# Patient Record
Sex: Female | Born: 1950 | Race: White | Hispanic: No | Smoking: Never smoker
Health system: Southern US, Community
[De-identification: ages and names within clinical notes are randomized; demographics above are authoritative.]

## PROBLEM LIST (undated history)

## (undated) DIAGNOSIS — H3530 Unspecified macular degeneration: Secondary | ICD-10-CM

## (undated) DIAGNOSIS — K219 Gastro-esophageal reflux disease without esophagitis: Secondary | ICD-10-CM

## (undated) DIAGNOSIS — K449 Diaphragmatic hernia without obstruction or gangrene: Secondary | ICD-10-CM

## (undated) DIAGNOSIS — R05 Cough: Secondary | ICD-10-CM

## (undated) DIAGNOSIS — D510 Vitamin B12 deficiency anemia due to intrinsic factor deficiency: Secondary | ICD-10-CM

## (undated) DIAGNOSIS — E042 Nontoxic multinodular goiter: Secondary | ICD-10-CM

## (undated) DIAGNOSIS — G4730 Sleep apnea, unspecified: Secondary | ICD-10-CM

## (undated) DIAGNOSIS — G2581 Restless legs syndrome: Secondary | ICD-10-CM

## (undated) DIAGNOSIS — F329 Major depressive disorder, single episode, unspecified: Secondary | ICD-10-CM

## (undated) DIAGNOSIS — R053 Chronic cough: Secondary | ICD-10-CM

## (undated) DIAGNOSIS — M1990 Unspecified osteoarthritis, unspecified site: Secondary | ICD-10-CM

## (undated) DIAGNOSIS — I10 Essential (primary) hypertension: Secondary | ICD-10-CM

## (undated) DIAGNOSIS — K635 Polyp of colon: Secondary | ICD-10-CM

## (undated) DIAGNOSIS — Z8619 Personal history of other infectious and parasitic diseases: Secondary | ICD-10-CM

## (undated) DIAGNOSIS — F32A Depression, unspecified: Secondary | ICD-10-CM

## (undated) DIAGNOSIS — J45909 Unspecified asthma, uncomplicated: Secondary | ICD-10-CM

## (undated) HISTORY — PX: THYROID SURGERY: SHX805

## (undated) HISTORY — PX: OTHER SURGICAL HISTORY: SHX169

## (undated) HISTORY — DX: Chronic cough: R05.3

## (undated) HISTORY — DX: Nontoxic multinodular goiter: E04.2

## (undated) HISTORY — DX: Unspecified macular degeneration: H35.30

## (undated) HISTORY — DX: Personal history of other infectious and parasitic diseases: Z86.19

## (undated) HISTORY — DX: Vitamin B12 deficiency anemia due to intrinsic factor deficiency: D51.0

## (undated) HISTORY — PX: TONSILLECTOMY: SUR1361

## (undated) HISTORY — DX: Polyp of colon: K63.5

## (undated) HISTORY — DX: Cough: R05

## (undated) HISTORY — DX: Restless legs syndrome: G25.81

## (undated) HISTORY — DX: Diaphragmatic hernia without obstruction or gangrene: K44.9

## (undated) HISTORY — DX: Depression, unspecified: F32.A

## (undated) HISTORY — DX: Unspecified asthma, uncomplicated: J45.909

## (undated) HISTORY — DX: Gastro-esophageal reflux disease without esophagitis: K21.9

## (undated) HISTORY — PX: NASAL SINUS SURGERY: SHX719

## (undated) HISTORY — PX: TUBAL LIGATION: SHX77

## (undated) HISTORY — DX: Unspecified osteoarthritis, unspecified site: M19.90

## (undated) HISTORY — DX: Major depressive disorder, single episode, unspecified: F32.9

## (undated) NOTE — Telephone Encounter (Signed)
 Formatting of this note might be different from the original. Last routine office visit-07/03/24 Future appointment pending-none  Return in about 3 months (around 10/03/2024) for chronic conditions .  Electronically signed by Derinda Seip, MA at 09/14/2024  9:07 EST

---

## 2016-05-24 DIAGNOSIS — M2021 Hallux rigidus, right foot: Secondary | ICD-10-CM | POA: Insufficient documentation

## 2016-05-24 DIAGNOSIS — G5760 Lesion of plantar nerve, unspecified lower limb: Secondary | ICD-10-CM | POA: Insufficient documentation

## 2018-05-17 ENCOUNTER — Encounter: Payer: Self-pay | Admitting: Internal Medicine

## 2018-09-26 ENCOUNTER — Other Ambulatory Visit: Payer: Self-pay | Admitting: Pulmonary Disease

## 2018-09-26 DIAGNOSIS — R0609 Other forms of dyspnea: Secondary | ICD-10-CM

## 2018-09-26 DIAGNOSIS — R053 Chronic cough: Secondary | ICD-10-CM

## 2018-09-26 DIAGNOSIS — R0602 Shortness of breath: Secondary | ICD-10-CM

## 2018-09-26 DIAGNOSIS — R05 Cough: Secondary | ICD-10-CM

## 2018-10-03 ENCOUNTER — Ambulatory Visit (HOSPITAL_COMMUNITY): Payer: Medicare Other

## 2018-10-03 ENCOUNTER — Ambulatory Visit
Admission: RE | Admit: 2018-10-03 | Discharge: 2018-10-03 | Disposition: A | Discharge status: Home / Self Care | Payer: Medicare Other | Source: Ambulatory Visit | Attending: Pulmonary Disease | Admitting: Pulmonary Disease

## 2018-10-03 DIAGNOSIS — R0602 Shortness of breath: Secondary | ICD-10-CM | POA: Diagnosis not present

## 2018-10-03 DIAGNOSIS — R053 Chronic cough: Secondary | ICD-10-CM

## 2018-10-03 DIAGNOSIS — R0609 Other forms of dyspnea: Secondary | ICD-10-CM

## 2018-10-03 DIAGNOSIS — R05 Cough: Secondary | ICD-10-CM | POA: Diagnosis not present

## 2018-10-03 LAB — POCT I-STAT CREATININE: Creatinine, Ser: 1 mg/dL (ref 0.44–1.00)

## 2018-10-03 MED ORDER — METHACHOLINE 16 MG/ML NEB SOLN
2.0000 mL | Freq: Once | RESPIRATORY_TRACT | Status: AC
  Filled 2018-10-03: qty 2

## 2018-10-03 MED ORDER — METHACHOLINE 1 MG/ML NEB SOLN
2.0000 mL | Freq: Once | RESPIRATORY_TRACT | Status: AC
  Filled 2018-10-03: qty 2

## 2018-10-03 MED ORDER — METHACHOLINE 0.25 MG/ML NEB SOLN
2.0000 mL | Freq: Once | RESPIRATORY_TRACT | Status: AC
  Filled 2018-10-03: qty 2

## 2018-10-03 MED ORDER — ALBUTEROL SULFATE (2.5 MG/3ML) 0.083% IN NEBU
2.5000 mg | INHALATION_SOLUTION | Freq: Once | RESPIRATORY_TRACT | Status: AC
  Administered 2018-10-03: 2.5 mg via RESPIRATORY_TRACT
  Filled 2018-10-03: qty 3

## 2018-10-03 MED ORDER — METHACHOLINE 4 MG/ML NEB SOLN
2.0000 mL | Freq: Once | RESPIRATORY_TRACT | Status: AC
  Filled 2018-10-03: qty 2

## 2018-10-03 MED ORDER — IOPAMIDOL (ISOVUE-300) INJECTION 61%
75.0000 mL | Freq: Once | INTRAVENOUS | Status: AC | PRN
  Administered 2018-10-03: 75 mL via INTRAVENOUS

## 2018-10-03 MED ORDER — METHACHOLINE 0.0625 MG/ML NEB SOLN
2.0000 mL | Freq: Once | RESPIRATORY_TRACT | Status: AC
  Administered 2018-10-03: 0.125 mg via RESPIRATORY_TRACT
  Filled 2018-10-03: qty 2

## 2018-10-03 MED ORDER — SODIUM CHLORIDE 0.9 % IN NEBU
3.0000 mL | INHALATION_SOLUTION | Freq: Once | RESPIRATORY_TRACT | Status: AC
  Administered 2018-10-03: 3 mL via RESPIRATORY_TRACT

## 2018-10-05 ENCOUNTER — Ambulatory Visit: Payer: Self-pay

## 2018-10-12 ENCOUNTER — Encounter: Payer: Self-pay | Admitting: Internal Medicine

## 2018-10-12 ENCOUNTER — Ambulatory Visit: Payer: Medicare Other | Admitting: Internal Medicine

## 2018-10-12 VITALS — BP 118/62 | HR 92 | Temp 98.6°F | Ht 65.0 in | Wt 246.6 lb

## 2018-10-12 DIAGNOSIS — M791 Myalgia, unspecified site: Secondary | ICD-10-CM | POA: Diagnosis not present

## 2018-10-12 DIAGNOSIS — R748 Abnormal levels of other serum enzymes: Secondary | ICD-10-CM | POA: Insufficient documentation

## 2018-10-12 DIAGNOSIS — E559 Vitamin D deficiency, unspecified: Secondary | ICD-10-CM | POA: Insufficient documentation

## 2018-10-12 DIAGNOSIS — R7303 Prediabetes: Secondary | ICD-10-CM | POA: Diagnosis not present

## 2018-10-12 DIAGNOSIS — M2550 Pain in unspecified joint: Secondary | ICD-10-CM

## 2018-10-12 DIAGNOSIS — R011 Cardiac murmur, unspecified: Secondary | ICD-10-CM | POA: Diagnosis not present

## 2018-10-12 DIAGNOSIS — Z1283 Encounter for screening for malignant neoplasm of skin: Secondary | ICD-10-CM | POA: Diagnosis not present

## 2018-10-12 DIAGNOSIS — D510 Vitamin B12 deficiency anemia due to intrinsic factor deficiency: Secondary | ICD-10-CM | POA: Insufficient documentation

## 2018-10-12 DIAGNOSIS — Z1389 Encounter for screening for other disorder: Secondary | ICD-10-CM

## 2018-10-12 DIAGNOSIS — I10 Essential (primary) hypertension: Secondary | ICD-10-CM | POA: Diagnosis not present

## 2018-10-12 DIAGNOSIS — Z13818 Encounter for screening for other digestive system disorders: Secondary | ICD-10-CM

## 2018-10-12 DIAGNOSIS — G2581 Restless legs syndrome: Secondary | ICD-10-CM

## 2018-10-12 DIAGNOSIS — E785 Hyperlipidemia, unspecified: Secondary | ICD-10-CM | POA: Insufficient documentation

## 2018-10-12 DIAGNOSIS — J45909 Unspecified asthma, uncomplicated: Secondary | ICD-10-CM

## 2018-10-12 LAB — URIC ACID: Uric Acid, Serum: 7 mg/dL (ref 2.4–7.0)

## 2018-10-12 LAB — COMPREHENSIVE METABOLIC PANEL
ALT: 15 U/L (ref 0–35)
AST: 17 U/L (ref 0–37)
Albumin: 4.2 g/dL (ref 3.5–5.2)
Alkaline Phosphatase: 117 U/L (ref 39–117)
BUN: 25 mg/dL — ABNORMAL HIGH (ref 6–23)
CHLORIDE: 102 meq/L (ref 96–112)
CO2: 29 mEq/L (ref 19–32)
Calcium: 9.3 mg/dL (ref 8.4–10.5)
Creatinine, Ser: 0.98 mg/dL (ref 0.40–1.20)
GFR: 56.36 mL/min — ABNORMAL LOW (ref >60.00)
Glucose, Bld: 97 mg/dL (ref 70–99)
Potassium: 4.1 mEq/L (ref 3.5–5.1)
Sodium: 140 mEq/L (ref 135–145)
Total Bilirubin: 0.4 mg/dL (ref 0.2–1.2)
Total Protein: 6.8 g/dL (ref 6.0–8.3)

## 2018-10-12 LAB — HEMOGLOBIN A1C: Hgb A1c MFr Bld: 5.6 % (ref 4.6–6.5)

## 2018-10-12 LAB — SEDIMENTATION RATE: Sed Rate: 40 mm/hr — ABNORMAL HIGH (ref 0–30)

## 2018-10-12 LAB — GAMMA GT: GGT: 13 U/L (ref 7–51)

## 2018-10-12 LAB — C-REACTIVE PROTEIN: CRP: 1.4 mg/dL (ref 0.5–20.0)

## 2018-10-12 MED ORDER — CYANOCOBALAMIN 1000 MCG/ML IJ SOLN: 1000.0000 ug | Freq: Once | INTRAMUSCULAR | 3 refills | Status: AC

## 2018-10-12 NOTE — Progress Notes (Signed)
Pre visit review using our clinic review tool, if applicable. No additional management support is needed unless otherwise documented below in the visit note. 

## 2018-10-12 NOTE — Patient Instructions (Addendum)
Vitamin D3 5000 IU daily     Heart Murmur A heart murmur is an extra sound that is caused by chaotic blood flow through the valves of the heart. The murmur can be heard as a "hum" or "whoosh" sound when blood flows through the heart. There are two types of heart murmurs:  Innocent (benign) murmurs. Most people with this type of heart murmur do not have a heart problem. Many children have innocent heart murmurs. Your health care provider may suggest some basic tests to find out whether your murmur is an innocent murmur. If an innocent heart murmur is found, there is no need for further tests or treatment and no need to restrict activities or stop playing sports.  Abnormal murmurs. These types of murmurs can occur in children and adults. Abnormal murmurs may be a sign of a more serious heart condition, such as a heart defect present at birth (congenital defect) or heart valve disease. What are the causes?  The heart has four areas called chambers. Valves separate the upper and lower chambers from each other (tricuspid valve and mitral valve) and separate the lower chambers of the heart from pathways that lead away from the heart (aortic valve and pulmonary valve). Normally, the valves open to let blood flow through or out of your heart, and then they shut to keep the blood from flowing backward. This condition is caused by heart valves that are not working properly.  In children, abnormal heart murmurs are typically caused by congenital defects.  In adults, abnormal murmurs are usually caused by heart valve problems from disease, infection, or aging. This condition may also be caused by:  Pregnancy.  Fever.  Overactive thyroid gland.  Anemia.  Exercise.  Rapid growth spurts (in children). What are the signs or symptoms? Innocent murmurs do not cause symptoms, and many people with abnormal murmurs may not have symptoms. If symptoms do develop, they may include:  Shortness of  breath.  Blue coloring of the skin, especially on the fingertips.  Chest pain.  Palpitations, or feeling a fluttering or skipped heartbeat.  Fainting.  Persistent cough.  Getting tired much faster than expected.  Swelling in the abdomen, feet, or ankles. How is this diagnosed? This condition may be diagnosed during a routine physical or other exam. If your health care provider hears a murmur with a stethoscope, he or she will listen for:  Where the murmur is located in your heart.  How long the murmur lasts (duration).  When the murmur is heard during the heartbeat.  How loud the murmur is. This may help the health care provider figure out what is causing the murmur. You may be referred to a heart specialist (cardiologist). You may also have other tests, including:  Electrocardiogram (ECG or EKG). This test measures the electrical activity of your heart.  Echocardiogram. This test uses high frequency sound waves to make pictures of your heart.  MRI or chest X-ray.  Cardiac catheterization. This test looks at blood flow through the arteries around the heart. For children and adults who have an abnormal heart murmur and want to stay active, it is important to:  Complete testing.  Review test results.  Receive recommendations from your health care provider. If heart disease is present, it may not be safe to play or be active. How is this treated? Heart murmurs themselves do not need treatment. In some cases, a heart murmur may go away on its own. If an underlying problem or disease is  causing the murmur, you may need treatment. If treatment is needed, it will depend on the type and severity of the disease or heart problem causing the murmur. Treatment may include:  Medicine.  Surgery.  Dietary and lifestyle changes. Follow these instructions at home:  Talk with your health care provider before participating in sports or other activities that require a lot of effort  and energy (are strenuous).  Learn as much as possible about your condition and any related diseases. Ask your health care provider if you may be at risk for any medical emergencies.  Talk with your health care provider about what symptoms you should look out for.  It is up to you to get your test results. Ask your health care provider, or the department that is doing the test, when your results will be ready.  Keep all follow-up visits as told by your health care provider. This is important. Contact a health care provider if:  You are frequently short of breath.  You feel more tired than usual.  You are having a hard time keeping up with normal activities or fitness routines.  You have swelling in your ankles or feet.  You notice that your heart often beats irregularly.  You develop any new symptoms. Get help right away if:  You have chest pain.  You are having trouble breathing.  You feel light-headed or you pass out.  Your symptoms suddenly get worse. These symptoms may represent a serious problem that is an emergency. Do not wait to see if the symptoms will go away. Get medical help right away. Call your local emergency services (911 in the [place] Do not drive yourself to the hospital. Summary  Normally, the heart valves open to let blood flow through or out of your heart, and then they shut to keep the blood from flowing backward.  A heart murmur is caused by heart valves that are not working properly.  You may need treatment if an underlying problem or disease is causing the heart murmur. Treatment may include medicine, surgery, or dietary and lifestyle changes.  Talk with your health care provider before participating in sports or other activities that require a lot of effort and energy (are strenuous).  Talk with your health care provider about what symptoms you should watch out for. This information is not intended to replace advice given to you by your health care  provider. Make sure you discuss any questions you have with your health care provider. Document Released: 09/23/2004 Document Revised: 02/07/2018 Document Reviewed: 02/07/2018 Elsevier Interactive Patient Education  [employer]2019 Elsevier Inc.

## 2018-10-12 NOTE — Progress Notes (Addendum)
Chief Complaint  Patient presents with  . Establish Care   New pt moved from [place]  1. Pernicious anemia needs refill of B12  2. HTN controlled on bencar 40-12.5 hctz  3. Reviewed labs 05/17/18 vit D def, hyperCA 10.4, and elevated alkaline phos  4. C/o diffuse joint pain and aches and easy bruising new w/in the last 6 months  5. C/o left hand itchy lesion to skin and mid back lesion she wants skin checked out     Review of Systems  Constitutional: Negative for weight loss.  HENT: Negative for hearing loss.   Eyes: Negative for blurred vision.  Respiratory: Positive for cough. Negative for sputum production.   Cardiovascular: Negative for chest pain.  Gastrointestinal: Negative for abdominal pain.  Musculoskeletal: Positive for joint pain and myalgias.  Skin: Negative for rash.       +skin lesions    Neurological: Negative for headaches.  Endo/Heme/Allergies: Bruises/bleeds easily.  Psychiatric/Behavioral: Negative for depression.   Past Medical History:  Diagnosis Date  . Arthritis   . Asthma   . Chronic cough   . Colon polyps   . Depression   . GERD (gastroesophageal reflux disease)   . Hiatal hernia   . History of chicken pox   . Macular degeneration   . Multinodular goiter    FNA in past neg h/o thyroid cysts  . Pernicious anemia   . RLS (restless legs syndrome)    Past Surgical History:  Procedure Laterality Date  . hiatal hernia surgery     2018  . NASAL SINUS SURGERY     2018  . TONSILLECTOMY     1961  . TUBAL LIGATION     Family History  Problem Relation Age of Onset  . Heart disease Mother   . Heart disease Father   . Hypertension Father   . Depression Father   . Alcohol abuse Father   . Cancer Maternal Grandmother        uterine, skin   . Arthritis Maternal Grandmother    Social History   Socioeconomic History  . Marital status: Married    Spouse name: Not on file  . Number of children: Not on file  . Years of education: Not on file  .  Highest education level: Not on file  Occupational History  . Not on file  Social Needs  . Financial resource strain: Not on file  . Food insecurity:    Worry: Not on file    Inability: Not on file  . Transportation needs:    Medical: Not on file    Non-medical: Not on file  Tobacco Use  . Smoking status: Never Smoker  . Smokeless tobacco: Never Used  Substance and Sexual Activity  . Alcohol use: Not Currently  . Drug use: Not Currently  . Sexual activity: Yes    Partners: Female  Lifestyle  . Physical activity:    Days per week: Not on file    Minutes per session: Not on file  . Stress: Not on file  Relationships  . Social connections:    Talks on phone: Not on file    Gets together: Not on file    Attends religious service: Not on file    Active member of club or organization: Not on file    Attends meetings of clubs or organizations: Not on file    Relationship status: Not on file  . Intimate partner violence:    Fear of current or ex partner:  Not on file    Emotionally abused: Not on file    Physically abused: Not on file    Forced sexual activity: Not on file  Other Topics Concern  . Not on file  Social History Narrative   Married    2 kids    2 years college    Retired IT trainer    No guns    Wears seat belt, safe in relationship    Current Meds  Medication Sig  . albuterol (PROVENTIL HFA;VENTOLIN HFA) 108 (90 Base) MCG/ACT inhaler Inhale into the lungs.  . Fluticasone-Salmeterol (ADVAIR) 100-50 MCG/DOSE AEPB Inhale 1 puff into the lungs 2 (two) times daily.   Marland Kitchen guaiFENesin-codeine (ROBITUSSIN AC) 100-10 MG/5ML syrup Take 5 mLs by mouth 3 (three) times daily as needed for cough.  Marland Kitchen LORazepam (ATIVAN) 0.5 MG tablet Take 0.5 mg by mouth 2 (two) times daily as needed.   . meloxicam (MOBIC) 15 MG tablet Take 15 mg by mouth daily.   . Misc Natural Products (GLUCOSAMINE CHOND COMPLEX/MSM PO) Take by mouth 2 (two) times daily.  . Multiple  Vitamins-Minerals (VITEYES AREDS FORMULA PO) Take by mouth 2 (two) times daily.   Marland Kitchen olmesartan-hydrochlorothiazide (BENICAR HCT) 40-12.5 MG tablet Take by mouth daily.   . pramipexole (MIRAPEX) 1.5 MG tablet Take by mouth daily.   . TURMERIC CURCUMIN PO Take by mouth daily.  Marland Kitchen venlafaxine XR (EFFEXOR-XR) 150 MG 24 hr capsule Take 150 mg by mouth daily with breakfast.   . VITAMIN D, CHOLECALCIFEROL, PO Take by mouth.  . [DISCONTINUED] Cholecalciferol (VITAMIN D3 GUMMIES) 25 MCG (1000 UT) CHEW Chew by mouth.   No Known Allergies Recent Results (from the past 2160 hour(s))  I-STAT creatinine     Status: None   Collection Time: 10/03/18  8:37 AM  Result Value Ref Range   Creatinine, Ser 1.00 0.44 - 1.00 mg/dL  Comprehensive metabolic panel     Status: Abnormal   Collection Time: 10/12/18 11:46 AM  Result Value Ref Range   Sodium 140 135 - 145 mEq/L   Potassium 4.1 3.5 - 5.1 mEq/L   Chloride 102 96 - 112 mEq/L   CO2 29 19 - 32 mEq/L   Glucose, Bld 97 70 - 99 mg/dL   BUN 25 (H) 6 - 23 mg/dL   Creatinine, Ser 0.98 0.40 - 1.20 mg/dL   Total Bilirubin 0.4 0.2 - 1.2 mg/dL   Alkaline Phosphatase 117 39 - 117 U/L   AST 17 0 - 37 U/L   ALT 15 0 - 35 U/L   Total Protein 6.8 6.0 - 8.3 g/dL   Albumin 4.2 3.5 - 5.2 g/dL   Calcium 9.3 8.4 - 10.5 mg/dL   GFR 56.36 (L) >60.00 mL/min  Hemoglobin A1c     Status: None   Collection Time: 10/12/18 11:46 AM  Result Value Ref Range   Hgb A1c MFr Bld 5.6 4.6 - 6.5 %    Comment: Glycemic Control Guidelines for People with Diabetes:Non Diabetic:  <6%Goal of Therapy: <7%Additional Action Suggested:  >8%   Hepatitis C antibody     Status: None   Collection Time: 10/12/18 11:46 AM  Result Value Ref Range   Hepatitis C Ab NON-REACTIVE NON-REACTI   SIGNAL TO CUT-OFF 0.01 <1.00    Comment: . HCV antibody was non-reactive. There is no laboratory  evidence of HCV infection. . In most cases, no further action is required. However, if recent HCV exposure is  suspected, a test for HCV  RNA (test code [id number]) is suggested. . For additional information please refer to http://education.[facility].com/faq/FAQ22v1 (This link is being provided for informational/ educational purposes only.) .   Rheumatoid Factor     Status: None   Collection Time: 10/12/18 11:46 AM  Result Value Ref Range   Rhuematoid fact SerPl-aCnc <14 <14 IU/mL  Antinuclear Antib (ANA)     Status: None   Collection Time: 10/12/18 11:46 AM  Result Value Ref Range   Anti Nuclear Antibody(ANA) NEGATIVE NEGATIVE    Comment: ANA IFA is a first line screen for detecting the presence of up to approximately 150 autoantibodies in various autoimmune diseases. A negative ANA IFA result suggests an ANA-associated autoimmune disease is not present at this time, but is not definitive. If there is high clinical suspicion for Sjogren's syndrome, testing for anti-SS-A/Ro antibody should be considered. Anti-Jo-1 antibody should be considered for clinically suspected inflammatory myopathies. . AC-0: Negative . International Consensus on ANA Patterns (https://www.hernandez-brewer.com/) . For additional information, please refer to http://education.[facility].com/faq/FAQ177 (This link is being provided for informational/ educational purposes only.) .   Sedimentation rate     Status: Abnormal   Collection Time: 10/12/18 11:46 AM  Result Value Ref Range   Sed Rate 40 (H) 0 - 30 mm/hr  C-reactive protein     Status: None   Collection Time: 10/12/18 11:46 AM  Result Value Ref Range   CRP 1.4 0.5 - 70.9 mg/dL  Cyclic citrul peptide antibody, IgG ([facility])     Status: None   Collection Time: 10/12/18 11:46 AM  Result Value Ref Range   Cyclic Citrullin Peptide Ab <16 UNITS    Comment: Reference Range Negative:            <20 Weak Positive:       20-39 Moderate Positive:   40-59 Strong Positive:     >59 .   Uric acid     Status: None   Collection Time: 10/12/18 11:46  AM  Result Value Ref Range   Uric Acid, Serum 7.0 2.4 - 7.0 mg/dL  Gamma GT     Status: None   Collection Time: 10/12/18 11:46 AM  Result Value Ref Range   GGT 13 7 - 51 U/L  Urinalysis, Routine w reflex microscopic     Status: Abnormal   Collection Time: 10/12/18 11:47 AM  Result Value Ref Range   Color, Urine YELLOW YELLOW   APPearance CLEAR CLEAR   Specific Gravity, Urine 1.019 1.001 - 1.03   pH 6.5 5.0 - 8.0   Glucose, UA NEGATIVE NEGATIVE   Bilirubin Urine NEGATIVE NEGATIVE   Ketones, ur NEGATIVE NEGATIVE   Hgb urine dipstick NEGATIVE NEGATIVE   Protein, ur NEGATIVE NEGATIVE   Nitrite NEGATIVE NEGATIVE   Leukocytes,Ua 1+ (A) NEGATIVE   WBC, UA 0-5 0 - 5 /HPF   RBC / HPF NONE SEEN 0 - 2 /HPF   Squamous Epithelial / LPF 0-5 < OR = 5 /HPF   Bacteria, UA NONE SEEN NONE SEEN /HPF   Hyaline Cast NONE SEEN NONE SEEN /LPF   Objective  Body mass index is 41.04 kg/m. Wt Readings from Last 3 Encounters:  10/12/18 246 lb 9.6 oz (111.9 kg)   Temp Readings from Last 3 Encounters:  10/12/18 98.6 F (37 C) (Oral)   BP Readings from Last 3 Encounters:  10/12/18 118/62   Pulse Readings from Last 3 Encounters:  10/12/18 92    Physical Exam Vitals signs and nursing note reviewed.  Constitutional:  Appearance: Normal appearance. She is well-developed and well-groomed.  HENT:     Head: Normocephalic and atraumatic.     Nose: Nose normal.     Mouth/Throat:     Mouth: Mucous membranes are moist.     Pharynx: Oropharynx is clear.  Eyes:     Conjunctiva/sclera: Conjunctivae normal.     Pupils: Pupils are equal, round, and reactive to light.  Cardiovascular:     Rate and Rhythm: Normal rate and regular rhythm.     Heart sounds: Murmur present.  Pulmonary:     Effort: Pulmonary effort is normal.     Breath sounds: Normal breath sounds.  Skin:    General: Skin is warm and dry.  Neurological:     General: No focal deficit present.     Mental Status: She is alert and  oriented to person, place, and time. Mental status is at baseline.     Gait: Gait normal.  Psychiatric:        Attention and Perception: Attention and perception normal.        Mood and Affect: Mood and affect normal.        Speech: Speech normal.        Behavior: Behavior normal. Behavior is cooperative.        Thought Content: Thought content normal.        Cognition and Memory: Cognition and memory normal.        Judgment: Judgment normal.     Assessment   1. HTN controlled/HLD pt declines statin  TC 228, TG 135, LDL 138, HDL 63 05/17/18  2. Pernicious anemia B12 507 05/17/18  3.Hypercalcemia may be med related 10.4 05/17/18  4. Elevated alk phos 127 05/17/18  5.Diffuse joint aches and pain and muscle aches and pains, easy bruising could be age related r/o autoimmune  6. Asthma stable with chronic cough reviewed CT chest no lung etiology f/u [facility] pulm Dr. Lanney Gins  7.H/o RLS  8. HM 9. Cardiac murmur  Plan   1. Cont meds  Declines statin for now  2. Refilled B12 reviewed labs  3. Recheck calcium today  Consider further labs if elevated r/o parathyroid etiology could be hctz  4. Repeat Alk phos and ggt  5. Check RA labs to r/u autoimmune d/o  6.  Cont inhalers and f/u pulm  7.  Monitor  8.  Get records Vxs at [facility] had all vxs per pt   Out of age window pap  Colonoscopy get records Dr. Angie Fava [place]  Mammogram get records old PCP in [place] get records Dr. Achille Rich in [facility]  Referred dermatology check lesion left hand and back Vitamin D def 22.8 9/18 /19 rec take D3 5000 IU daily  TSH 0.678 05/17/18, CBC normal, CMET elevated AP and Ca, lipid done.   Get records old PCP [place] seen 07/25/18 and 05/17/18  -received h/o depression/anxiety/stress  -no significant findings in records   Mild asthma/allergies/chronic cough  9. Echo  Provider: Dr. Olivia Mackie McLean-Scocuzza-[facility]

## 2018-10-13 LAB — URINALYSIS, ROUTINE W REFLEX MICROSCOPIC
Bacteria, UA: NONE SEEN /HPF
Bilirubin Urine: NEGATIVE
Glucose, UA: NEGATIVE
Hgb urine dipstick: NEGATIVE
Hyaline Cast: NONE SEEN /LPF
Ketones, ur: NEGATIVE
Nitrite: NEGATIVE
Protein, ur: NEGATIVE
RBC / HPF: NONE SEEN /HPF (ref 0–2)
Specific Gravity, Urine: 1.019 (ref 1.001–1.03)
pH: 6.5 (ref 5.0–8.0)

## 2018-10-13 LAB — HEPATITIS C ANTIBODY
HEP C AB: NONREACTIVE
SIGNAL TO CUT-OFF: 0.01 (ref <1.00)

## 2018-10-13 LAB — RHEUMATOID FACTOR: Rheumatoid fact SerPl-aCnc: <14 IU/mL (ref <14)

## 2018-10-13 LAB — CYCLIC CITRUL PEPTIDE ANTIBODY, IGG: Cyclic Citrullin Peptide Ab: <16 UNITS

## 2018-10-13 LAB — ANA: Anti Nuclear Antibody(ANA): NEGATIVE

## 2018-10-19 ENCOUNTER — Telehealth: Payer: Self-pay | Admitting: Internal Medicine

## 2018-10-19 NOTE — Telephone Encounter (Signed)
Pt would like to get lab results from 10/12/18. Thank you!  Call pt @ [phone].

## 2018-11-03 ENCOUNTER — Encounter: Payer: Self-pay | Admitting: Internal Medicine

## 2018-11-07 ENCOUNTER — Ambulatory Visit
Admission: RE | Admit: 2018-11-07 | Discharge: 2018-11-07 | Disposition: A | Discharge status: Home / Self Care | Payer: Medicare Other | Source: Ambulatory Visit | Attending: Internal Medicine | Admitting: Internal Medicine

## 2018-11-07 ENCOUNTER — Other Ambulatory Visit: Payer: Self-pay

## 2018-11-07 DIAGNOSIS — R011 Cardiac murmur, unspecified: Secondary | ICD-10-CM

## 2018-11-07 DIAGNOSIS — I10 Essential (primary) hypertension: Secondary | ICD-10-CM | POA: Insufficient documentation

## 2018-11-07 DIAGNOSIS — I340 Nonrheumatic mitral (valve) insufficiency: Secondary | ICD-10-CM | POA: Insufficient documentation

## 2018-11-07 DIAGNOSIS — J45909 Unspecified asthma, uncomplicated: Secondary | ICD-10-CM | POA: Diagnosis not present

## 2018-11-07 DIAGNOSIS — K219 Gastro-esophageal reflux disease without esophagitis: Secondary | ICD-10-CM | POA: Insufficient documentation

## 2018-11-07 NOTE — Progress Notes (Signed)
*  PRELIMINARY RESULTS* Echocardiogram 2D Echocardiogram has been performed.  Sharon Cervantes 11/07/2018, 10:41 AM

## 2018-11-16 ENCOUNTER — Telehealth: Payer: Self-pay

## 2018-11-16 NOTE — Telephone Encounter (Signed)
Does she want to go to [place] only there do they have females heart docs?

## 2018-11-16 NOTE — Telephone Encounter (Signed)
Copied from CRM [id number]. Topic: General - Other >> Nov 16, 2018 11:45 AM Herby Abraham C wrote: Reason for CRM: pt called in to check the status of a cardiology referral. Pt says that she received a call from the office stating that she need a referral.   CB: [phone]

## 2018-11-17 NOTE — Telephone Encounter (Signed)
Left message to call back- question about preference with referral.

## 2018-11-17 NOTE — Telephone Encounter (Signed)
Left message for patient to return call back. [facility] may give results and obtain information.  

## 2018-11-17 NOTE — Telephone Encounter (Signed)
Pt called back. She wants to see a doctor in [place]. She is fine with seeing a female.

## 2018-11-19 ENCOUNTER — Encounter: Payer: Self-pay | Admitting: Internal Medicine

## 2018-11-19 ENCOUNTER — Other Ambulatory Visit: Payer: Self-pay | Admitting: Internal Medicine

## 2018-11-19 DIAGNOSIS — R011 Cardiac murmur, unspecified: Secondary | ICD-10-CM | POA: Insufficient documentation

## 2018-11-19 DIAGNOSIS — I340 Nonrheumatic mitral (valve) insufficiency: Secondary | ICD-10-CM

## 2018-11-20 NOTE — Telephone Encounter (Signed)
Pt states she would prefer seeing the female provider in [place].  Please call pt to advise.   [phone]

## 2018-11-21 NOTE — Telephone Encounter (Signed)
For cards referral pt wants female cardiology doctor with [facility] in [facility]  Please change referral for [place] female cardiologist in [place]   Thanks TSM

## 2018-11-22 NOTE — Telephone Encounter (Signed)
Referral has been changed to [facility] office and notated that she wants a female provider.

## 2018-11-23 NOTE — Telephone Encounter (Signed)
Patient calling and states that she will take any cardiologist, female or female, at either location. Apologizes for going back and forth.

## 2018-11-23 NOTE — Telephone Encounter (Signed)
This is it we are not going back and forth already requested in [place]   TMS

## 2018-11-27 ENCOUNTER — Encounter: Payer: Self-pay | Admitting: Internal Medicine

## 2018-12-27 ENCOUNTER — Encounter: Payer: Self-pay | Admitting: Internal Medicine

## 2018-12-27 ENCOUNTER — Ambulatory Visit (INDEPENDENT_AMBULATORY_CARE_PROVIDER_SITE_OTHER): Payer: Medicare Other | Admitting: Internal Medicine

## 2018-12-27 DIAGNOSIS — J309 Allergic rhinitis, unspecified: Secondary | ICD-10-CM

## 2018-12-27 DIAGNOSIS — I10 Essential (primary) hypertension: Secondary | ICD-10-CM | POA: Diagnosis not present

## 2018-12-27 DIAGNOSIS — I340 Nonrheumatic mitral (valve) insufficiency: Secondary | ICD-10-CM

## 2018-12-27 DIAGNOSIS — Z1231 Encounter for screening mammogram for malignant neoplasm of breast: Secondary | ICD-10-CM

## 2018-12-27 MED ORDER — MONTELUKAST SODIUM 10 MG PO TABS: 10.0000 mg | ORAL_TABLET | Freq: Every day | ORAL | 0 refills | Status: DC

## 2018-12-27 NOTE — Progress Notes (Signed)
Virtual Visit via Video Note Doxy  I connected with Sharon Cervantes  on 12/27/18 at  9:40 AM EDT by a video enabled telemedicine application and verified that I am speaking with the correct person using two identifiers.  Location patient: home Location provider:work Persons participating in the virtual visit: patient, provider  I discussed the limitations of evaluation and management by telemedicine and the availability of in person appointments. The patient expressed understanding and agreed to proceed.   HPI: 1. HTN controlled she stopped benicar hct due to pulm thought was causing cough but did notice difference and legs began to swell so resumed and BP controlled  2. C/o PND, allergies on xyzal 5 mg qd prn flonase, ns daughter is PA and thinks she needs to take singulair again and she cant remember if it helped her    ROS: See pertinent positives and negatives per HPI.  Past Medical History:  Diagnosis Date  . Arthritis   . Asthma   . Chronic cough   . Colon polyps   . Depression   . GERD (gastroesophageal reflux disease)   . Hiatal hernia   . History of chicken pox   . Macular degeneration   . Multinodular goiter    FNA in past neg h/o thyroid cysts  . Pernicious anemia   . RLS (restless legs syndrome)     Past Surgical History:  Procedure Laterality Date  . hiatal hernia surgery     2018  . NASAL SINUS SURGERY     2018  . THYROID SURGERY     cyst removal  . TONSILLECTOMY     1961  . TUBAL LIGATION      Family History  Problem Relation Age of Onset  . Heart disease Mother   . Heart disease Father   . Hypertension Father   . Depression Father   . Alcohol abuse Father   . Cancer Maternal Grandmother        uterine, skin   . Arthritis Maternal Grandmother     SOCIAL HX: lives alone worked at [employer]    Current Outpatient Medications:  .  albuterol (PROVENTIL HFA;VENTOLIN HFA) 108 (90 Base) MCG/ACT inhaler, Inhale into the lungs., Disp: , Rfl:  .   Fluticasone-Salmeterol (ADVAIR) 100-50 MCG/DOSE AEPB, Inhale 1 puff into the lungs 2 (two) times daily. , Disp: , Rfl:  .  guaiFENesin-codeine (ROBITUSSIN AC) 100-10 MG/5ML syrup, Take 5 mLs by mouth 3 (three) times daily as needed for cough., Disp: , Rfl:  .  LORazepam (ATIVAN) 0.5 MG tablet, Take 0.5 mg by mouth 2 (two) times daily as needed. , Disp: , Rfl:  .  meloxicam (MOBIC) 15 MG tablet, Take 15 mg by mouth daily. , Disp: , Rfl:  .  Misc Natural Products (GLUCOSAMINE CHOND COMPLEX/MSM PO), Take by mouth 2 (two) times daily., Disp: , Rfl:  .  montelukast (SINGULAIR) 10 MG tablet, Take 1 tablet (10 mg total) by mouth at bedtime., Disp: 30 tablet, Rfl: 0 .  Multiple Vitamins-Minerals (VITEYES AREDS FORMULA PO), Take by mouth 2 (two) times daily. , Disp: , Rfl:  .  olmesartan-hydrochlorothiazide (BENICAR HCT) 40-12.5 MG tablet, Take by mouth daily. , Disp: , Rfl:  .  pramipexole (MIRAPEX) 1.5 MG tablet, Take by mouth daily. , Disp: , Rfl:  .  TURMERIC CURCUMIN PO, Take by mouth daily., Disp: , Rfl:  .  venlafaxine XR (EFFEXOR-XR) 150 MG 24 hr capsule, Take 150 mg by mouth daily with breakfast. , Disp: ,  Rfl:  .  VITAMIN D, CHOLECALCIFEROL, PO, Take by mouth., Disp: , Rfl:  No current facility-administered medications for this visit.   Facility-Administered Medications Ordered in Other Visits:  .  [COMPLETED] sodium chloride 0.9 % nebulizer solution 3 mL, 3 mL, Nebulization, Once, 3 mL at 10/03/18 1130 **FOLLOWED BY** [COMPLETED] methacholine (PROVOCHOLINE) inhaler solution 0.125 mg, 2 mL, Inhalation, Once, 0.125 mg at 10/03/18 1139 **FOLLOWED BY** methacholine (PROVOCHOLINE) inhaler solution 0.5 mg, 2 mL, Inhalation, Once **FOLLOWED BY** methacholine (PROVOCHOLINE) inhaler solution 2 mg, 2 mL, Inhalation, Once **FOLLOWED BY** methacholine (PROVOCHOLINE) inhaler solution 8 mg, 2 mL, Inhalation, Once **FOLLOWED BY** methacholine (PROVOCHOLINE) inhaler solution 32 mg, 2 mL, Inhalation, Once **FOLLOWED  BY** [COMPLETED] albuterol (PROVENTIL) (2.5 MG/3ML) 0.083% nebulizer solution 2.5 mg, 2.5 mg, Nebulization, Once, Karna Christmas, Fuad, MD, 2.5 mg at 10/03/18 1146  EXAM:  VITALS per patient if applicable:  GENERAL: alert, oriented, appears well and in no acute distress  HEENT: atraumatic, conjunttiva clear, no obvious abnormalities on inspection of external nose and ears  NECK: normal movements of the head and neck  LUNGS: on inspection no signs of respiratory distress, breathing rate appears normal, no obvious gross SOB, gasping or wheezing  CV: no obvious cyanosis  MS: moves all visible extremities without noticeable abnormality  PSYCH/NEURO: pleasant and cooperative, no obvious depression or anxiety, speech and thought processing grossly intact  ASSESSMENT AND PLAN:  Discussed the following assessment and plan:  Allergic rhinitis, unspecified seasonality, unspecified trigger - Plan: montelukast (SINGULAIR) 10 MG tablet cont xyzal 5 mg qhs prn and NS and Flonase and f/u pulm upcoming   Mitral valve insufficiency, unspecified etiology f/u cards 01/01/2019 Dr. Lady Gary  HTN-cont meds controlled   HM Get records Vxs at [facility] [place] had all vxs per pt   Out of age window pap  Colonoscopy get records Dr. Cherlynn Polo [place] due 05/2019 need to send release again in future Mammogram get records old PCP in [place] due due 9 or 05/2019 ordered mammogram DEXA get records Dr. Frutoso Schatz in [facility] [place] need to sign release in the future  Referred dermatology check lesion left hand and back appt was canceled but not rescheduled 2/2 COVID Vitamin D def 22.8 9/18 /19 rec take D3 5000 IU daily  TSH 0.678 05/17/18, CBC normal, CMET elevated AP and Ca, lipid done.   Get records old PCP [facility] seen 07/25/18 and 05/17/18  -received h/o depression/anxiety/stress  -no significant findings in records   Mild asthma/allergies/chronic cough    I discussed the assessment and  treatment plan with the patient. The patient was provided an opportunity to ask questions and all were answered. The patient agreed with the plan and demonstrated an understanding of the instructions.   The patient was advised to call back or seek an in-person evaluation if the symptoms worsen or if the condition fails to improve as anticipated.  Time spent 20 minutes  Bevelyn Buckles, MD

## 2019-01-08 ENCOUNTER — Telehealth: Payer: Self-pay | Admitting: Internal Medicine

## 2019-01-08 ENCOUNTER — Other Ambulatory Visit: Payer: Self-pay | Admitting: Internal Medicine

## 2019-01-08 DIAGNOSIS — F32A Depression, unspecified: Secondary | ICD-10-CM

## 2019-01-08 DIAGNOSIS — F419 Anxiety disorder, unspecified: Secondary | ICD-10-CM

## 2019-01-08 DIAGNOSIS — R002 Palpitations: Secondary | ICD-10-CM | POA: Insufficient documentation

## 2019-01-08 DIAGNOSIS — F329 Major depressive disorder, single episode, unspecified: Secondary | ICD-10-CM

## 2019-01-08 MED ORDER — VENLAFAXINE HCL ER 150 MG PO CP24: 150.0000 mg | ORAL_CAPSULE | Freq: Every day | ORAL | 3 refills | Status: DC

## 2019-01-08 NOTE — Telephone Encounter (Signed)
The recommended dose of Pramipexole for restless legs is 0.75 mg daily total  Is she willing to reduce her dose from 1.5 mg daily ?

## 2019-01-08 NOTE — Telephone Encounter (Signed)
Copied from CRM [id number]. Topic: Quick Communication - Rx Refill/Question >> Jan 08, 2019 12:07 PM Louie Bun, Rosey Bath D wrote: Medication: pramipexole (MIRAPEX) 1.5 MG tablet,  venlafaxine XR (EFFEXOR-XR) 150 MG 24 hr capsule Has the patient contacted their pharmacy? No new meds (Agent: If no, request that the patient contact the pharmacy for the refill.) (Agent: If yes, when and what did the pharmacy advise?)  Preferred Pharmacy (with phone number or street name): [facility] [address] [place], [place] - [address]  Agent: Please be advised that RX refills may take up to 3 business days. We ask that you follow-up with your pharmacy.

## 2019-01-09 ENCOUNTER — Telehealth: Payer: Self-pay | Admitting: *Deleted

## 2019-01-09 ENCOUNTER — Other Ambulatory Visit: Payer: Self-pay | Admitting: Internal Medicine

## 2019-01-09 DIAGNOSIS — G2581 Restless legs syndrome: Secondary | ICD-10-CM

## 2019-01-09 MED ORDER — PRAMIPEXOLE DIHYDROCHLORIDE 0.75 MG PO TABS: 0.7500 mg | ORAL_TABLET | Freq: Every day | ORAL | 3 refills | Status: DC

## 2019-01-09 NOTE — Telephone Encounter (Signed)
Is she willing to go down on the dose to rec dose for restless legs 0.75? Instead of 1.5 mg ?   TMS

## 2019-01-09 NOTE — Telephone Encounter (Signed)
Copied from CRM [id number]. Topic: Quick Communication - Rx Refill/Question >> Jan 09, 2019 11:39 AM Percival Spanish wrote: Medication pramipexole (MIRAPEX) 1.5 MG tablet   Preferred  [facility] [address]   Agent: Please be advised that RX refills may take up to 3 business days. We ask that you follow-up with your pharmacy.

## 2019-01-09 NOTE — Telephone Encounter (Signed)
Patient is ok with the change.

## 2019-01-16 ENCOUNTER — Encounter: Payer: Self-pay | Admitting: Internal Medicine

## 2019-01-31 ENCOUNTER — Other Ambulatory Visit: Payer: Self-pay

## 2019-01-31 ENCOUNTER — Ambulatory Visit: Payer: Medicare Other | Admitting: Allergy

## 2019-01-31 ENCOUNTER — Encounter: Payer: Self-pay | Admitting: Allergy

## 2019-01-31 VITALS — BP 120/78 | HR 84 | Temp 97.9°F | Resp 16 | Ht 63.58 in | Wt 253.4 lb

## 2019-01-31 DIAGNOSIS — R05 Cough: Secondary | ICD-10-CM

## 2019-01-31 DIAGNOSIS — R053 Chronic cough: Secondary | ICD-10-CM

## 2019-01-31 NOTE — Progress Notes (Signed)
New Patient Note  RE: Sharon Cervantes MRN: [id number] DOB: 12-13-1950 Date of Office Visit: 01/31/2019  Referring provider: No ref. provider found Primary care provider: McLean-Scocuzza, Pasty Spillers, MD  Chief Complaint: Cough (coughing that is waking her up at night); Wheezing; and Asthma  History of Present Illness: I had the pleasure of seeing Sharon Cervantes for initial evaluation at the [facility] on 02/02/2019. She is a 68 y.o. female, who is self-referred here for the evaluation of coughing. Patient is accompanied by her significant other.   Coughing: She reports symptoms of chest tightness, shortness of breath, coughing with clear phlegm, wheezing, nocturnal awakenings for 15 years. Current medications include Pulmicort 0.5mg  nebulizer once a day, Advair 250 1 puff BID and Flovent 220 2 puffs BID for the past 3-4 months with no benefit. She reports using aerochamber with inhalers. She tried the following inhalers: see above list. Main triggers are heat, cold air, smells.  In the last month, frequency of symptoms: daily. Frequency of nocturnal symptoms: daily. Frequency of SABA use: few times/week. Interference with physical activity: yes. Sleep is disturbed. In the last 12 months, emergency room visits/urgent care visits/doctor office visits or hospitalizations due to respiratory issues: no. In the last 12 months, oral steroids courses: 3-4 with good benefit. Lifetime history of hospitalization for respiratory issues: no. Prior intubations: no. History of pneumonia: no. She was evaluated by allergist/pulmonologist in the past. Smoking exposure: no. Up to date with flu vaccine: yes. Up to date with pneumonia vaccine: yes.  History of reflux: patient does take omeprazole daily for this.  Patient follows with pulmonology.  Patient had a hiatal hernia and had surgery for this in the past. She follows with cardiologist for HTN and mitral valve insufficiency.   Allergies:  She  reports symptoms of PND, itchy eyes, nasal congestion/rhinorrhea after going. Symptoms have been going on for 15 years. The symptoms are present all year around.  She has used Singulair, Xyzal, Flonase, Astelin with minimal improvement in symptoms. Sinus infections: no. Previous work up includes: skin testing a few years ago which was positive to some items - records not available for review. She was on allergy injections for about 1 year with no benefit. Previous ENT evaluation: had sinus surgery last year in [place] with no benefit. Previous sinus imaging: last year. Last eye exam: within the past year. History of nasal polyps: no.  CT chest 10/03/2018 IMPRESSION: No acute cardiopulmonary disease. Several small thyroid nodules. This likely reflects multinodular goiter but could be further evaluated with elective thyroid ultrasound. Small to moderate-sized hiatal hernia.  Assessment and Plan: Sharon Cervantes is a 68 y.o. female with: Chronic cough Chronic daily cough for 15 years. Currently on Pulmicort, Advair, Flovent with no benefit. Tried Singulair, xyzal, Flonase, Astelin and allergy immunotherapy for 1 year with minimal benefit. She is being followed by pulmonology and cardiology. She had previous sinus surgery with no benefit and hiatal hernia surgery.   Patient was under the impression that I was allergy and ENT. Discussed that I am an allergy specialist and not an ENT. Came to an agreement to repeat the skin testing to see if there any additional allergens perhaps contributing to her symptoms. The most common causes of chronic cough include the following: upper airway cough syndrome (UACS) which is caused by variety of rhinitis conditions; asthma; gastroesophageal reflux disease (GERD); chronic bronchitis from cigarette smoking or other inhaled environmental irritants; non-asthmatic eosinophilic bronchitis; and bronchiectasis.  In prospective studies,  these conditions have accounted for up to 94%  of the causes of chronic cough in immunocompetent adults.  The history and physical examination suggest that her cough is most likely multifactorial. Patient left the office visit without completing testing or reviewing recommendations. She declined any further care after HPI, ROS and physical exam was obtained.  No follow-ups on file.  Other allergy screening: Food allergy: no Medication allergy: no Hymenoptera allergy: no Urticaria: no Eczema:no History of recurrent infections suggestive of immunodeficency: no  Diagnostics: Patient left the office without completing testing.   Past Medical History: Patient Active Problem List   Diagnosis Date Noted   Chronic cough 02/02/2019   Mitral valve regurgitation 11/19/2018   Cardiac murmur 11/19/2018   Vitamin D deficiency 10/12/2018   Essential hypertension 10/12/2018   Pernicious anemia 10/12/2018   Elevated alkaline phosphatase level 10/12/2018   Hypercalcemia 10/12/2018   Asthma 10/12/2018   RLS (restless legs syndrome) 10/12/2018   Hyperlipidemia 10/12/2018   Past Medical History:  Diagnosis Date   Arthritis    Asthma    Chronic cough    Colon polyps    Depression    GERD (gastroesophageal reflux disease)    Hiatal hernia    History of chicken pox    Macular degeneration    Multinodular goiter    FNA in past neg h/o thyroid cysts   Pernicious anemia    RLS (restless legs syndrome)    Past Surgical History: Past Surgical History:  Procedure Laterality Date   hiatal hernia surgery     2018   NASAL SINUS SURGERY     2018   THYROID SURGERY     cyst removal   TONSILLECTOMY     1961   TUBAL LIGATION     Medication List:  Current Outpatient Medications  Medication Sig Dispense Refill   albuterol (PROVENTIL HFA;VENTOLIN HFA) 108 (90 Base) MCG/ACT inhaler Inhale into the lungs.     budesonide (PULMICORT) 0.5 MG/2ML nebulizer solution Inhale into the lungs.     fluticasone (FLONASE)  50 MCG/ACT nasal spray Place into the nose.     fluticasone (FLOVENT HFA) 220 MCG/ACT inhaler Inhale into the lungs.     Fluticasone-Salmeterol (ADVAIR) 100-50 MCG/DOSE AEPB Inhale 1 puff into the lungs 2 (two) times daily.      hydrochlorothiazide (HYDRODIURIL) 12.5 MG tablet Take by mouth.     levocetirizine (XYZAL) 5 MG tablet Take 5 mg by mouth every evening.     LORazepam (ATIVAN) 0.5 MG tablet Take 0.5 mg by mouth 2 (two) times daily as needed.      meloxicam (MOBIC) 15 MG tablet Take 15 mg by mouth daily.      Misc Natural Products (GLUCOSAMINE CHOND COMPLEX/MSM PO) Take by mouth 2 (two) times daily.     montelukast (SINGULAIR) 10 MG tablet Take 1 tablet (10 mg total) by mouth at bedtime. 30 tablet 0   pramipexole (MIRAPEX) 1.5 MG tablet Take 1.5 mg by mouth 3 (three) times daily.     TURMERIC CURCUMIN PO Take by mouth daily.     venlafaxine XR (EFFEXOR-XR) 150 MG 24 hr capsule Take 1 capsule (150 mg total) by mouth daily with breakfast. 90 capsule 3   VITAMIN D, CHOLECALCIFEROL, PO Take by mouth.     No current facility-administered medications for this visit.    Facility-Administered Medications Ordered in Other Visits  Medication Dose Route Frequency Provider Last Rate Last Dose   methacholine (PROVOCHOLINE) inhaler solution 0.5 mg  2  mL Inhalation Once Vida RiggerAleskerov, Fuad, MD       Followed by   methacholine (PROVOCHOLINE) inhaler solution 2 mg  2 mL Inhalation Once Vida RiggerAleskerov, Fuad, MD       Followed by   methacholine (PROVOCHOLINE) inhaler solution 8 mg  2 mL Inhalation Once Vida RiggerAleskerov, Fuad, MD       Followed by   methacholine (PROVOCHOLINE) inhaler solution 32 mg  2 mL Inhalation Once Vida RiggerAleskerov, Fuad, MD       Allergies: No Known Allergies Social History: Social History   Socioeconomic History   Marital status: Married    Spouse name: Not on file   Number of children: Not on file   Years of education: Not on file   Highest education level: Not on file    Occupational History   Not on file  Social Needs   Financial resource strain: Not on file   Food insecurity:    Worry: Not on file    Inability: Not on file   Transportation needs:    Medical: Not on file    Non-medical: Not on file  Tobacco Use   Smoking status: Never Smoker   Smokeless tobacco: Never Used  Substance and Sexual Activity   Alcohol use: Not Currently   Drug use: Not Currently   Sexual activity: Yes    Partners: Female  Lifestyle   Physical activity:    Days per week: Not on file    Minutes per session: Not on file   Stress: Not on file  Relationships   Social connections:    Talks on phone: Not on file    Gets together: Not on file    Attends religious service: Not on file    Active member of club or organization: Not on file    Attends meetings of clubs or organizations: Not on file    Relationship status: Not on file  Other Topics Concern   Not on file  Social History Narrative   Married    2 kids    2 years college    Retired [employer] office    No guns    Wears seat belt, safe in relationship    Lives in a apartment. Smoking: denies Occupation: retired   Landscape architectnvironmental HistorySurveyor, minerals: Water Damage/mildew in the house: no Engineer, civil (consulting)Carpet in the family room: no Carpet in the bedroom: yes Heating: electric Cooling: central Pet: yes 2 dogs x 8 yrs, 6 months  Family History: Family History  Problem Relation Age of Onset   Heart disease Mother    Heart disease Father    Hypertension Father    Depression Father    Alcohol abuse Father    Cancer Maternal Grandmother        uterine, skin    Arthritis Maternal Grandmother    Problem                               Relation Asthma                                   Son  Eczema                                No  Food allergy  No  Allergic rhino conjunctivitis     Son   Review of Systems  Constitutional: Negative for appetite change, chills, fever and  unexpected weight change.  HENT: Positive for postnasal drip. Negative for congestion and rhinorrhea.   Eyes: Negative for itching.  Respiratory: Positive for cough. Negative for chest tightness, shortness of breath and wheezing.   Cardiovascular: Negative for chest pain.  Gastrointestinal: Negative for abdominal pain.  Genitourinary: Negative for difficulty urinating.  Skin: Negative for rash.  Allergic/Immunologic: Positive for environmental allergies. Negative for food allergies.  Neurological: Negative for headaches.   Objective: BP 120/78 (BP Location: Left Arm, Patient Position: Sitting, Cuff Size: Normal)    Pulse 84    Temp 97.9 F (36.6 C) (Oral)    Resp 16    Ht 5' 3.58" (1.615 m)    Wt 253 lb 6.4 oz (114.9 kg)    SpO2 96%    BMI 44.07 kg/m  Body mass index is 44.07 kg/m. Physical Exam  Constitutional: She is oriented to person, place, and time. She appears well-developed and well-nourished.  HENT:  Head: Normocephalic and atraumatic.  Right Ear: External ear normal.  Left Ear: External ear normal.  Nose: Nose normal.  Mouth/Throat: Oropharynx is clear and moist.  Eyes: Conjunctivae and EOM are normal.  Neck: Neck supple.  Cardiovascular: Normal rate, regular rhythm and normal heart sounds. Exam reveals no gallop and no friction rub.  No murmur heard. Pulmonary/Chest: Effort normal and breath sounds normal. She has no wheezes. She has no rales.  Abdominal: Soft. Bowel sounds are normal. There is no abdominal tenderness.  Lymphadenopathy:    She has no cervical adenopathy.  Neurological: She is alert and oriented to person, place, and time.  Skin: Skin is warm. No rash noted.  Psychiatric: She has a normal mood and affect. Her behavior is normal.  Nursing note and vitals reviewed.  Sincerely,  Wyline Mood, DO Allergy & Immunology  [facility] office: [phone] [facility] office: [phone]

## 2019-02-02 ENCOUNTER — Encounter: Payer: Self-pay | Admitting: Cardiovascular Disease

## 2019-02-02 ENCOUNTER — Encounter: Payer: Self-pay | Admitting: Allergy

## 2019-02-02 DIAGNOSIS — R05 Cough: Secondary | ICD-10-CM | POA: Insufficient documentation

## 2019-02-02 DIAGNOSIS — R053 Chronic cough: Secondary | ICD-10-CM | POA: Insufficient documentation

## 2019-02-02 NOTE — Assessment & Plan Note (Signed)
Chronic daily cough for 15 years. Currently on Pulmicort, Advair, Flovent with no benefit. Tried Singulair, xyzal, Flonase, Astelin and allergy immunotherapy for 1 year with minimal benefit. She is being followed by pulmonology and cardiology. She had previous sinus surgery with no benefit and hiatal hernia surgery.   Patient was under the impression that I was allergy and ENT. Discussed that I am an allergy specialist and not an ENT. Came to an agreement to repeat the skin testing to see if there any additional allergens perhaps contributing to her symptoms. The most common causes of chronic cough include the following: upper airway cough syndrome (UACS) which is caused by variety of rhinitis conditions; asthma; gastroesophageal reflux disease (GERD); chronic bronchitis from cigarette smoking or other inhaled environmental irritants; non-asthmatic eosinophilic bronchitis; and bronchiectasis.  In prospective studies, these conditions have accounted for up to 94% of the causes of chronic cough in immunocompetent adults.  The history and physical examination suggest that her cough is most likely multifactorial. Patient left the office visit without completing testing or reviewing recommendations. She declined any further care after HPI, ROS and physical exam was obtained.

## 2019-02-07 DIAGNOSIS — G4733 Obstructive sleep apnea (adult) (pediatric): Secondary | ICD-10-CM | POA: Insufficient documentation

## 2019-05-17 ENCOUNTER — Encounter: Payer: Self-pay | Admitting: Internal Medicine

## 2019-05-21 ENCOUNTER — Encounter: Payer: Self-pay | Admitting: Family Medicine

## 2019-05-21 ENCOUNTER — Ambulatory Visit (INDEPENDENT_AMBULATORY_CARE_PROVIDER_SITE_OTHER): Payer: Medicare Other | Admitting: Family Medicine

## 2019-05-21 ENCOUNTER — Other Ambulatory Visit: Payer: Self-pay

## 2019-05-21 VITALS — BP 112/64 | HR 92 | Temp 96.4°F | Resp 18 | Ht 65.0 in | Wt 270.4 lb

## 2019-05-21 DIAGNOSIS — M62838 Other muscle spasm: Secondary | ICD-10-CM

## 2019-05-21 DIAGNOSIS — M545 Low back pain, unspecified: Secondary | ICD-10-CM

## 2019-05-21 MED ORDER — METHYLPREDNISOLONE ACETATE 40 MG/ML IJ SUSP
40.0000 mg | Freq: Once | INTRAMUSCULAR | Status: AC
  Administered 2019-05-21: 40 mg via INTRAMUSCULAR

## 2019-05-21 MED ORDER — KETOROLAC TROMETHAMINE 60 MG/2ML IM SOLN
60.0000 mg | Freq: Once | INTRAMUSCULAR | Status: AC
  Administered 2019-05-21: 12:00:00 60 mg via INTRAMUSCULAR

## 2019-05-21 MED ORDER — CYCLOBENZAPRINE HCL 5 MG PO TABS: 5.0000 mg | ORAL_TABLET | Freq: Three times a day (TID) | ORAL | 1 refills | Status: DC | PRN

## 2019-05-21 NOTE — Progress Notes (Signed)
Subjective:    Patient ID: Sharon Cervantes, female    DOB: 1951/07/11, 68 y.o.   MRN: [id number]  HPI   Patient presents to clinic complaining of bilateral low back pain.  States she notices it mainly when walking and changing position from sit to stand.  Denies any known injury or any falls.  Describes it like a muscle ache/muscle spasm.  Denies lower extremity numbness, weakness, loss of bowel or bladder control, fever or chills or any saddle anesthesia.  Patient Active Problem List   Diagnosis Date Noted  . Chronic cough 02/02/2019  . Mitral valve regurgitation 11/19/2018  . Cardiac murmur 11/19/2018  . Vitamin D deficiency 10/12/2018  . Essential hypertension 10/12/2018  . Pernicious anemia 10/12/2018  . Elevated alkaline phosphatase level 10/12/2018  . Hypercalcemia 10/12/2018  . Asthma 10/12/2018  . RLS (restless legs syndrome) 10/12/2018  . Hyperlipidemia 10/12/2018   Social History   Tobacco Use  . Smoking status: Never Smoker  . Smokeless tobacco: Never Used  Substance Use Topics  . Alcohol use: Not Currently   Review of Systems  Constitutional: Negative for chills, fatigue and fever.  HENT: Negative for congestion, ear pain, sinus pain and sore throat.   Eyes: Negative.   Respiratory: Negative for cough, shortness of breath and wheezing.   Cardiovascular: Negative for chest pain, palpitations and leg swelling.  Gastrointestinal: Negative for abdominal pain, diarrhea, nausea and vomiting.  Genitourinary: Negative for dysuria, frequency and urgency.  Musculoskeletal: +low back pain Skin: Negative for color change, pallor and rash.  Neurological: Negative for syncope, light-headedness and headaches.  Psychiatric/Behavioral: The patient is not nervous/anxious.       Objective:   Physical Exam Vitals signs and nursing note reviewed.  Constitutional:      General: She is not in acute distress.    Appearance: She is obese. She is not ill-appearing, toxic-appearing  or diaphoretic.  HENT:     Head: Normocephalic and atraumatic.  Cardiovascular:     Rate and Rhythm: Normal rate and regular rhythm.     Heart sounds: Normal heart sounds.  Pulmonary:     Effort: Pulmonary effort is normal.     Breath sounds: Normal breath sounds.  Musculoskeletal:       Back:     Comments: Location of back pain represented by Green square on diagram.  Quadricep strength equal and strong.  Pulling pain with straight leg raises and bending forward.  Dorsi plantarflexion equal and strong.  Skin:    General: Skin is warm and dry.     Coloration: Skin is not jaundiced or pale.     Findings: No bruising or erythema.  Neurological:     General: No focal deficit present.     Mental Status: She is alert and oriented to person, place, and time.     Motor: No weakness.     Gait: Gait normal.  Psychiatric:        Mood and Affect: Mood normal.        Behavior: Behavior normal.       Today's Vitals   05/21/19 1128  BP: 112/64  Pulse: 92  Resp: 18  Temp: (!) 96.4 F (35.8 C)  TempSrc: Temporal  SpO2: 96%  Weight: 270 lb 6.4 oz (122.7 kg)  Height: 5\' 5"  (1.651 m)   Body mass index is 45 kg/m.     Assessment & Plan:   Patient will get IM Toradol and methylprednisolone x1 to help jump start pain relief.  She will use Flexeril as needed for muscle relaxation.  Discussed topical rubs like BenGay, Biofreeze and heating pads to help reduce pain.  Also recommended stretching and range of motion exercises.  Advised to call office right away if pain worsens or she develops any alarm symptoms including lower extremity numbness, loss of bowel or bladder control, saddle anesthesia.  1. Acute bilateral low back pain without sciatica  - cyclobenzaprine (FLEXERIL) 5 MG tablet; Take 1 tablet (5 mg total) by mouth 3 (three) times daily as needed for muscle spasms.  Dispense: 30 tablet; Refill: 1  2. Muscle spasm  - cyclobenzaprine (FLEXERIL) 5 MG tablet; Take 1 tablet (5 mg  total) by mouth 3 (three) times daily as needed for muscle spasms.  Dispense: 30 tablet; Refill: 1 - ketorolac (TORADOL) injection 60 mg - methylPREDNISolone acetate (DEPO-MEDROL) injection 40 mg   She will keep regular appts with PCP. She will return to clinic sooner if any issues arise.

## 2019-05-21 NOTE — Patient Instructions (Signed)

## 2019-07-06 ENCOUNTER — Ambulatory Visit: Payer: Medicare Other | Admitting: Internal Medicine

## 2019-07-17 DIAGNOSIS — I272 Pulmonary hypertension, unspecified: Secondary | ICD-10-CM | POA: Insufficient documentation

## 2019-08-22 ENCOUNTER — Other Ambulatory Visit: Payer: Self-pay | Admitting: Orthopedic Surgery

## 2019-08-22 DIAGNOSIS — M1711 Unilateral primary osteoarthritis, right knee: Secondary | ICD-10-CM

## 2019-09-25 ENCOUNTER — Other Ambulatory Visit: Payer: Self-pay | Admitting: Internal Medicine

## 2019-09-25 DIAGNOSIS — J309 Allergic rhinitis, unspecified: Secondary | ICD-10-CM

## 2019-09-26 MED ORDER — MONTELUKAST SODIUM 10 MG PO TABS: 10.0000 mg | ORAL_TABLET | Freq: Every day | ORAL | 0 refills | Status: DC

## 2019-10-01 ENCOUNTER — Encounter: Payer: Self-pay | Admitting: Emergency Medicine

## 2019-10-01 ENCOUNTER — Other Ambulatory Visit: Payer: Self-pay

## 2019-10-01 ENCOUNTER — Ambulatory Visit
Admission: EM | Admit: 2019-10-01 | Discharge: 2019-10-01 | Disposition: A | Discharge status: Home / Self Care | Payer: Medicare Other

## 2019-10-01 DIAGNOSIS — M62838 Other muscle spasm: Secondary | ICD-10-CM | POA: Diagnosis not present

## 2019-10-01 MED ORDER — CYCLOBENZAPRINE HCL 10 MG PO TABS: 10.0000 mg | ORAL_TABLET | Freq: Two times a day (BID) | ORAL | 0 refills | Status: DC | PRN

## 2019-10-01 MED ORDER — IBUPROFEN 800 MG PO TABS: 800.0000 mg | ORAL_TABLET | Freq: Three times a day (TID) | ORAL | 0 refills | Status: DC | PRN

## 2019-10-01 NOTE — Discharge Instructions (Signed)
Take the prescribed ibuprofen as needed for your pain.  Take the muscle relaxer Flexeril as needed for muscle spasm; do not drive, operate machinery, or drink alcohol with this medication as it may make you drowsy.    Follow up with your primary care provider if your symptoms are not improving.     

## 2019-10-01 NOTE — ED Provider Notes (Signed)
Roderic Palau    CSN: [id number] Arrival date & time: 10/01/19  1034      History   Chief Complaint Chief Complaint  Patient presents with  . Neck Pain    right    HPI Sharon Cervantes is a 69 y.o. female.   Patient presents with 3-day history of right side neck pain.  She describes this as a "crack" and feels like a muscle spasm; 8/10; worse with movement, improves with rest.  No falls or injury.  She denies weakness, numbness, fever, chills, rash, sore throat, cough, shortness of breath, vomiting, diarrhea, or other symptoms.  Treatment attempted at home with ibuprofen and Flexeril but has run out of the Flexeril.    The history is provided by the patient.    Past Medical History:  Diagnosis Date  . Arthritis   . Asthma   . Chronic cough   . Colon polyps   . Depression   . GERD (gastroesophageal reflux disease)   . Hiatal hernia   . History of chicken pox   . Macular degeneration   . Multinodular goiter    FNA in past neg h/o thyroid cysts  . Pernicious anemia   . RLS (restless legs syndrome)     Patient Active Problem List   Diagnosis Date Noted  . Chronic cough 02/02/2019  . Mitral valve regurgitation 11/19/2018  . Cardiac murmur 11/19/2018  . Vitamin D deficiency 10/12/2018  . Essential hypertension 10/12/2018  . Pernicious anemia 10/12/2018  . Elevated alkaline phosphatase level 10/12/2018  . Hypercalcemia 10/12/2018  . Asthma 10/12/2018  . RLS (restless legs syndrome) 10/12/2018  . Hyperlipidemia 10/12/2018    Past Surgical History:  Procedure Laterality Date  . hiatal hernia surgery     2018  . NASAL SINUS SURGERY     2018  . THYROID SURGERY     cyst removal  . TONSILLECTOMY     1961  . TUBAL LIGATION      OB History   No obstetric history on file.      Home Medications    Prior to Admission medications   Medication Sig Start Date End Date Taking? Authorizing Provider  albuterol (PROVENTIL HFA;VENTOLIN HFA) 108 (90 Base) MCG/ACT  inhaler Inhale into the lungs.   Yes [provider]  budesonide (PULMICORT) 0.5 MG/2ML nebulizer solution Inhale into the lungs. 01/10/19 01/10/20 Yes [provider]  fluticasone (FLONASE) 50 MCG/ACT nasal spray Place into the nose. 10/23/18 10/23/19 Yes [provider]  fluticasone (FLOVENT HFA) 220 MCG/ACT inhaler Inhale into the lungs. 10/23/18 10/23/19 Yes [provider]  Fluticasone-Salmeterol (ADVAIR) 100-50 MCG/DOSE AEPB Inhale 1 puff into the lungs 2 (two) times daily.    Yes [provider]  hydrochlorothiazide (HYDRODIURIL) 12.5 MG tablet Take by mouth. 01/31/19 01/31/20 Yes [provider]  montelukast (SINGULAIR) 10 MG tablet Take 1 tablet (10 mg total) by mouth at bedtime. 09/26/19  Yes McLean-Scocuzza, Nino Glow, MD  Multiple Vitamins-Minerals (ICAPS AREDS 2 PO) Take 1 capsule by mouth 2 (two) times daily.   Yes [provider]  olmesartan-hydrochlorothiazide (BENICAR HCT) 40-12.5 MG tablet Take 1 tablet by mouth daily. 05/28/19  Yes [provider]  pramipexole (MIRAPEX) 1.5 MG tablet Take 1.5 mg by mouth 3 (three) times daily.   Yes [provider]  venlafaxine XR (EFFEXOR-XR) 150 MG 24 hr capsule Take 1 capsule (150 mg total) by mouth daily with breakfast. 01/08/19  Yes McLean-Scocuzza, Nino Glow, MD  VITAMIN A PO  Take 1 tablet by mouth daily.   Yes [provider]  cyclobenzaprine (FLEXERIL) 10 MG tablet Take 1 tablet (10 mg total) by mouth 2 (two) times daily as needed for muscle spasms. 10/01/19   Mickie Bail, NP  ibuprofen (ADVIL) 800 MG tablet Take 1 tablet (800 mg total) by mouth every 8 (eight) hours as needed. 10/01/19   Mickie Bail, NP  levocetirizine (XYZAL) 5 MG tablet Take 5 mg by mouth every evening.    [provider]  LORazepam (ATIVAN) 0.5 MG tablet Take 0.5 mg by mouth 2 (two) times daily as needed.     [provider]  meloxicam (MOBIC) 15 MG tablet Take 15 mg by mouth  daily.     [provider]  Misc Natural Products (GLUCOSAMINE CHOND COMPLEX/MSM PO) Take by mouth 2 (two) times daily.    [provider]  TURMERIC CURCUMIN PO Take by mouth daily.    [provider]  VITAMIN D, CHOLECALCIFEROL, PO Take by mouth.    [provider]    Family History Family History  Problem Relation Age of Onset  . Heart disease Mother   . Heart disease Father   . Hypertension Father   . Depression Father   . Alcohol abuse Father   . Cancer Maternal Grandmother        uterine, skin   . Arthritis Maternal Grandmother     Social History Social History   Tobacco Use  . Smoking status: Never Smoker  . Smokeless tobacco: Never Used  Substance Use Topics  . Alcohol use: Not Currently  . Drug use: Not Currently     Allergies   Patient has no known allergies.   Review of Systems Review of Systems  Constitutional: Negative for chills and fever.  HENT: Negative for ear pain and sore throat.   Eyes: Negative for pain and visual disturbance.  Respiratory: Negative for cough and shortness of breath.   Cardiovascular: Negative for chest pain and palpitations.  Gastrointestinal: Negative for abdominal pain and vomiting.  Genitourinary: Negative for dysuria and hematuria.  Musculoskeletal: Positive for neck pain. Negative for arthralgias and back pain.  Skin: Negative for color change and rash.  Neurological: Negative for seizures, syncope, weakness and numbness.  All other systems reviewed and are negative.    Physical Exam Triage Vital Signs ED Triage Vitals  Enc Vitals Group     BP      Pulse      Resp      Temp      Temp src      SpO2      Weight      Height      Head Circumference      Peak Flow      Pain Score      Pain Loc      Pain Edu?      Excl. in GC?    No data found.  Updated Vital Signs BP 130/84 (BP Location: Left Arm)   Pulse 79   Temp 98.1 F (36.7 C) (Oral)   Resp 18   Ht 5\' 5"  (1.651  m)   Wt 265 lb (120.2 kg)   SpO2 97%   BMI 44.10 kg/m   Visual Acuity Right Eye Distance:   Left Eye Distance:   Bilateral Distance:    Right Eye Near:   Left Eye Near:    Bilateral Near:     Physical Exam Vitals and nursing  note reviewed.  Constitutional:      General: She is not in acute distress.    Appearance: She is well-developed. She is not ill-appearing.  HENT:     Head: Normocephalic and atraumatic.     Right Ear: Tympanic membrane normal.     Left Ear: Tympanic membrane normal.     Nose: Nose normal.     Mouth/Throat:     Mouth: Mucous membranes are moist.     Pharynx: Oropharynx is clear.  Eyes:     Conjunctiva/sclera: Conjunctivae normal.  Cardiovascular:     Rate and Rhythm: Normal rate and regular rhythm.     Heart sounds: No murmur.  Pulmonary:     Effort: Pulmonary effort is normal. No respiratory distress.     Breath sounds: Normal breath sounds. No wheezing or rhonchi.  Abdominal:     General: Bowel sounds are normal.     Palpations: Abdomen is soft.     Tenderness: There is no abdominal tenderness. There is no guarding or rebound.  Musculoskeletal:        General: Tenderness present. No swelling, deformity or signs of injury. Normal range of motion.     Cervical back: Neck supple.     Comments: Right trapezius muscle tender to palpation.    Skin:    General: Skin is warm and dry.     Capillary Refill: Capillary refill takes less than 2 seconds.     Findings: No bruising, erythema, lesion or rash.  Neurological:     General: No focal deficit present.     Mental Status: She is alert and oriented to person, place, and time.     Sensory: No sensory deficit.     Motor: No weakness.     Gait: Gait normal.  Psychiatric:        Mood and Affect: Mood normal.        Behavior: Behavior normal.      UC Treatments / Results  Labs (all labs ordered are listed, but only abnormal results are displayed) Labs Reviewed - No data to display  EKG    Radiology No results found.  Procedures Procedures (including critical care time)  Medications Ordered in UC Medications - No data to display  Initial Impression / Assessment and Plan / UC Course  I have reviewed the triage vital signs and the nursing notes.  Pertinent labs & imaging results that were available during my care of the patient were reviewed by me and considered in my medical decision making (see chart for details).    Muscle spasm of the right trapezius.  Treating with ibuprofen and Flexeril.  Precautions for drowsiness with Flexeril discussed with patient.  Instructed her to follow-up with her PCP if her symptoms are not improving.  Patient agrees to plan of care.     Final Clinical Impressions(s) / UC Diagnoses   Final diagnoses:  Muscle spasm     Discharge Instructions     Take the prescribed ibuprofen as needed for your pain.  Take the muscle relaxer Flexeril as needed for muscle spasm; do not drive, operate machinery, or drink alcohol with this medication as it may make you drowsy.    Follow-up with your primary care provider if your symptoms are not improving.         ED Prescriptions    Medication Sig Dispense Auth. Provider   ibuprofen (ADVIL) 800 MG tablet Take 1 tablet (800 mg total) by mouth every 8 (eight) hours as needed.  21 tablet Mickie Bail, NP   cyclobenzaprine (FLEXERIL) 10 MG tablet Take 1 tablet (10 mg total) by mouth 2 (two) times daily as needed for muscle spasms. 20 tablet Mickie Bail, NP     I have reviewed the PDMP during this encounter.   Mickie Bail, NP 10/01/19 1100

## 2019-10-01 NOTE — ED Triage Notes (Signed)
Patient in today c/o right sided neck pain x 3 days. No injury noted. Patient has tried OTC Ibuprofen and Flexeril.

## 2019-10-10 ENCOUNTER — Encounter: Payer: Medicare Other | Attending: Pulmonary Disease | Admitting: *Deleted

## 2019-10-10 ENCOUNTER — Other Ambulatory Visit: Payer: Self-pay

## 2019-10-10 DIAGNOSIS — J4550 Severe persistent asthma, uncomplicated: Secondary | ICD-10-CM

## 2019-10-10 DIAGNOSIS — J45909 Unspecified asthma, uncomplicated: Secondary | ICD-10-CM | POA: Insufficient documentation

## 2019-10-10 DIAGNOSIS — I272 Pulmonary hypertension, unspecified: Secondary | ICD-10-CM | POA: Insufficient documentation

## 2019-10-10 NOTE — Progress Notes (Signed)
Completed virtual orientation today.  EP eval scheduled for 2/11 at 11am.

## 2019-10-11 ENCOUNTER — Other Ambulatory Visit: Payer: Self-pay

## 2019-10-11 ENCOUNTER — Encounter: Payer: Medicare Other | Admitting: *Deleted

## 2019-10-11 VITALS — Ht 66.75 in | Wt 273.9 lb

## 2019-10-11 DIAGNOSIS — J4550 Severe persistent asthma, uncomplicated: Secondary | ICD-10-CM

## 2019-10-11 DIAGNOSIS — J45909 Unspecified asthma, uncomplicated: Secondary | ICD-10-CM | POA: Diagnosis not present

## 2019-10-11 DIAGNOSIS — I272 Pulmonary hypertension, unspecified: Secondary | ICD-10-CM

## 2019-10-11 NOTE — Patient Instructions (Signed)
Patient Instructions  Patient Details  Name: Sharon Cervantes MRN: [id number] Date of Birth: Mar 17, 1951 Referring Provider:  Vida Rigger, MD  Below are your personal goals for exercise, nutrition, and risk factors. Our goal is to help you stay on track towards obtaining and maintaining these goals. We will be discussing your progress on these goals with you throughout the program.  Initial Exercise Prescription: Initial Exercise Prescription - 10/11/19 1200      Date of Initial Exercise RX and Referring Provider   Date  10/11/19    Referring Provider  Georga Hacking MD      Treadmill   MPH  1.5    Grade  0.5    Minutes  15    METs  2.25      NuStep   Level  1    SPM  80    Minutes  15    METs  2      T5 Nustep   Level  1    SPM  80    Minutes  15    METs  2      Biostep-RELP   Level  1    SPM  50    Minutes  15    METs  2      Prescription Details   Frequency (times per week)  3    Duration  Progress to 30 minutes of continuous aerobic without signs/symptoms of physical distress      Intensity   THRR 40-80% of Max Heartrate  114-139    Ratings of Perceived Exertion  11-13    Perceived Dyspnea  0-4      Progression   Progression  Continue to progress workloads to maintain intensity without signs/symptoms of physical distress.      Resistance Training   Training Prescription  Yes    Weight  3 lb    Reps  10-15       Exercise Goals: Frequency: Be able to perform aerobic exercise two to three times per week in program working toward 2-5 days per week of home exercise.  Intensity: Work with a perceived exertion of 11 (fairly light) - 15 (hard) while following your exercise prescription.  We will make changes to your prescription with you as you progress through the program.   Duration: Be able to do 30 to 45 minutes of continuous aerobic exercise in addition to a 5 minute warm-up and a 5 minute cool-down routine.   Nutrition Goals: Your personal nutrition  goals will be established when you do your nutrition analysis with the dietician.  The following are general nutrition guidelines to follow: Cholesterol < 200mg /day Sodium < 1500mg /day Fiber: Women over 50 yrs - 21 grams per day  Personal Goals: Personal Goals and Risk Factors at Admission - 10/11/19 1242      Core Components/Risk Factors/Patient Goals on Admission    Weight Management  Yes;Obesity;Weight Loss    Intervention  Weight Management: Develop a combined nutrition and exercise program designed to reach desired caloric intake, while maintaining appropriate intake of nutrient and fiber, sodium and fats, and appropriate energy expenditure required for the weight goal.;Weight Management/Obesity: Establish reasonable short term and long term weight goals.;Weight Management: Provide education and appropriate resources to help participant work on and attain dietary goals.;Obesity: Provide education and appropriate resources to help participant work on and attain dietary goals.    Admit Weight  273 lb 14.4 oz (124.2 kg)    Goal Weight: Short Term  268  lb (121.6 kg)    Goal Weight: Long Term  260 lb (117.9 kg)    Expected Outcomes  Short Term: Continue to assess and modify interventions until short term weight is achieved;Long Term: Adherence to nutrition and physical activity/exercise program aimed toward attainment of established weight goal;Weight Loss: Understanding of general recommendations for a balanced deficit meal plan, which promotes 1-2 lb weight loss per week and includes a negative energy balance of [phone] kcal/d;Understanding recommendations for meals to include 15-35% energy as protein, 25-35% energy from fat, 35-60% energy from carbohydrates, less than 200mg  of dietary cholesterol, 20-35 gm of total fiber daily;Understanding of distribution of calorie intake throughout the day with the consumption of 4-5 meals/snacks    Improve shortness of breath with ADL's  Yes    Intervention   Provide education, individualized exercise plan and daily activity instruction to help decrease symptoms of SOB with activities of daily living.    Expected Outcomes  Short Term: Improve cardiorespiratory fitness to achieve a reduction of symptoms when performing ADLs;Long Term: Be able to perform more ADLs without symptoms or delay the onset of symptoms    Hypertension  Yes    Intervention  Provide education on lifestyle modifcations including regular physical activity/exercise, weight management, moderate sodium restriction and increased consumption of fresh fruit, vegetables, and low fat dairy, alcohol moderation, and smoking cessation.;Monitor prescription use compliance.    Expected Outcomes  Long Term: Maintenance of blood pressure at goal levels.;Short Term: Continued assessment and intervention until BP is < 140/67mm HG in hypertensive participants. < 130/92mm HG in hypertensive participants with diabetes, heart failure or chronic kidney disease.    Lipids  Yes    Intervention  Provide education and support for participant on nutrition & aerobic/resistive exercise along with prescribed medications to achieve LDL 70mg , HDL >40mg .    Expected Outcomes  Short Term: Participant states understanding of desired cholesterol values and is compliant with medications prescribed. Participant is following exercise prescription and nutrition guidelines.;Long Term: Cholesterol controlled with medications as prescribed, with individualized exercise RX and with personalized nutrition plan. Value goals: LDL < 70mg , HDL > 40 mg.       Tobacco Use Initial Evaluation: Social History   Tobacco Use  Smoking Status Never Smoker  Smokeless Tobacco Never Used    Exercise Goals and Review: Exercise Goals    Row Name 10/11/19 1241             Exercise Goals   Increase Physical Activity  Yes       Intervention  Provide advice, education, support and counseling about physical activity/exercise  needs.;Develop an individualized exercise prescription for aerobic and resistive training based on initial evaluation findings, risk stratification, comorbidities and participant's personal goals.       Expected Outcomes  Short Term: Attend rehab on a regular basis to increase amount of physical activity.;Long Term: Add in home exercise to make exercise part of routine and to increase amount of physical activity.;Long Term: Exercising regularly at least 3-5 days a week.       Increase Strength and Stamina  Yes       Intervention  Provide advice, education, support and counseling about physical activity/exercise needs.;Develop an individualized exercise prescription for aerobic and resistive training based on initial evaluation findings, risk stratification, comorbidities and participant's personal goals.       Expected Outcomes  Short Term: Increase workloads from initial exercise prescription for resistance, speed, and METs.;Short Term: Perform resistance training exercises routinely during rehab  and add in resistance training at home;Long Term: Improve cardiorespiratory fitness, muscular endurance and strength as measured by increased METs and functional capacity (6MWT)       Able to understand and use rate of perceived exertion (RPE) scale  Yes       Intervention  Provide education and explanation on how to use RPE scale       Expected Outcomes  Short Term: Able to use RPE daily in rehab to express subjective intensity level;Long Term:  Able to use RPE to guide intensity level when exercising independently       Able to understand and use Dyspnea scale  Yes       Intervention  Provide education and explanation on how to use Dyspnea scale       Expected Outcomes  Short Term: Able to use Dyspnea scale daily in rehab to express subjective sense of shortness of breath during exertion;Long Term: Able to use Dyspnea scale to guide intensity level when exercising independently       Knowledge and  understanding of Target Heart Rate Range (THRR)  Yes       Intervention  Provide education and explanation of THRR including how the numbers were predicted and where they are located for reference       Expected Outcomes  Short Term: Able to state/look up THRR;Long Term: Able to use THRR to govern intensity when exercising independently;Short Term: Able to use daily as guideline for intensity in rehab       Able to check pulse independently  Yes       Intervention  Provide education and demonstration on how to check pulse in carotid and radial arteries.;Review the importance of being able to check your own pulse for safety during independent exercise       Expected Outcomes  Short Term: Able to explain why pulse checking is important during independent exercise;Long Term: Able to check pulse independently and accurately       Understanding of Exercise Prescription  Yes       Intervention  Provide education, explanation, and written materials on patient's individual exercise prescription       Expected Outcomes  Short Term: Able to explain program exercise prescription;Long Term: Able to explain home exercise prescription to exercise independently          Copy of goals given to participant.

## 2019-10-11 NOTE — Progress Notes (Signed)
Pulmonary Individual Treatment Plan  Patient Details  Name: Sharon Cervantes MRN: [id number] Date of Birth: 1950/09/27 Referring Provider:     Pulmonary Rehab from 10/11/2019 in [facility] Cardiac and Pulmonary Rehab  Referring Provider  Claudette Stapler MD      Initial Encounter Date:    Pulmonary Rehab from 10/11/2019 in [facility] Cardiac and Pulmonary Rehab  Date  10/11/19      Visit Diagnosis: Severe persistent asthma, unspecified whether complicated  Pulmonary HTN ([place])  Patient's Home Medications on Admission:  Current Outpatient Medications:  .  Dupilumab (DUPIXENT) 300 MG/2ML SOPN, INJECT 2 PENS UNDER THE SKIN ON DAY 1., Disp: , Rfl:  .  albuterol (PROVENTIL HFA;VENTOLIN HFA) 108 (90 Base) MCG/ACT inhaler, Inhale into the lungs., Disp: , Rfl:  .  budesonide (PULMICORT) 0.5 MG/2ML nebulizer solution, Inhale into the lungs., Disp: , Rfl:  .  budesonide (PULMICORT) 0.5 MG/2ML nebulizer solution, Inhale into the lungs., Disp: , Rfl:  .  cyclobenzaprine (FLEXERIL) 10 MG tablet, Take 1 tablet (10 mg total) by mouth 2 (two) times daily as needed for muscle spasms., Disp: 20 tablet, Rfl: 0 .  fluticasone (FLONASE) 50 MCG/ACT nasal spray, Place into the nose., Disp: , Rfl:  .  fluticasone (FLOVENT HFA) 220 MCG/ACT inhaler, Inhale into the lungs., Disp: , Rfl:  .  Fluticasone-Salmeterol (ADVAIR) 100-50 MCG/DOSE AEPB, Inhale 1 puff into the lungs 2 (two) times daily. , Disp: , Rfl:  .  hydrochlorothiazide (HYDRODIURIL) 12.5 MG tablet, Take by mouth., Disp: , Rfl:  .  ibuprofen (ADVIL) 800 MG tablet, Take 1 tablet (800 mg total) by mouth every 8 (eight) hours as needed., Disp: 21 tablet, Rfl: 0 .  levocetirizine (XYZAL) 5 MG tablet, Take 5 mg by mouth every evening., Disp: , Rfl:  .  LORazepam (ATIVAN) 0.5 MG tablet, Take 0.5 mg by mouth 2 (two) times daily as needed. , Disp: , Rfl:  .  meloxicam (MOBIC) 15 MG tablet, Take 15 mg by mouth daily. , Disp: , Rfl:  .  Misc Natural Products (GLUCOSAMINE CHOND  COMPLEX/MSM PO), Take by mouth 2 (two) times daily., Disp: , Rfl:  .  montelukast (SINGULAIR) 10 MG tablet, Take 1 tablet (10 mg total) by mouth at bedtime., Disp: 30 tablet, Rfl: 0 .  Multiple Vitamins-Minerals (ICAPS AREDS 2 PO), Take 1 capsule by mouth 2 (two) times daily., Disp: , Rfl:  .  olmesartan-hydrochlorothiazide (BENICAR HCT) 40-12.5 MG tablet, Take 1 tablet by mouth daily., Disp: , Rfl:  .  pramipexole (MIRAPEX) 1.5 MG tablet, Take 1.5 mg by mouth 3 (three) times daily., Disp: , Rfl:  .  TURMERIC CURCUMIN PO, Take by mouth daily., Disp: , Rfl:  .  venlafaxine XR (EFFEXOR-XR) 150 MG 24 hr capsule, Take 1 capsule (150 mg total) by mouth daily with breakfast., Disp: 90 capsule, Rfl: 3 .  VITAMIN A PO, Take 1 tablet by mouth daily., Disp: , Rfl:  .  VITAMIN D, CHOLECALCIFEROL, PO, Take by mouth., Disp: , Rfl:  No current facility-administered medications for this visit.  Facility-Administered Medications Ordered in Other Visits:  .  [COMPLETED] sodium chloride 0.9 % nebulizer solution 3 mL, 3 mL, Nebulization, Once, 3 mL at 10/03/18 1130 **FOLLOWED BY** [COMPLETED] methacholine (PROVOCHOLINE) inhaler solution 0.125 mg, 2 mL, Inhalation, Once, 0.125 mg at 10/03/18 1139 **FOLLOWED BY** methacholine (PROVOCHOLINE) inhaler solution 0.5 mg, 2 mL, Inhalation, Once **FOLLOWED BY** methacholine (PROVOCHOLINE) inhaler solution 2 mg, 2 mL, Inhalation, Once **FOLLOWED BY** methacholine (PROVOCHOLINE) inhaler solution 8 mg,  2 mL, Inhalation, Once **FOLLOWED BY** methacholine (PROVOCHOLINE) inhaler solution 32 mg, 2 mL, Inhalation, Once **FOLLOWED BY** [COMPLETED] albuterol (PROVENTIL) (2.5 MG/3ML) 0.083% nebulizer solution 2.5 mg, 2.5 mg, Nebulization, Once, Ottie Glazier, MD, 2.5 mg at 10/03/18 1146  Past Medical History: Past Medical History:  Diagnosis Date  . Arthritis   . Asthma   . Chronic cough   . Colon polyps   . Depression   . GERD (gastroesophageal reflux disease)   . Hiatal hernia    . History of chicken pox   . Macular degeneration   . Multinodular goiter    FNA in past neg h/o thyroid cysts  . Pernicious anemia   . RLS (restless legs syndrome)     Tobacco Use: Social History   Tobacco Use  Smoking Status Never Smoker  Smokeless Tobacco Never Used    Labs: Recent Review Scientist, physiological    Labs for ITP Cardiac and Pulmonary Rehab Latest Ref Rng & Units 10/12/2018   Hemoglobin A1c 4.6 - 6.5 % 5.6       Pulmonary Assessment Scores: Pulmonary Assessment Scores    Row Name 10/11/19 1243         ADL UCSD   ADL Phase  Entry     SOB Score total  60     Rest  1     Walk  4     Stairs  4     Bath  1     Dress  1     Shop  4       CAT Score   CAT Score  25       mMRC Score   mMRC Score  4        UCSD: Self-administered rating of dyspnea associated with activities of daily living (ADLs) 6-point scale (0 = "not at all" to 5 = "maximal or unable to do because of breathlessness")  Scoring Scores range from 0 to 120.  Minimally important difference is 5 units  CAT: CAT can identify the health impairment of COPD patients and is better correlated with disease progression.  CAT has a scoring range of zero to 40. The CAT score is classified into four groups of low (less than 10), medium (10 - 20), high (21-30) and very high (31-40) based on the impact level of disease on health status. A CAT score over 10 suggests significant symptoms.  A worsening CAT score could be explained by an exacerbation, poor medication adherence, poor inhaler technique, or progression of COPD or comorbid conditions.  CAT MCID is 2 points  mMRC: mMRC (Modified Medical Research Council) Dyspnea Scale is used to assess the degree of baseline functional disability in patients of respiratory disease due to dyspnea. No minimal important difference is established. A decrease in score of 1 point or greater is considered a positive change.   Pulmonary Function Assessment:   Exercise  Target Goals: Exercise Program Goal: Individual exercise prescription set using results from initial 6 min walk test and THRR while considering  patient's activity barriers and safety.   Exercise Prescription Goal: Initial exercise prescription builds to 30-45 minutes a day of aerobic activity, 2-3 days per week.  Home exercise guidelines will be given to patient during program as part of exercise prescription that the participant will acknowledge.  Activity Barriers & Risk Stratification: Activity Barriers & Cardiac Risk Stratification - 10/11/19 1238      Activity Barriers & Cardiac Risk Stratification   Activity Barriers  Deconditioning;Joint Problems;Other (  comment);Muscular Weakness;Arthritis;Shortness of Breath    Comments  R knee bone on bone needs surgery once able       6 Minute Walk: 6 Minute Walk    Row Name 10/11/19 1237         6 Minute Walk   Phase  Initial     Distance  910 feet     Walk Time  6 minutes     # of Rest Breaks  0     MPH  1.72     METS  1.73     RPE  12     Perceived Dyspnea   2     VO2 Peak  6.06     Symptoms  Yes (comment)     Comments  SOB     Resting HR  89 bpm     Resting BP  124/74     Resting Oxygen Saturation   97 %     Exercise Oxygen Saturation  during 6 min walk  93 %     Max Ex. HR  122 bpm     Max Ex. BP  148/76     2 Minute Post BP  130/72       Interval HR   1 Minute HR  107     2 Minute HR  113     3 Minute HR  119     4 Minute HR  121     5 Minute HR  122     6 Minute HR  117     2 Minute Post HR  97     Interval Heart Rate?  Yes       Interval Oxygen   Interval Oxygen?  Yes     Baseline Oxygen Saturation %  97 %     1 Minute Oxygen Saturation %  98 %     1 Minute Liters of Oxygen  0 L Room Air     2 Minute Oxygen Saturation %  94 %     2 Minute Liters of Oxygen  0 L     3 Minute Oxygen Saturation %  93 %     3 Minute Liters of Oxygen  0 L     4 Minute Oxygen Saturation %  94 %     4 Minute Liters of Oxygen  0  L     5 Minute Oxygen Saturation %  93 %     5 Minute Liters of Oxygen  0 L     6 Minute Oxygen Saturation %  94 %     6 Minute Liters of Oxygen  0 L     2 Minute Post Oxygen Saturation %  99 %     2 Minute Post Liters of Oxygen  0 L       Oxygen Initial Assessment: Oxygen Initial Assessment - 10/10/19 1418      Home Oxygen   Home Oxygen Device  None    Sleep Oxygen Prescription  None   was supposed to use CPAP but sent back as it was not working with asthma   Home Exercise Oxygen Prescription  None    Home at Rest Exercise Oxygen Prescription  None      Initial 6 min Walk   Oxygen Used  None      Program Oxygen Prescription   Program Oxygen Prescription  None      Intervention   Short Term Goals  To learn and  understand importance of monitoring SPO2 with pulse oximeter and demonstrate accurate use of the pulse oximeter.;To learn and understand importance of maintaining oxygen saturations>88%;To learn and demonstrate proper pursed lip breathing techniques or other breathing techniques.;To learn and demonstrate proper use of respiratory medications    Long  Term Goals  Verbalizes importance of monitoring SPO2 with pulse oximeter and return demonstration;Maintenance of O2 saturations>88%;Exhibits proper breathing techniques, such as pursed lip breathing or other method taught during program session;Compliance with respiratory medication;Demonstrates proper use of MDI's       Oxygen Re-Evaluation:   Oxygen Discharge (Final Oxygen Re-Evaluation):   Initial Exercise Prescription: Initial Exercise Prescription - 10/11/19 1200      Date of Initial Exercise RX and Referring Provider   Date  10/11/19    Referring Provider  Claudette Stapler MD      Treadmill   MPH  1.5    Grade  0.5    Minutes  15    METs  2.25      NuStep   Level  1    SPM  80    Minutes  15    METs  2      T5 Nustep   Level  1    SPM  80    Minutes  15    METs  2      Biostep-RELP   Level  1     SPM  50    Minutes  15    METs  2      Prescription Details   Frequency (times per week)  3    Duration  Progress to 30 minutes of continuous aerobic without signs/symptoms of physical distress      Intensity   THRR 40-80% of Max Heartrate  114-139    Ratings of Perceived Exertion  11-13    Perceived Dyspnea  0-4      Progression   Progression  Continue to progress workloads to maintain intensity without signs/symptoms of physical distress.      Resistance Training   Training Prescription  Yes    Weight  3 lb    Reps  10-15       Perform Capillary Blood Glucose checks as needed.  Exercise Prescription Changes: Exercise Prescription Changes    Row Name 10/11/19 1200             Response to Exercise   Blood Pressure (Admit)  124/74       Blood Pressure (Exercise)  148/76       Blood Pressure (Exit)  130/72       Heart Rate (Admit)  89 bpm       Heart Rate (Exercise)  122 bpm       Heart Rate (Exit)  91 bpm       Oxygen Saturation (Admit)  97 %       Oxygen Saturation (Exercise)  93 %       Oxygen Saturation (Exit)  98 %       Rating of Perceived Exertion (Exercise)  12       Perceived Dyspnea (Exercise)  2       Symptoms  SOb       Comments  walk test results          Exercise Comments:   Exercise Goals and Review: Exercise Goals    Row Name 10/11/19 1241             Exercise Goals   Increase  Physical Activity  Yes       Intervention  Provide advice, education, support and counseling about physical activity/exercise needs.;Develop an individualized exercise prescription for aerobic and resistive training based on initial evaluation findings, risk stratification, comorbidities and participant's personal goals.       Expected Outcomes  Short Term: Attend rehab on a regular basis to increase amount of physical activity.;Long Term: Add in home exercise to make exercise part of routine and to increase amount of physical activity.;Long Term: Exercising  regularly at least 3-5 days a week.       Increase Strength and Stamina  Yes       Intervention  Provide advice, education, support and counseling about physical activity/exercise needs.;Develop an individualized exercise prescription for aerobic and resistive training based on initial evaluation findings, risk stratification, comorbidities and participant's personal goals.       Expected Outcomes  Short Term: Increase workloads from initial exercise prescription for resistance, speed, and METs.;Short Term: Perform resistance training exercises routinely during rehab and add in resistance training at home;Long Term: Improve cardiorespiratory fitness, muscular endurance and strength as measured by increased METs and functional capacity (6MWT)       Able to understand and use rate of perceived exertion (RPE) scale  Yes       Intervention  Provide education and explanation on how to use RPE scale       Expected Outcomes  Short Term: Able to use RPE daily in rehab to express subjective intensity level;Long Term:  Able to use RPE to guide intensity level when exercising independently       Able to understand and use Dyspnea scale  Yes       Intervention  Provide education and explanation on how to use Dyspnea scale       Expected Outcomes  Short Term: Able to use Dyspnea scale daily in rehab to express subjective sense of shortness of breath during exertion;Long Term: Able to use Dyspnea scale to guide intensity level when exercising independently       Knowledge and understanding of Target Heart Rate Range (THRR)  Yes       Intervention  Provide education and explanation of THRR including how the numbers were predicted and where they are located for reference       Expected Outcomes  Short Term: Able to state/look up THRR;Long Term: Able to use THRR to govern intensity when exercising independently;Short Term: Able to use daily as guideline for intensity in rehab       Able to check pulse independently  Yes        Intervention  Provide education and demonstration on how to check pulse in carotid and radial arteries.;Review the importance of being able to check your own pulse for safety during independent exercise       Expected Outcomes  Short Term: Able to explain why pulse checking is important during independent exercise;Long Term: Able to check pulse independently and accurately       Understanding of Exercise Prescription  Yes       Intervention  Provide education, explanation, and written materials on patient's individual exercise prescription       Expected Outcomes  Short Term: Able to explain program exercise prescription;Long Term: Able to explain home exercise prescription to exercise independently          Exercise Goals Re-Evaluation :   Discharge Exercise Prescription (Final Exercise Prescription Changes): Exercise Prescription Changes - 10/11/19 1200  Response to Exercise   Blood Pressure (Admit)  124/74    Blood Pressure (Exercise)  148/76    Blood Pressure (Exit)  130/72    Heart Rate (Admit)  89 bpm    Heart Rate (Exercise)  122 bpm    Heart Rate (Exit)  91 bpm    Oxygen Saturation (Admit)  97 %    Oxygen Saturation (Exercise)  93 %    Oxygen Saturation (Exit)  98 %    Rating of Perceived Exertion (Exercise)  12    Perceived Dyspnea (Exercise)  2    Symptoms  SOb    Comments  walk test results       Nutrition:  Target Goals: Understanding of nutrition guidelines, daily intake of sodium <1527m, cholesterol <2068m calories 30% from fat and 7% or less from saturated fats, daily to have 5 or more servings of fruits and vegetables.  Biometrics: Pre Biometrics - 10/11/19 1241      Pre Biometrics   Height  5' 6.75" (1.695 m)    Weight  273 lb 14.4 oz (124.2 kg)    BMI (Calculated)  43.24    Single Leg Stand  6.76 seconds        Nutrition Therapy Plan and Nutrition Goals:   Nutrition Assessments: Nutrition Assessments - 10/11/19 1242      MEDFICTS  Scores   Pre Score  29       Nutrition Goals Re-Evaluation:   Nutrition Goals Discharge (Final Nutrition Goals Re-Evaluation):   Psychosocial: Target Goals: Acknowledge presence or absence of significant depression and/or stress, maximize coping skills, provide positive support system. Participant is able to verbalize types and ability to use techniques and skills needed for reducing stress and depression.   Initial Review & Psychosocial Screening: Initial Psych Review & Screening - 10/10/19 1420      Initial Review   Current issues with  Current Stress Concerns;History of Depression;Current Depression    Source of Stress Concerns  Chronic Illness;Financial    Comments  long history of pulm disease, finances are stressful and in a day to day mangament phase, not currently having symptoms      Family Dynamics   Good Support System?  No   friends ([facility] [place] to call when needed   Concerns  Inappropriate over/under dependence on family/friends      Barriers   Psychosocial barriers to participate in program  The patient should benefit from training in stress management and relaxation.;Psychosocial barriers identified (see note)      Screening Interventions   Interventions  Provide feedback about the scores to participant;To provide support and resources with identified psychosocial needs;Encouraged to exercise    Expected Outcomes  Short Term goal: Utilizing psychosocial counselor, staff and physician to assist with identification of specific Stressors or current issues interfering with healing process. Setting desired goal for each stressor or current issue identified.;Long Term Goal: Stressors or current issues are controlled or eliminated.;Short Term goal: Identification and review with participant of any Quality of Life or Depression concerns found by scoring the questionnaire.;Long Term goal: The participant improves quality of Life and PHQ9 Scores as seen by post  scores and/or verbalization of changes       Quality of Life Scores:  Scores of 19 and below usually indicate a poorer quality of life in these areas.  A difference of  2-3 points is a clinically meaningful difference.  A difference of 2-3 points in the total score of the Quality  of Life Index has been associated with significant improvement in overall quality of life, self-image, physical symptoms, and general health in studies assessing change in quality of life.  PHQ-9: Recent Review Flowsheet Data    Depression screen [facility] 2/9 10/11/2019 05/21/2019   Decreased Interest 1 0   Down, Depressed, Hopeless 1 0   PHQ - 2 Score 2 0   Altered sleeping 3 0   Tired, decreased energy 3 0   Change in appetite 3 0   Feeling bad or failure about yourself  1 0   Trouble concentrating 1 0   Moving slowly or fidgety/restless 0 0   Suicidal thoughts 0 0   PHQ-9 Score 13 0   Difficult doing work/chores Somewhat difficult Not difficult at all     Interpretation of Total Score  Total Score Depression Severity:  1-4 = Minimal depression, 5-9 = Mild depression, 10-14 = Moderate depression, 15-19 = Moderately severe depression, 20-27 = Severe depression   Psychosocial Evaluation and Intervention: Psychosocial Evaluation - 10/10/19 1428      Psychosocial Evaluation & Interventions   Interventions  Stress management education;Encouraged to exercise with the program and follow exercise prescription    Comments  Cairo is coming into pulmonary rehab with hx of asthma and pulmonary hypertension.  She lives by herself but has friends in [place] that she can call on for support.  She has a history of depression with some good days and bad days, mostly good recently.  She is feeling pretty good overall.  She is looking forward to rehab and wants to get back to feeling better. She wants to be able to breathe better and lose weight so that she can go walking again!!  She does have some financial strain being on her  own and we talked about letting us know if it becomes a burden and she needs help.    Expected Outcomes  Short: Attend rehab to build stamina and work on weight loss  Long: Stay positive and get to walking again.       Psychosocial Re-Evaluation:   Psychosocial Discharge (Final Psychosocial Re-Evaluation):   Education: Education Goals: Education classes will be provided on a weekly basis, covering required topics. Participant will state understanding/return demonstration of topics presented.  Learning Barriers/Preferences:   Education Topics:  Initial Evaluation Education: - Verbal, written and demonstration of respiratory meds, oximetry and breathing techniques. Instruction on use of nebulizers and MDIs and importance of monitoring MDI activations.   Pulmonary Rehab from 10/11/2019 in [facility] Cardiac and Pulmonary Rehab  Date  10/11/19  Educator  [facility]  Instruction Review Code  1- Verbalizes Understanding      General Nutrition Guidelines/Fats and Fiber: -Group instruction provided by verbal, written material, models and posters to present the general guidelines for heart healthy nutrition. Gives an explanation and review of dietary fats and fiber.   Controlling Sodium/Reading Food Labels: -Group verbal and written material supporting the discussion of sodium use in heart healthy nutrition. Review and explanation with models, verbal and written materials for utilization of the food label.   Exercise Physiology & General Exercise Guidelines: - Group verbal and written instruction with models to review the exercise physiology of the cardiovascular system and associated critical values. Provides general exercise guidelines with specific guidelines to those with heart or lung disease.    Aerobic Exercise & Resistance Training: - Gives group verbal and written instruction on the various components of exercise. Focuses on aerobic and resistive training programs and  the benefits of this  training and how to safely progress through these programs.   Flexibility, Balance, Mind/Body Relaxation: Provides group verbal/written instruction on the benefits of flexibility and balance training, including mind/body exercise modes such as yoga, pilates and tai chi.  Demonstration and skill practice provided.   Stress and Anxiety: - Provides group verbal and written instruction about the health risks of elevated stress and causes of high stress.  Discuss the correlation between heart/lung disease and anxiety and treatment options. Review healthy ways to manage with stress and anxiety.   Depression: - Provides group verbal and written instruction on the correlation between heart/lung disease and depressed mood, treatment options, and the stigmas associated with seeking treatment.   Exercise & Equipment Safety: - Individual verbal instruction and demonstration of equipment use and safety with use of the equipment.   Pulmonary Rehab from 10/11/2019 in [facility] Cardiac and Pulmonary Rehab  Date  10/11/19  Educator  [facility]  Instruction Review Code  1- Verbalizes Understanding      Infection Prevention: - Provides verbal and written material to individual with discussion of infection control including proper hand washing and proper equipment cleaning during exercise session.   Pulmonary Rehab from 10/11/2019 in [facility] Cardiac and Pulmonary Rehab  Date  10/11/19  Educator  [facility]  Instruction Review Code  1- Verbalizes Understanding      Falls Prevention: - Provides verbal and written material to individual with discussion of falls prevention and safety.   Pulmonary Rehab from 10/11/2019 in [facility] Cardiac and Pulmonary Rehab  Date  10/11/19  Educator  [facility]  Instruction Review Code  1- Verbalizes Understanding      Diabetes: - Individual verbal and written instruction to review signs/symptoms of diabetes, desired ranges of glucose level fasting, after meals and with exercise. Advice that pre and  post exercise glucose checks will be done for 3 sessions at entry of program.   Chronic Lung Diseases: - Group verbal and written instruction to review updates, respiratory medications, advancements in procedures and treatments. Discuss use of supplemental oxygen including available portable oxygen systems, continuous and intermittent flow rates, concentrators, personal use and safety guidelines. Review proper use of inhaler and spacers. Provide informative websites for self-education.    Energy Conservation: - Provide group verbal and written instruction for methods to conserve energy, plan and organize activities. Instruct on pacing techniques, use of adaptive equipment and posture/positioning to relieve shortness of breath.   Triggers and Exacerbations: - Group verbal and written instruction to review types of environmental triggers and ways to prevent exacerbations. Discuss weather changes, air quality and the benefits of nasal washing. Review warning signs and symptoms to help prevent infections. Discuss techniques for effective airway clearance, coughing, and vibrations.   AED/CPR: - Group verbal and written instruction with the use of models to demonstrate the basic use of the AED with the basic ABC's of resuscitation.   Anatomy and Physiology of the Lungs: - Group verbal and written instruction with the use of models to provide basic lung anatomy and physiology related to function, structure and complications of lung disease.   Anatomy & Physiology of the Heart: - Group verbal and written instruction and models provide basic cardiac anatomy and physiology, with the coronary electrical and arterial systems. Review of Valvular disease and Heart Failure   Cardiac Medications: - Group verbal and written instruction to review commonly prescribed medications for heart disease. Reviews the medication, class of the drug, and side effects.   Know Your  Numbers and Risk Factors: -Group  verbal and written instruction about important numbers in your health.  Discussion of what are risk factors and how they play a role in the disease process.  Review of Cholesterol, Blood Pressure, Diabetes, and BMI and the role they play in your overall health.   Sleep Hygiene: -Provides group verbal and written instruction about how sleep can affect your health.  Define sleep hygiene, discuss sleep cycles and impact of sleep habits. Review good sleep hygiene tips.    Other: -Provides group and verbal instruction on various topics (see comments)    Knowledge Questionnaire Score: Knowledge Questionnaire Score - 10/11/19 1242      Knowledge Questionnaire Score   Pre Score  17/18 Education: O2 saturations        Core Components/Risk Factors/Patient Goals at Admission: Personal Goals and Risk Factors at Admission - 10/11/19 1242      Core Components/Risk Factors/Patient Goals on Admission    Weight Management  Yes;Obesity;Weight Loss    Intervention  Weight Management: Develop a combined nutrition and exercise program designed to reach desired caloric intake, while maintaining appropriate intake of nutrient and fiber, sodium and fats, and appropriate energy expenditure required for the weight goal.;Weight Management/Obesity: Establish reasonable short term and long term weight goals.;Weight Management: Provide education and appropriate resources to help participant work on and attain dietary goals.;Obesity: Provide education and appropriate resources to help participant work on and attain dietary goals.    Admit Weight  273 lb 14.4 oz (124.2 kg)    Goal Weight: Short Term  268 lb (121.6 kg)    Goal Weight: Long Term  260 lb (117.9 kg)    Expected Outcomes  Short Term: Continue to assess and modify interventions until short term weight is achieved;Long Term: Adherence to nutrition and physical activity/exercise program aimed toward attainment of established weight goal;Weight Loss:  Understanding of general recommendations for a balanced deficit meal plan, which promotes 1-2 lb weight loss per week and includes a negative energy balance of [phone] kcal/d;Understanding recommendations for meals to include 15-35% energy as protein, 25-35% energy from fat, 35-60% energy from carbohydrates, less than 227m of dietary cholesterol, 20-35 gm of total fiber daily;Understanding of distribution of calorie intake throughout the day with the consumption of 4-5 meals/snacks    Improve shortness of breath with ADL's  Yes    Intervention  Provide education, individualized exercise plan and daily activity instruction to help decrease symptoms of SOB with activities of daily living.    Expected Outcomes  Short Term: Improve cardiorespiratory fitness to achieve a reduction of symptoms when performing ADLs;Long Term: Be able to perform more ADLs without symptoms or delay the onset of symptoms    Hypertension  Yes    Intervention  Provide education on lifestyle modifcations including regular physical activity/exercise, weight management, moderate sodium restriction and increased consumption of fresh fruit, vegetables, and low fat dairy, alcohol moderation, and smoking cessation.;Monitor prescription use compliance.    Expected Outcomes  Long Term: Maintenance of blood pressure at goal levels.;Short Term: Continued assessment and intervention until BP is < 140/977mHG in hypertensive participants. < 130/8034mG in hypertensive participants with diabetes, heart failure or chronic kidney disease.    Lipids  Yes    Intervention  Provide education and support for participant on nutrition & aerobic/resistive exercise along with prescribed medications to achieve LDL <35m59mDL >40mg6m Expected Outcomes  Short Term: Participant states understanding of desired cholesterol values and is compliant  with medications prescribed. Participant is following exercise prescription and nutrition guidelines.;Long Term:  Cholesterol controlled with medications as prescribed, with individualized exercise RX and with personalized nutrition plan. Value goals: LDL < 27m, HDL > 40 mg.       Core Components/Risk Factors/Patient Goals Review:    Core Components/Risk Factors/Patient Goals at Discharge (Final Review):    ITP Comments: ITP Comments    Row Name 10/10/19 1439 10/11/19 1229         ITP Comments  Completed virtual orientation today.  EP eval scheduled for 2/11 at 11am.  Documentation for diagnosis can be found in CE encounter 10/03/19.  Completed 6MWT and gym orientation.  Initial ITP created and sent for review to Dr. MEmily Filbert Medical Director.         Comments: Initial ITP

## 2019-10-15 ENCOUNTER — Encounter: Payer: Medicare Other | Admitting: *Deleted

## 2019-10-15 ENCOUNTER — Other Ambulatory Visit: Payer: Self-pay

## 2019-10-15 DIAGNOSIS — J45909 Unspecified asthma, uncomplicated: Secondary | ICD-10-CM | POA: Diagnosis not present

## 2019-10-15 DIAGNOSIS — I272 Pulmonary hypertension, unspecified: Secondary | ICD-10-CM

## 2019-10-15 DIAGNOSIS — J4550 Severe persistent asthma, uncomplicated: Secondary | ICD-10-CM

## 2019-10-15 NOTE — Progress Notes (Signed)
Daily Session Note  Patient Details  Name: Sharon Cervantes MRN: [id number] Date of Birth: 1951-01-08 Referring Provider:     Pulmonary Rehab from 10/11/2019 in [facility] Cardiac and Pulmonary Rehab  Referring Provider  Claudette Stapler MD      Encounter Date: 10/15/2019  Check In: Session Check In - 10/15/19 0758      Check-In   Supervising physician immediately available to respond to emergencies  See telemetry face sheet for immediately available ER MD    Location  [facility]-Cardiac & Pulmonary Rehab    Staff Present  Heath Lark, RN, BSN, CCRP;Jessica [place], MA, RCEP, CCRP, [place], BS, ACSM CEP, Exercise Physiologist;Joseph [place] RCP,RRT,BSRT    Virtual Visit  No    Medication changes reported      No    Fall or balance concerns reported     No    Warm-up and Cool-down  Performed on first and last piece of equipment    Resistance Training Performed  Yes    VAD Patient?  No    PAD/SET Patient?  No      Pain Assessment   Currently in Pain?  No/denies          Social History   Tobacco Use  Smoking Status Never Smoker  Smokeless Tobacco Never Used    Goals Met:  Exercise tolerated well Personal goals reviewed No report of cardiac concerns or symptoms  Goals Unmet:  Not Applicable  Comments: First full day of exercise!  Patient was oriented to gym and equipment including functions, settings, policies, and procedures.  Patient's individual exercise prescription and treatment plan were reviewed.  All starting workloads were established based on the results of the 6 minute walk test done at initial orientation visit.  The plan for exercise progression was also introduced and progression will be customized based on patient's performance and goals.    Dr. Emily Filbert is Medical Director for [place] and [name].

## 2019-10-16 ENCOUNTER — Other Ambulatory Visit: Payer: Self-pay

## 2019-10-16 ENCOUNTER — Encounter: Payer: Medicare Other | Admitting: *Deleted

## 2019-10-16 DIAGNOSIS — J45909 Unspecified asthma, uncomplicated: Secondary | ICD-10-CM | POA: Diagnosis not present

## 2019-10-16 DIAGNOSIS — I272 Pulmonary hypertension, unspecified: Secondary | ICD-10-CM

## 2019-10-16 DIAGNOSIS — J4550 Severe persistent asthma, uncomplicated: Secondary | ICD-10-CM

## 2019-10-16 NOTE — Progress Notes (Signed)
Daily Session Note  Patient Details  Name: Sharon Cervantes MRN: [id number] Date of Birth: 12-08-1950 Referring Provider:     Pulmonary Rehab from 10/11/2019 in [facility] Cardiac and Pulmonary Rehab  Referring Provider  Claudette Stapler MD      Encounter Date: 10/16/2019  Check In: Session Check In - 10/16/19 0838      Check-In   Supervising physician immediately available to respond to emergencies  See telemetry face sheet for immediately available ER MD    Location  [facility]-Cardiac & Pulmonary Rehab    Staff Present  Heath Lark, RN, BSN, CCRP;Melissa [place] RDN, LDN;Jessica [place], MA, RCEP, CCRP, CCET;Amanda Sommer, BA, ACSM CEP, Exercise Physiologist    Virtual Visit  No    Medication changes reported      No    Fall or balance concerns reported     No    Warm-up and Cool-down  Performed on first and last piece of equipment    Resistance Training Performed  Yes    VAD Patient?  No    PAD/SET Patient?  No      Pain Assessment   Currently in Pain?  No/denies          Social History   Tobacco Use  Smoking Status Never Smoker  Smokeless Tobacco Never Used    Goals Met:  Proper associated with RPD/PD & O2 Sat Independence with exercise equipment Exercise tolerated well No report of cardiac concerns or symptoms  Goals Unmet:  Not Applicable  Comments: Pt able to follow exercise prescription today without complaint.  Will continue to monitor for progression.    Dr. Emily Filbert is Medical Director for [place] and [name].

## 2019-10-21 ENCOUNTER — Other Ambulatory Visit: Payer: Self-pay | Admitting: Internal Medicine

## 2019-10-21 DIAGNOSIS — J309 Allergic rhinitis, unspecified: Secondary | ICD-10-CM

## 2019-10-21 MED ORDER — MONTELUKAST SODIUM 10 MG PO TABS: 10.0000 mg | ORAL_TABLET | Freq: Every day | ORAL | 1 refills | Status: AC

## 2019-10-22 ENCOUNTER — Other Ambulatory Visit: Payer: Self-pay

## 2019-10-22 ENCOUNTER — Encounter: Payer: Medicare Other | Admitting: *Deleted

## 2019-10-22 DIAGNOSIS — I272 Pulmonary hypertension, unspecified: Secondary | ICD-10-CM

## 2019-10-22 DIAGNOSIS — J4550 Severe persistent asthma, uncomplicated: Secondary | ICD-10-CM

## 2019-10-22 DIAGNOSIS — J45909 Unspecified asthma, uncomplicated: Secondary | ICD-10-CM | POA: Diagnosis not present

## 2019-10-22 NOTE — Progress Notes (Signed)
Daily Session Note  Patient Details  Name: Anetria Harwick MRN: [id number] Date of Birth: 01-28-1951 Referring Provider:     Pulmonary Rehab from 10/11/2019 in [facility] Cardiac and Pulmonary Rehab  Referring Provider  Claudette Stapler MD      Encounter Date: 10/22/2019  Check In: Session Check In - 10/22/19 0748      Check-In   Supervising physician immediately available to respond to emergencies  See telemetry face sheet for immediately available ER MD    Location  [facility]-Cardiac & Pulmonary Rehab    Staff Present  Heath Lark, RN, BSN, [place], BS, ACSM CEP, Exercise Physiologist;Joseph Tessie Fass RCP,RRT,BSRT    Virtual Visit  No    Medication changes reported      No    Fall or balance concerns reported     No    Warm-up and Cool-down  Performed on first and last piece of equipment    Resistance Training Performed  Yes    VAD Patient?  No    PAD/SET Patient?  No      Pain Assessment   Currently in Pain?  No/denies          Social History   Tobacco Use  Smoking Status Never Smoker  Smokeless Tobacco Never Used    Goals Met:  Independence with exercise equipment Exercise tolerated well No report of cardiac concerns or symptoms  Goals Unmet:  Not Applicable  Comments: Pt able to follow exercise prescription today without complaint.  Will continue to monitor for progression.    Dr. Emily Filbert is Medical Director for [place] and [name].

## 2019-10-23 ENCOUNTER — Encounter: Payer: Medicare Other | Admitting: *Deleted

## 2019-10-23 ENCOUNTER — Other Ambulatory Visit: Payer: Self-pay

## 2019-10-23 DIAGNOSIS — J45909 Unspecified asthma, uncomplicated: Secondary | ICD-10-CM | POA: Diagnosis not present

## 2019-10-23 DIAGNOSIS — J4550 Severe persistent asthma, uncomplicated: Secondary | ICD-10-CM

## 2019-10-23 DIAGNOSIS — I272 Pulmonary hypertension, unspecified: Secondary | ICD-10-CM

## 2019-10-23 NOTE — Progress Notes (Signed)
Daily Session Note  Patient Details  Name: Aliese Brannum MRN: [id number] Date of Birth: 05/06/1951 Referring Provider:     Pulmonary Rehab from 10/11/2019 in [facility] Cardiac and Pulmonary Rehab  Referring Provider  Claudette Stapler MD      Encounter Date: 10/23/2019  Check In: Session Check In - 10/23/19 0802      Check-In   Supervising physician immediately available to respond to emergencies  See telemetry face sheet for immediately available ER MD    Location  [facility]-Cardiac & Pulmonary Rehab    Staff Present  Heath Lark, RN, BSN, CCRP;Sharyon Peitz [place], MA, RCEP, CCRP, [place], [place], ACSM CEP, Exercise Physiologist    Virtual Visit  No    Medication changes reported      No    Fall or balance concerns reported     No    Warm-up and Cool-down  Performed on first and last piece of equipment    Resistance Training Performed  Yes    VAD Patient?  No    PAD/SET Patient?  No      Pain Assessment   Currently in Pain?  No/denies          Social History   Tobacco Use  Smoking Status Never Smoker  Smokeless Tobacco Never Used    Goals Met:  Proper associated with RPD/PD & O2 Sat Independence with exercise equipment Using PLB without cueing & demonstrates good technique Exercise tolerated well Personal goals reviewed No report of cardiac concerns or symptoms Strength training completed today  Goals Unmet:  Not Applicable  Comments: Pt able to follow exercise prescription today without complaint.  Will continue to monitor for progression.  Reviewed home exercise with pt today.  Pt plans to walk, go to [facility], and use videos for exercise.  She is planning to get a pulse oximeter from [employer]. Reviewed THR, pulse, RPE, sign and symptoms, and when to call 911 or MD.  Also discussed weather considerations and indoor options.  Pt voiced understanding.    Dr. Emily Filbert is Medical Director for [place] and [name].

## 2019-10-24 ENCOUNTER — Encounter: Payer: Self-pay | Admitting: *Deleted

## 2019-10-24 DIAGNOSIS — J4550 Severe persistent asthma, uncomplicated: Secondary | ICD-10-CM

## 2019-10-24 DIAGNOSIS — I272 Pulmonary hypertension, unspecified: Secondary | ICD-10-CM

## 2019-10-24 NOTE — Progress Notes (Signed)
Pulmonary Individual Treatment Plan  Patient Details  Name: Sharon Cervantes MRN: [id number] Date of Birth: [birth year] Referring Provider:     Pulmonary Rehab from 10/11/2019 in [facility] Cardiac and Pulmonary Rehab  Referring Provider  Claudette Stapler MD      Initial Encounter Date:    Pulmonary Rehab from 10/11/2019 in [facility] Cardiac and Pulmonary Rehab  Date  10/11/19      Visit Diagnosis: Severe persistent asthma, unspecified whether complicated  Pulmonary HTN ([place])  Patient's Home Medications on Admission:  Current Outpatient Medications:  .  albuterol (PROVENTIL HFA;VENTOLIN HFA) 108 (90 Base) MCG/ACT inhaler, Inhale into the lungs., Disp: , Rfl:  .  budesonide (PULMICORT) 0.5 MG/2ML nebulizer solution, Inhale into the lungs., Disp: , Rfl:  .  budesonide (PULMICORT) 0.5 MG/2ML nebulizer solution, Inhale into the lungs., Disp: , Rfl:  .  cyclobenzaprine (FLEXERIL) 10 MG tablet, Take 1 tablet (10 mg total) by mouth 2 (two) times daily as needed for muscle spasms., Disp: 20 tablet, Rfl: 0 .  Dupilumab (DUPIXENT) 300 MG/2ML SOPN, INJECT 2 PENS UNDER THE SKIN ON DAY 1., Disp: , Rfl:  .  fluticasone (FLONASE) 50 MCG/ACT nasal spray, Place into the nose., Disp: , Rfl:  .  fluticasone (FLOVENT HFA) 220 MCG/ACT inhaler, Inhale into the lungs., Disp: , Rfl:  .  Fluticasone-Salmeterol (ADVAIR) 100-50 MCG/DOSE AEPB, Inhale 1 puff into the lungs 2 (two) times daily. , Disp: , Rfl:  .  hydrochlorothiazide (HYDRODIURIL) 12.5 MG tablet, Take by mouth., Disp: , Rfl:  .  ibuprofen (ADVIL) 800 MG tablet, Take 1 tablet (800 mg total) by mouth every 8 (eight) hours as needed., Disp: 21 tablet, Rfl: 0 .  levocetirizine (XYZAL) 5 MG tablet, Take 5 mg by mouth every evening., Disp: , Rfl:  .  LORazepam (ATIVAN) 0.5 MG tablet, Take 0.5 mg by mouth 2 (two) times daily as needed. , Disp: , Rfl:  .  meloxicam (MOBIC) 15 MG tablet, Take 15 mg by mouth daily. , Disp: , Rfl:  .  Misc Natural Products (GLUCOSAMINE CHOND  COMPLEX/MSM PO), Take by mouth 2 (two) times daily., Disp: , Rfl:  .  montelukast (SINGULAIR) 10 MG tablet, Take 1 tablet (10 mg total) by mouth at bedtime. appt further refills, Disp: 90 tablet, Rfl: 1 .  Multiple Vitamins-Minerals (ICAPS AREDS 2 PO), Take 1 capsule by mouth 2 (two) times daily., Disp: , Rfl:  .  olmesartan-hydrochlorothiazide (BENICAR HCT) 40-12.5 MG tablet, Take 1 tablet by mouth daily., Disp: , Rfl:  .  pramipexole (MIRAPEX) 1.5 MG tablet, Take 1.5 mg by mouth 3 (three) times daily., Disp: , Rfl:  .  TURMERIC CURCUMIN PO, Take by mouth daily., Disp: , Rfl:  .  venlafaxine XR (EFFEXOR-XR) 150 MG 24 hr capsule, Take 1 capsule (150 mg total) by mouth daily with breakfast., Disp: 90 capsule, Rfl: 3 .  VITAMIN A PO, Take 1 tablet by mouth daily., Disp: , Rfl:  .  VITAMIN D, CHOLECALCIFEROL, PO, Take by mouth., Disp: , Rfl:  No current facility-administered medications for this visit.  Facility-Administered Medications Ordered in Other Visits:  .  [COMPLETED] sodium chloride 0.9 % nebulizer solution 3 mL, 3 mL, Nebulization, Once, 3 mL at 10/03/18 1130 **FOLLOWED BY** [COMPLETED] methacholine (PROVOCHOLINE) inhaler solution 0.125 mg, 2 mL, Inhalation, Once, 0.125 mg at 10/03/18 1139 **FOLLOWED BY** methacholine (PROVOCHOLINE) inhaler solution 0.5 mg, 2 mL, Inhalation, Once **FOLLOWED BY** methacholine (PROVOCHOLINE) inhaler solution 2 mg, 2 mL, Inhalation, Once **FOLLOWED BY** methacholine (PROVOCHOLINE) inhaler  solution 8 mg, 2 mL, Inhalation, Once **FOLLOWED BY** methacholine (PROVOCHOLINE) inhaler solution 32 mg, 2 mL, Inhalation, Once **FOLLOWED BY** [COMPLETED] albuterol (PROVENTIL) (2.5 MG/3ML) 0.083% nebulizer solution 2.5 mg, 2.5 mg, Nebulization, Once, Ottie Glazier, MD, 2.5 mg at 10/03/18 1146  Past Medical History: Past Medical History:  Diagnosis Date  . Arthritis   . Asthma   . Chronic cough   . Colon polyps   . Depression   . GERD (gastroesophageal reflux disease)    . Hiatal hernia   . History of chicken pox   . Macular degeneration   . Multinodular goiter    FNA in past neg h/o thyroid cysts  . Pernicious anemia   . RLS (restless legs syndrome)     Tobacco Use: Social History   Tobacco Use  Smoking Status Never Smoker  Smokeless Tobacco Never Used    Labs: Recent Review Scientist, physiological    Labs for ITP Cardiac and Pulmonary Rehab Latest Ref Rng & Units 10/12/2018   Hemoglobin A1c 4.6 - 6.5 % 5.6       Pulmonary Assessment Scores: Pulmonary Assessment Scores    Row Name 10/11/19 1243         ADL UCSD   ADL Phase  Entry     SOB Score total  60     Rest  1     Walk  4     Stairs  4     Bath  1     Dress  1     Shop  4       CAT Score   CAT Score  25       mMRC Score   mMRC Score  4        UCSD: Self-administered rating of dyspnea associated with activities of daily living (ADLs) 6-point scale (0 = "not at all" to 5 = "maximal or unable to do because of breathlessness")  Scoring Scores range from 0 to 120.  Minimally important difference is 5 units  CAT: CAT can identify the health impairment of COPD patients and is better correlated with disease progression.  CAT has a scoring range of zero to 40. The CAT score is classified into four groups of low (less than 10), medium (10 - 20), high (21-30) and very high (31-40) based on the impact level of disease on health status. A CAT score over 10 suggests significant symptoms.  A worsening CAT score could be explained by an exacerbation, poor medication adherence, poor inhaler technique, or progression of COPD or comorbid conditions.  CAT MCID is 2 points  mMRC: mMRC (Modified Medical Research Council) Dyspnea Scale is used to assess the degree of baseline functional disability in patients of respiratory disease due to dyspnea. No minimal important difference is established. A decrease in score of 1 point or greater is considered a positive change.   Pulmonary Function  Assessment:   Exercise Target Goals: Exercise Program Goal: Individual exercise prescription set using results from initial 6 min walk test and THRR while considering  patient's activity barriers and safety.   Exercise Prescription Goal: Initial exercise prescription builds to 30-45 minutes a day of aerobic activity, 2-3 days per week.  Home exercise guidelines will be given to patient during program as part of exercise prescription that the participant will acknowledge.  Activity Barriers & Risk Stratification: Activity Barriers & Cardiac Risk Stratification - 10/11/19 1238      Activity Barriers & Cardiac Risk Stratification   Activity Barriers  Deconditioning;Joint Problems;Other (comment);Muscular Weakness;Arthritis;Shortness of Breath    Comments  R knee bone on bone needs surgery once able       6 Minute Walk: 6 Minute Walk    Row Name 10/11/19 1237         6 Minute Walk   Phase  Initial     Distance  910 feet     Walk Time  6 minutes     # of Rest Breaks  0     MPH  1.72     METS  1.73     RPE  12     Perceived Dyspnea   2     VO2 Peak  6.06     Symptoms  Yes (comment)     Comments  SOB     Resting HR  89 bpm     Resting BP  124/74     Resting Oxygen Saturation   97 %     Exercise Oxygen Saturation  during 6 min walk  93 %     Max Ex. HR  122 bpm     Max Ex. BP  148/76     2 Minute Post BP  130/72       Interval HR   1 Minute HR  107     2 Minute HR  113     3 Minute HR  119     4 Minute HR  121     5 Minute HR  122     6 Minute HR  117     2 Minute Post HR  97     Interval Heart Rate?  Yes       Interval Oxygen   Interval Oxygen?  Yes     Baseline Oxygen Saturation %  97 %     1 Minute Oxygen Saturation %  98 %     1 Minute Liters of Oxygen  0 L Room Air     2 Minute Oxygen Saturation %  94 %     2 Minute Liters of Oxygen  0 L     3 Minute Oxygen Saturation %  93 %     3 Minute Liters of Oxygen  0 L     4 Minute Oxygen Saturation %  94 %     4  Minute Liters of Oxygen  0 L     5 Minute Oxygen Saturation %  93 %     5 Minute Liters of Oxygen  0 L     6 Minute Oxygen Saturation %  94 %     6 Minute Liters of Oxygen  0 L     2 Minute Post Oxygen Saturation %  99 %     2 Minute Post Liters of Oxygen  0 L       Oxygen Initial Assessment: Oxygen Initial Assessment - 10/10/19 1418      Home Oxygen   Home Oxygen Device  None    Sleep Oxygen Prescription  None   was supposed to use CPAP but sent back as it was not working with asthma   Home Exercise Oxygen Prescription  None    Home at Rest Exercise Oxygen Prescription  None      Initial 6 min Walk   Oxygen Used  None      Program Oxygen Prescription   Program Oxygen Prescription  None      Intervention   Short Term Goals  To  learn and understand importance of monitoring SPO2 with pulse oximeter and demonstrate accurate use of the pulse oximeter.;To learn and understand importance of maintaining oxygen saturations>88%;To learn and demonstrate proper pursed lip breathing techniques or other breathing techniques.;To learn and demonstrate proper use of respiratory medications    Long  Term Goals  Verbalizes importance of monitoring SPO2 with pulse oximeter and return demonstration;Maintenance of O2 saturations>88%;Exhibits proper breathing techniques, such as pursed lip breathing or other method taught during program session;Compliance with respiratory medication;Demonstrates proper use of MDI's       Oxygen Re-Evaluation: Oxygen Re-Evaluation    Row Name 10/15/19 0817             Program Oxygen Prescription   Program Oxygen Prescription  None         Home Oxygen   Home Oxygen Device  None       Sleep Oxygen Prescription  None       Home Exercise Oxygen Prescription  None       Home at Rest Exercise Oxygen Prescription  None       Compliance with Home Oxygen Use  -- NA         Goals/Expected Outcomes   Short Term Goals  To learn and understand importance of monitoring  SPO2 with pulse oximeter and demonstrate accurate use of the pulse oximeter.;To learn and understand importance of maintaining oxygen saturations>88%;To learn and demonstrate proper pursed lip breathing techniques or other breathing techniques.       Long  Term Goals  Exhibits compliance with exercise, home and travel O2 prescription;Verbalizes importance of monitoring SPO2 with pulse oximeter and return demonstration;Maintenance of O2 saturations>88%;Exhibits proper breathing techniques, such as pursed lip breathing or other method taught during program session       Comments  Reviewed PLB technique with pt.  Talked about how it works and it's importance in maintaining their exercise saturations.       Goals/Expected Outcomes  Short: Become more profiecient at using PLB.   Long: Become independent at using PLB.          Oxygen Discharge (Final Oxygen Re-Evaluation): Oxygen Re-Evaluation - 10/15/19 0817      Program Oxygen Prescription   Program Oxygen Prescription  None      Home Oxygen   Home Oxygen Device  None    Sleep Oxygen Prescription  None    Home Exercise Oxygen Prescription  None    Home at Rest Exercise Oxygen Prescription  None    Compliance with Home Oxygen Use  --   NA     Goals/Expected Outcomes   Short Term Goals  To learn and understand importance of monitoring SPO2 with pulse oximeter and demonstrate accurate use of the pulse oximeter.;To learn and understand importance of maintaining oxygen saturations>88%;To learn and demonstrate proper pursed lip breathing techniques or other breathing techniques.    Long  Term Goals  Exhibits compliance with exercise, home and travel O2 prescription;Verbalizes importance of monitoring SPO2 with pulse oximeter and return demonstration;Maintenance of O2 saturations>88%;Exhibits proper breathing techniques, such as pursed lip breathing or other method taught during program session    Comments  Reviewed PLB technique with pt.  Talked  about how it works and it's importance in maintaining their exercise saturations.    Goals/Expected Outcomes  Short: Become more profiecient at using PLB.   Long: Become independent at using PLB.       Initial Exercise Prescription: Initial Exercise Prescription - 10/11/19 1200  Date of Initial Exercise RX and Referring Provider   Date  10/11/19    Referring Provider  Claudette Stapler MD      Treadmill   MPH  1.5    Grade  0.5    Minutes  15    METs  2.25      NuStep   Level  1    SPM  80    Minutes  15    METs  2      T5 Nustep   Level  1    SPM  80    Minutes  15    METs  2      Biostep-RELP   Level  1    SPM  50    Minutes  15    METs  2      Prescription Details   Frequency (times per week)  3    Duration  Progress to 30 minutes of continuous aerobic without signs/symptoms of physical distress      Intensity   THRR 40-80% of Max Heartrate  114-139    Ratings of Perceived Exertion  11-13    Perceived Dyspnea  0-4      Progression   Progression  Continue to progress workloads to maintain intensity without signs/symptoms of physical distress.      Resistance Training   Training Prescription  Yes    Weight  3 lb    Reps  10-15       Perform Capillary Blood Glucose checks as needed.  Exercise Prescription Changes: Exercise Prescription Changes    Row Name 10/11/19 1200 10/17/19 0900 10/23/19 0800         Response to Exercise   Blood Pressure (Admit)  124/74  138/88  --     Blood Pressure (Exercise)  148/76  152/68  --     Blood Pressure (Exit)  130/72  130/78  --     Heart Rate (Admit)  89 bpm  81 bpm  --     Heart Rate (Exercise)  122 bpm  114 bpm  --     Heart Rate (Exit)  91 bpm  90 bpm  --     Oxygen Saturation (Admit)  97 %  96 %  --     Oxygen Saturation (Exercise)  93 %  97 %  --     Oxygen Saturation (Exit)  98 %  98 %  --     Rating of Perceived Exertion (Exercise)  12  13  --     Perceived Dyspnea (Exercise)  2  3  --     Symptoms   SOb  --  --     Comments  walk test results  second day   --     Duration  --  Continue with 30 min of aerobic exercise without signs/symptoms of physical distress.  --     Intensity  --  THRR unchanged  --       Progression   Progression  --  Continue to progress workloads to maintain intensity without signs/symptoms of physical distress.  --     Average METs  --  2  --       Resistance Training   Training Prescription  --  Yes  --     Weight  --  3 lb  --     Reps  --  10-15  --       Interval Training   Interval Training  --  No  --       Treadmill   MPH  --  1.5  --     Grade  --  0.5  --     Minutes  --  15  --     METs  --  2.25  --       T5 Nustep   Level  --  3  --     SPM  --  80  --     Minutes  --  15  --     METs  --  2  --       Home Exercise Plan   Plans to continue exercise at  --  --  [employer] (comment) walking, gym, [employer]     Frequency  --  --  Add 2 additional days to program exercise sessions.     Initial Home Exercises Provided  --  --  10/23/19        Exercise Comments: Exercise Comments    Row Name 10/15/19 0759           Exercise Comments  First full day of exercise!  Patient was oriented to gym and equipment including functions, settings, policies, and procedures.  Patient's individual exercise prescription and treatment plan were reviewed.  All starting workloads were established based on the results of the 6 minute walk test done at initial orientation visit.  The plan for exercise progression was also introduced and progression will be customized based on patient's performance and goals.          Exercise Goals and Review: Exercise Goals    Row Name 10/11/19 1241             Exercise Goals   Increase Physical Activity  Yes       Intervention  Provide advice, education, support and counseling about physical activity/exercise needs.;Develop an individualized exercise prescription for aerobic and resistive training based on  initial evaluation findings, risk stratification, comorbidities and participant's personal goals.       Expected Outcomes  Short Term: Attend rehab on a regular basis to increase amount of physical activity.;Long Term: Add in home exercise to make exercise part of routine and to increase amount of physical activity.;Long Term: Exercising regularly at least 3-5 days a week.       Increase Strength and Stamina  Yes       Intervention  Provide advice, education, support and counseling about physical activity/exercise needs.;Develop an individualized exercise prescription for aerobic and resistive training based on initial evaluation findings, risk stratification, comorbidities and participant's personal goals.       Expected Outcomes  Short Term: Increase workloads from initial exercise prescription for resistance, speed, and METs.;Short Term: Perform resistance training exercises routinely during rehab and add in resistance training at home;Long Term: Improve cardiorespiratory fitness, muscular endurance and strength as measured by increased METs and functional capacity (6MWT)       Able to understand and use rate of perceived exertion (RPE) scale  Yes       Intervention  Provide education and explanation on how to use RPE scale       Expected Outcomes  Short Term: Able to use RPE daily in rehab to express subjective intensity level;Long Term:  Able to use RPE to guide intensity level when exercising independently       Able to understand and use Dyspnea scale  Yes       Intervention  Provide education and explanation  on how to use Dyspnea scale       Expected Outcomes  Short Term: Able to use Dyspnea scale daily in rehab to express subjective sense of shortness of breath during exertion;Long Term: Able to use Dyspnea scale to guide intensity level when exercising independently       Knowledge and understanding of Target Heart Rate Range (THRR)  Yes       Intervention  Provide education and explanation of  THRR including how the numbers were predicted and where they are located for reference       Expected Outcomes  Short Term: Able to state/look up THRR;Long Term: Able to use THRR to govern intensity when exercising independently;Short Term: Able to use daily as guideline for intensity in rehab       Able to check pulse independently  Yes       Intervention  Provide education and demonstration on how to check pulse in carotid and radial arteries.;Review the importance of being able to check your own pulse for safety during independent exercise       Expected Outcomes  Short Term: Able to explain why pulse checking is important during independent exercise;Long Term: Able to check pulse independently and accurately       Understanding of Exercise Prescription  Yes       Intervention  Provide education, explanation, and written materials on patient's individual exercise prescription       Expected Outcomes  Short Term: Able to explain program exercise prescription;Long Term: Able to explain home exercise prescription to exercise independently          Exercise Goals Re-Evaluation : Exercise Goals Re-Evaluation    Row Name 10/15/19 0815 10/23/19 0804           Exercise Goal Re-Evaluation   Exercise Goals Review  Able to understand and use Dyspnea scale;Able to understand and use rate of perceived exertion (RPE) scale;Knowledge and understanding of Target Heart Rate Range (THRR);Understanding of Exercise Prescription  Able to understand and use Dyspnea scale;Able to understand and use rate of perceived exertion (RPE) scale;Knowledge and understanding of Target Heart Rate Range (THRR);Understanding of Exercise Prescription;Increase Physical Activity;Increase Strength and Stamina;Able to check pulse independently      Comments  Reviewed RPE scale, THR and program prescription with pt today.  Pt voiced understanding and was given a copy of goals to take home.  Reviewed home exercise with pt today.  Pt  plans to walk, go to [facility], and use videos for exercise.  She is planning to get a pulse oximeter from [employer]. Reviewed THR, pulse, RPE, sign and symptoms, and when to call 911 or MD.  Also discussed weather considerations and indoor options.  Pt voiced understanding.      Expected Outcomes  Short: Use RPE daily to regulate intensity. Long: Follow program prescription in THR.  Short: Start to add in exercise at home.  Long: Continue to improve stamina.         Discharge Exercise Prescription (Final Exercise Prescription Changes): Exercise Prescription Changes - 10/23/19 0800      Home Exercise Plan   Plans to continue exercise at  [facility] (comment)   walking, gym, [employer]   Frequency  Add 2 additional days to program exercise sessions.    Initial Home Exercises Provided  10/23/19       Nutrition:  Target Goals: Understanding of nutrition guidelines, daily intake of sodium '1500mg'$ , cholesterol '200mg'$ , calories 30% from fat and 7% or less from  saturated fats, daily to have 5 or more servings of fruits and vegetables.  Biometrics: Pre Biometrics - 10/11/19 1241      Pre Biometrics   Height  5' 6.75" (1.695 m)    Weight  273 lb 14.4 oz (124.2 kg)    BMI (Calculated)  43.24    Single Leg Stand  6.76 seconds        Nutrition Therapy Plan and Nutrition Goals:   Nutrition Assessments: Nutrition Assessments - 10/11/19 1242      MEDFICTS Scores   Pre Score  29       Nutrition Goals Re-Evaluation:   Nutrition Goals Discharge (Final Nutrition Goals Re-Evaluation):   Psychosocial: Target Goals: Acknowledge presence or absence of significant depression and/or stress, maximize coping skills, provide positive support system. Participant is able to verbalize types and ability to use techniques and skills needed for reducing stress and depression.   Initial Review & Psychosocial Screening: Initial Psych Review & Screening - 10/10/19 1420      Initial Review   Current  issues with  Current Stress Concerns;History of Depression;Current Depression    Source of Stress Concerns  Chronic Illness;Financial    Comments  long history of pulm disease, finances are stressful and in a day to day mangament phase, not currently having symptoms      Family Dynamics   Good Support System?  No   friends [facility] and [place]) able to call when needed   Concerns  Inappropriate over/under dependence on family/friends      Barriers   Psychosocial barriers to participate in program  The patient should benefit from training in stress management and relaxation.;Psychosocial barriers identified (see note)      Screening Interventions   Interventions  Provide feedback about the scores to participant;To provide support and resources with identified psychosocial needs;Encouraged to exercise    Expected Outcomes  Short Term goal: Utilizing psychosocial counselor, staff and physician to assist with identification of specific Stressors or current issues interfering with healing process. Setting desired goal for each stressor or current issue identified.;Long Term Goal: Stressors or current issues are controlled or eliminated.;Short Term goal: Identification and review with participant of any Quality of Life or Depression concerns found by scoring the questionnaire.;Long Term goal: The participant improves quality of Life and PHQ9 Scores as seen by post scores and/or verbalization of changes       Quality of Life Scores:  Scores of 19 and below usually indicate a poorer quality of life in these areas.  A difference of  2-3 points is a clinically meaningful difference.  A difference of 2-3 points in the total score of the Quality of Life Index has been associated with significant improvement in overall quality of life, self-image, physical symptoms, and general health in studies assessing change in quality of life.  PHQ-9: Recent Review Flowsheet Data    Depression screen [facility] 2/9  10/11/2019 05/21/2019   Decreased Interest 1 0   Down, Depressed, Hopeless 1 0   PHQ - 2 Score 2 0   Altered sleeping 3 0   Tired, decreased energy 3 0   Change in appetite 3 0   Feeling bad or failure about yourself  1 0   Trouble concentrating 1 0   Moving slowly or fidgety/restless 0 0   Suicidal thoughts 0 0   PHQ-9 Score 13 0   Difficult doing work/chores Somewhat difficult Not difficult at all     Interpretation of Total Score  Total  Score Depression Severity:  1-4 = Minimal depression, 5-9 = Mild depression, 10-14 = Moderate depression, 15-19 = Moderately severe depression, 20-27 = Severe depression   Psychosocial Evaluation and Intervention: Psychosocial Evaluation - 10/10/19 1428      Psychosocial Evaluation & Interventions   Interventions  Stress management education;Encouraged to exercise with the program and follow exercise prescription    Comments  Ranette is coming into pulmonary rehab with hx of asthma and pulmonary hypertension.  She lives by herself but has friends in [place] that she can call on for support.  She has a history of depression with some good days and bad days, mostly good recently.  She is feeling pretty good overall.  She is looking forward to rehab and wants to get back to feeling better. She wants to be able to breathe better and lose weight so that she can go walking again!!  She does have some financial strain being on her own and we talked about letting us know if it becomes a burden and she needs help.    Expected Outcomes  Short: Attend rehab to build stamina and work on weight loss  Long: Stay positive and get to walking again.       Psychosocial Re-Evaluation:   Psychosocial Discharge (Final Psychosocial Re-Evaluation):   Education: Education Goals: Education classes will be provided on a weekly basis, covering required topics. Participant will state understanding/return demonstration of topics presented.  Learning  Barriers/Preferences:   Education Topics:  Initial Evaluation Education: - Verbal, written and demonstration of respiratory meds, oximetry and breathing techniques. Instruction on use of nebulizers and MDIs and importance of monitoring MDI activations.   Pulmonary Rehab from 10/11/2019 in [facility] Cardiac and Pulmonary Rehab  Date  10/11/19  Educator  [facility]  Instruction Review Code  1- Verbalizes Understanding      General Nutrition Guidelines/Fats and Fiber: -Group instruction provided by verbal, written material, models and posters to present the general guidelines for heart healthy nutrition. Gives an explanation and review of dietary fats and fiber.   Controlling Sodium/Reading Food Labels: -Group verbal and written material supporting the discussion of sodium use in heart healthy nutrition. Review and explanation with models, verbal and written materials for utilization of the food label.   Exercise Physiology & General Exercise Guidelines: - Group verbal and written instruction with models to review the exercise physiology of the cardiovascular system and associated critical values. Provides general exercise guidelines with specific guidelines to those with heart or lung disease.    Aerobic Exercise & Resistance Training: - Gives group verbal and written instruction on the various components of exercise. Focuses on aerobic and resistive training programs and the benefits of this training and how to safely progress through these programs.   Flexibility, Balance, Mind/Body Relaxation: Provides group verbal/written instruction on the benefits of flexibility and balance training, including mind/body exercise modes such as yoga, pilates and tai chi.  Demonstration and skill practice provided.   Stress and Anxiety: - Provides group verbal and written instruction about the health risks of elevated stress and causes of high stress.  Discuss the correlation between heart/lung disease and  anxiety and treatment options. Review healthy ways to manage with stress and anxiety.   Depression: - Provides group verbal and written instruction on the correlation between heart/lung disease and depressed mood, treatment options, and the stigmas associated with seeking treatment.   Exercise & Equipment Safety: - Individual verbal instruction and demonstration of equipment use and safety with use of  the equipment.   Pulmonary Rehab from 10/11/2019 in [facility] Cardiac and Pulmonary Rehab  Date  10/11/19  Educator  [facility]  Instruction Review Code  1- Verbalizes Understanding      Infection Prevention: - Provides verbal and written material to individual with discussion of infection control including proper hand washing and proper equipment cleaning during exercise session.   Pulmonary Rehab from 10/11/2019 in [facility] Cardiac and Pulmonary Rehab  Date  10/11/19  Educator  [facility]  Instruction Review Code  1- Verbalizes Understanding      Falls Prevention: - Provides verbal and written material to individual with discussion of falls prevention and safety.   Pulmonary Rehab from 10/11/2019 in [facility] Cardiac and Pulmonary Rehab  Date  10/11/19  Educator  [facility]  Instruction Review Code  1- Verbalizes Understanding      Diabetes: - Individual verbal and written instruction to review signs/symptoms of diabetes, desired ranges of glucose level fasting, after meals and with exercise. Advice that pre and post exercise glucose checks will be done for 3 sessions at entry of program.   Chronic Lung Diseases: - Group verbal and written instruction to review updates, respiratory medications, advancements in procedures and treatments. Discuss use of supplemental oxygen including available portable oxygen systems, continuous and intermittent flow rates, concentrators, personal use and safety guidelines. Review proper use of inhaler and spacers. Provide informative websites for self-education.    Energy  Conservation: - Provide group verbal and written instruction for methods to conserve energy, plan and organize activities. Instruct on pacing techniques, use of adaptive equipment and posture/positioning to relieve shortness of breath.   Triggers and Exacerbations: - Group verbal and written instruction to review types of environmental triggers and ways to prevent exacerbations. Discuss weather changes, air quality and the benefits of nasal washing. Review warning signs and symptoms to help prevent infections. Discuss techniques for effective airway clearance, coughing, and vibrations.   AED/CPR: - Group verbal and written instruction with the use of models to demonstrate the basic use of the AED with the basic ABC's of resuscitation.   Anatomy and Physiology of the Lungs: - Group verbal and written instruction with the use of models to provide basic lung anatomy and physiology related to function, structure and complications of lung disease.   Anatomy & Physiology of the Heart: - Group verbal and written instruction and models provide basic cardiac anatomy and physiology, with the coronary electrical and arterial systems. Review of Valvular disease and Heart Failure   Cardiac Medications: - Group verbal and written instruction to review commonly prescribed medications for heart disease. Reviews the medication, class of the drug, and side effects.   Know Your Numbers and Risk Factors: -Group verbal and written instruction about important numbers in your health.  Discussion of what are risk factors and how they play a role in the disease process.  Review of Cholesterol, Blood Pressure, Diabetes, and BMI and the role they play in your overall health.   Sleep Hygiene: -Provides group verbal and written instruction about how sleep can affect your health.  Define sleep hygiene, discuss sleep cycles and impact of sleep habits. Review good sleep hygiene tips.    Other: -Provides group and  verbal instruction on various topics (see comments)    Knowledge Questionnaire Score: Knowledge Questionnaire Score - 10/11/19 1242      Knowledge Questionnaire Score   Pre Score  17/18 Education: O2 saturations        Core Components/Risk Factors/Patient Goals at Admission: Personal  Goals and Risk Factors at Admission - 10/11/19 1242      Core Components/Risk Factors/Patient Goals on Admission    Weight Management  Yes;Obesity;Weight Loss    Intervention  Weight Management: Develop a combined nutrition and exercise program designed to reach desired caloric intake, while maintaining appropriate intake of nutrient and fiber, sodium and fats, and appropriate energy expenditure required for the weight goal.;Weight Management/Obesity: Establish reasonable short term and long term weight goals.;Weight Management: Provide education and appropriate resources to help participant work on and attain dietary goals.;Obesity: Provide education and appropriate resources to help participant work on and attain dietary goals.    Admit Weight  273 lb 14.4 oz (124.2 kg)    Goal Weight: Short Term  268 lb (121.6 kg)    Goal Weight: Long Term  260 lb (117.9 kg)    Expected Outcomes  Short Term: Continue to assess and modify interventions until short term weight is achieved;Long Term: Adherence to nutrition and physical activity/exercise program aimed toward attainment of established weight goal;Weight Loss: Understanding of general recommendations for a balanced deficit meal plan, which promotes 1-2 lb weight loss per week and includes a negative energy balance of [phone] kcal/d;Understanding recommendations for meals to include 15-35% energy as protein, 25-35% energy from fat, 35-60% energy from carbohydrates, less than '200mg'$  of dietary cholesterol, 20-35 gm of total fiber daily;Understanding of distribution of calorie intake throughout the day with the consumption of 4-5 meals/snacks    Improve shortness of  breath with ADL's  Yes    Intervention  Provide education, individualized exercise plan and daily activity instruction to help decrease symptoms of SOB with activities of daily living.    Expected Outcomes  Short Term: Improve cardiorespiratory fitness to achieve a reduction of symptoms when performing ADLs;Long Term: Be able to perform more ADLs without symptoms or delay the onset of symptoms    Hypertension  Yes    Intervention  Provide education on lifestyle modifcations including regular physical activity/exercise, weight management, moderate sodium restriction and increased consumption of fresh fruit, vegetables, and low fat dairy, alcohol moderation, and smoking cessation.;Monitor prescription use compliance.    Expected Outcomes  Long Term: Maintenance of blood pressure at goal levels.;Short Term: Continued assessment and intervention until BP is < 140/16m HG in hypertensive participants. < 130/890mHG in hypertensive participants with diabetes, heart failure or chronic kidney disease.    Lipids  Yes    Intervention  Provide education and support for participant on nutrition & aerobic/resistive exercise along with prescribed medications to achieve LDL '70mg'$ , HDL >'40mg'$ .    Expected Outcomes  Short Term: Participant states understanding of desired cholesterol values and is compliant with medications prescribed. Participant is following exercise prescription and nutrition guidelines.;Long Term: Cholesterol controlled with medications as prescribed, with individualized exercise RX and with personalized nutrition plan. Value goals: LDL < '70mg'$ , HDL > 40 mg.       Core Components/Risk Factors/Patient Goals Review:    Core Components/Risk Factors/Patient Goals at Discharge (Final Review):    ITP Comments: ITP Comments    Row Name 10/10/19 1439 10/11/19 1229 10/15/19 0759 10/24/19 [id number]   ITP Comments  Completed virtual orientation today.  EP eval scheduled for 2/11 at 11am.  Documentation for  diagnosis can be found in CE encounter 10/03/19.  Completed 6MWT and gym orientation.  Initial ITP created and sent for review to Dr. MaEmily FilbertMedical Director.  First full day of exercise!  Patient was oriented to gym and  equipment including functions, settings, policies, and procedures.  Patient's individual exercise prescription and treatment plan were reviewed.  All starting workloads were established based on the results of the 6 minute walk test done at initial orientation visit.  The plan for exercise progression was also introduced and progression will be customized based on patient's performance and goals.  30 day chart review completed. ITP sent to Dr Zachery Dakins Medical Director, for review,changes as needed and signature.       Comments:

## 2019-10-29 ENCOUNTER — Encounter: Payer: Medicare Other | Attending: Pulmonary Disease | Admitting: *Deleted

## 2019-10-29 ENCOUNTER — Other Ambulatory Visit: Payer: Self-pay

## 2019-10-29 DIAGNOSIS — J45909 Unspecified asthma, uncomplicated: Secondary | ICD-10-CM | POA: Diagnosis not present

## 2019-10-29 DIAGNOSIS — I272 Pulmonary hypertension, unspecified: Secondary | ICD-10-CM

## 2019-10-29 DIAGNOSIS — J4550 Severe persistent asthma, uncomplicated: Secondary | ICD-10-CM

## 2019-10-29 NOTE — Progress Notes (Signed)
Daily Session Note  Patient Details  Name: Marsha Gundlach MRN: [id number] Date of Birth: [birth year] Referring Provider:     Pulmonary Rehab from 10/11/2019 in [facility] Cardiac and Pulmonary Rehab  Referring Provider  Claudette Stapler MD      Encounter Date: 10/29/2019  Check In: Session Check In - 10/29/19 0739      Check-In   Supervising physician immediately available to respond to emergencies  See telemetry face sheet for immediately available ER MD    Location  [facility]-Cardiac & Pulmonary Rehab    Staff Present  Heath Lark, RN, BSN, CCRP;Joseph Foy [place], [place], ACSM CEP, Exercise Physiologist    Virtual Visit  No    Medication changes reported      No    Fall or balance concerns reported     No    Warm-up and Cool-down  Performed on first and last piece of equipment    Resistance Training Performed  Yes    VAD Patient?  No    PAD/SET Patient?  No      Pain Assessment   Currently in Pain?  No/denies          Social History   Tobacco Use  Smoking Status Never Smoker  Smokeless Tobacco Never Used    Goals Met:  Proper associated with RPD/PD & O2 Sat Independence with exercise equipment Exercise tolerated well No report of cardiac concerns or symptoms  Goals Unmet:  Not Applicable  Comments: Pt able to follow exercise prescription today without complaint.  Will continue to monitor for progression.    Dr. Emily Filbert is Medical Director for [place] and [name].

## 2019-11-02 ENCOUNTER — Other Ambulatory Visit: Payer: Self-pay

## 2019-11-02 ENCOUNTER — Encounter: Payer: Medicare Other | Admitting: *Deleted

## 2019-11-02 DIAGNOSIS — I272 Pulmonary hypertension, unspecified: Secondary | ICD-10-CM

## 2019-11-02 DIAGNOSIS — J45909 Unspecified asthma, uncomplicated: Secondary | ICD-10-CM | POA: Diagnosis not present

## 2019-11-02 DIAGNOSIS — J4550 Severe persistent asthma, uncomplicated: Secondary | ICD-10-CM

## 2019-11-02 NOTE — Progress Notes (Signed)
Daily Session Note  Patient Details  Name: Sharon Cervantes MRN: [id number] Date of Birth: October 15, 1950 Referring Provider:     Pulmonary Rehab from 10/11/2019 in [facility] Cardiac and Pulmonary Rehab  Referring Provider  Claudette Stapler MD      Encounter Date: 11/02/2019  Check In: Session Check In - 11/02/19 0742      Check-In   Supervising physician immediately available to respond to emergencies  See telemetry face sheet for immediately available ER MD    Location  [facility]-Cardiac & Pulmonary Rehab    Staff Present  Heath Lark, RN, BSN, CCRP;Joseph Hood RCP,RRT,BSRT;Jessica [place], [place], [place], [place], CCET    Virtual Visit  No    Medication changes reported      No    Fall or balance concerns reported     No    Warm-up and Cool-down  Performed on first and last piece of equipment    Resistance Training Performed  Yes    VAD Patient?  No    PAD/SET Patient?  No      Pain Assessment   Currently in Pain?  No/denies          Social History   Tobacco Use  Smoking Status Never Smoker  Smokeless Tobacco Never Used    Goals Met:  Independence with exercise equipment Exercise tolerated well No report of cardiac concerns or symptoms  Goals Unmet:  Not Applicable  Comments: Pt able to follow exercise prescription today without complaint.  Will continue to monitor for progression.    Dr. Emily Filbert is Medical Director for [place] and [name].

## 2019-11-05 ENCOUNTER — Other Ambulatory Visit: Payer: Self-pay

## 2019-11-05 ENCOUNTER — Encounter: Payer: Medicare Other | Admitting: *Deleted

## 2019-11-05 DIAGNOSIS — I272 Pulmonary hypertension, unspecified: Secondary | ICD-10-CM

## 2019-11-05 DIAGNOSIS — J45909 Unspecified asthma, uncomplicated: Secondary | ICD-10-CM | POA: Diagnosis not present

## 2019-11-05 DIAGNOSIS — J4550 Severe persistent asthma, uncomplicated: Secondary | ICD-10-CM

## 2019-11-05 NOTE — Progress Notes (Signed)
Daily Session Note  Patient Details  Name: Sharon Cervantes MRN: [id number] Date of Birth: [birth year] Referring Provider:     Pulmonary Rehab from 10/11/2019 in [facility] Cardiac and Pulmonary Rehab  Referring Provider  Claudette Stapler MD      Encounter Date: 11/05/2019  Check In: Session Check In - 11/05/19 0810      Check-In   Supervising physician immediately available to respond to emergencies  See telemetry face sheet for immediately available ER MD    Location  [facility]-Cardiac & Pulmonary Rehab    Staff Present  Heath Lark, RN, BSN, CCRP;Jessica [place], MA, RCEP, CCRP, [place], BS, ACSM CEP, Exercise Physiologist;Joseph Gurabo RCP,RRT,BSRT    Virtual Visit  No    Medication changes reported      No    Fall or balance concerns reported     No    Warm-up and Cool-down  Performed on first and last piece of equipment    Resistance Training Performed  Yes    VAD Patient?  No    PAD/SET Patient?  No      Pain Assessment   Currently in Pain?  No/denies          Social History   Tobacco Use  Smoking Status Never Smoker  Smokeless Tobacco Never Used    Goals Met:  Proper associated with RPD/PD & O2 Sat Independence with exercise equipment Exercise tolerated well No report of cardiac concerns or symptoms  Goals Unmet:  Not Applicable  Comments: Pt able to follow exercise prescription today without complaint.  Will continue to monitor for progression.    Dr. Emily Filbert is Medical Director for [place] and [name].

## 2019-11-07 ENCOUNTER — Encounter: Payer: Medicare Other | Admitting: *Deleted

## 2019-11-07 ENCOUNTER — Other Ambulatory Visit: Payer: Self-pay

## 2019-11-07 DIAGNOSIS — I272 Pulmonary hypertension, unspecified: Secondary | ICD-10-CM

## 2019-11-07 DIAGNOSIS — J45909 Unspecified asthma, uncomplicated: Secondary | ICD-10-CM | POA: Diagnosis not present

## 2019-11-07 DIAGNOSIS — J4550 Severe persistent asthma, uncomplicated: Secondary | ICD-10-CM

## 2019-11-07 NOTE — Progress Notes (Signed)
Daily Session Note  Patient Details  Name: Sharon Cervantes MRN: [id number] Date of Birth: [birth year] Referring Provider:     Pulmonary Rehab from 10/11/2019 in [facility] Cardiac and Pulmonary Rehab  Referring Provider  Claudette Stapler MD      Encounter Date: 11/07/2019  Check In: Session Check In - 11/07/19 0818      Check-In   Supervising physician immediately available to respond to emergencies  See telemetry face sheet for immediately available ER MD    Location  [facility]-Cardiac & Pulmonary Rehab    Staff Present  Heath Lark, RN, BSN, CCRP;Jeanna Durrell BS, Exercise Physiologist;Joseph Hood RCP,RRT,BSRT    Virtual Visit  No    Medication changes reported      No    Fall or balance concerns reported     No    Warm-up and Cool-down  Performed on first and last piece of equipment    Resistance Training Performed  Yes    VAD Patient?  No    PAD/SET Patient?  No      Pain Assessment   Currently in Pain?  No/denies          Social History   Tobacco Use  Smoking Status Never Smoker  Smokeless Tobacco Never Used    Goals Met:  Proper associated with RPD/PD & O2 Sat Independence with exercise equipment Exercise tolerated well No report of cardiac concerns or symptoms  Goals Unmet:  Not Applicable  Comments: Pt able to follow exercise prescription today without complaint.  Will continue to monitor for progression.    Dr. Emily Filbert is Medical Director for [place] and [name].

## 2019-11-08 ENCOUNTER — Other Ambulatory Visit: Payer: Self-pay

## 2019-11-08 DIAGNOSIS — J4550 Severe persistent asthma, uncomplicated: Secondary | ICD-10-CM

## 2019-11-08 NOTE — Progress Notes (Signed)
Completed Initial RD Eval 

## 2019-11-09 ENCOUNTER — Other Ambulatory Visit: Payer: Self-pay

## 2019-11-09 ENCOUNTER — Encounter: Payer: Medicare Other | Admitting: *Deleted

## 2019-11-09 DIAGNOSIS — I272 Pulmonary hypertension, unspecified: Secondary | ICD-10-CM

## 2019-11-09 DIAGNOSIS — J45909 Unspecified asthma, uncomplicated: Secondary | ICD-10-CM | POA: Diagnosis not present

## 2019-11-09 DIAGNOSIS — J4550 Severe persistent asthma, uncomplicated: Secondary | ICD-10-CM

## 2019-11-09 NOTE — Progress Notes (Signed)
Daily Session Note  Patient Details  Name: Sharon Cervantes MRN: [id number] Date of Birth: 01/28/1951 Referring Provider:     Pulmonary Rehab from 10/11/2019 in [facility] Cardiac and Pulmonary Rehab  Referring Provider  Claudette Stapler MD      Encounter Date: 11/09/2019  Check In: Session Check In - 11/09/19 0745      Check-In   Supervising physician immediately available to respond to emergencies  See telemetry face sheet for immediately available ER MD    Location  [facility]-Cardiac & Pulmonary Rehab    Staff Present  Heath Lark, RN, BSN, CCRP;Jessica [place], MA, RCEP, CCRP, CCET;Joseph [place] RCP,RRT,BSRT    Virtual Visit  No    Medication changes reported      No    Fall or balance concerns reported     No    Warm-up and Cool-down  Performed on first and last piece of equipment    Resistance Training Performed  Yes    VAD Patient?  No    PAD/SET Patient?  No      Pain Assessment   Currently in Pain?  No/denies          Social History   Tobacco Use  Smoking Status Never Smoker  Smokeless Tobacco Never Used    Goals Met:  Proper associated with RPD/PD & O2 Sat Independence with exercise equipment Exercise tolerated well Strength training completed today  Goals Unmet:  Not Applicable  Comments: Pt able to follow exercise prescription today without complaint.  Will continue to monitor for progression.    Dr. Emily Filbert is Medical Director for [place] and [name].

## 2019-11-12 ENCOUNTER — Other Ambulatory Visit: Payer: Self-pay

## 2019-11-12 ENCOUNTER — Encounter: Payer: Medicare Other | Admitting: *Deleted

## 2019-11-12 DIAGNOSIS — I272 Pulmonary hypertension, unspecified: Secondary | ICD-10-CM

## 2019-11-12 DIAGNOSIS — J4550 Severe persistent asthma, uncomplicated: Secondary | ICD-10-CM

## 2019-11-12 DIAGNOSIS — J45909 Unspecified asthma, uncomplicated: Secondary | ICD-10-CM | POA: Diagnosis not present

## 2019-11-12 NOTE — Progress Notes (Signed)
Daily Session Note  Patient Details  Name: Sharon Cervantes MRN: [id number] Date of Birth: September 28, 1950 Referring Provider:     Pulmonary Rehab from 10/11/2019 in [facility] Cardiac and Pulmonary Rehab  Referring Provider  Claudette Stapler MD      Encounter Date: 11/12/2019  Check In: Session Check In - 11/12/19 0840      Check-In   Supervising physician immediately available to respond to emergencies  See telemetry face sheet for immediately available ER MD    Location  [facility]-Cardiac & Pulmonary Rehab    Staff Present  Heath Lark, RN, BSN, CCRP;Joseph Hood RCP,RRT,BSRT;Kelly [place], [place], ACSM CEP, Exercise Physiologist    Virtual Visit  No    Medication changes reported      No    Fall or balance concerns reported     No    Warm-up and Cool-down  Performed on first and last piece of equipment    Resistance Training Performed  Yes    VAD Patient?  No    PAD/SET Patient?  No      Pain Assessment   Currently in Pain?  No/denies          Social History   Tobacco Use  Smoking Status Never Smoker  Smokeless Tobacco Never Used    Goals Met:  Independence with exercise equipment Exercise tolerated well No report of cardiac concerns or symptoms  Goals Unmet:  Not Applicable  Comments: Pt able to follow exercise prescription today without complaint.  Will continue to monitor for progression.    Dr. Emily Filbert is Medical Director for [place] and [name].

## 2019-11-19 ENCOUNTER — Other Ambulatory Visit: Payer: Self-pay

## 2019-11-19 ENCOUNTER — Encounter: Payer: Medicare Other | Admitting: *Deleted

## 2019-11-19 DIAGNOSIS — J4550 Severe persistent asthma, uncomplicated: Secondary | ICD-10-CM

## 2019-11-19 DIAGNOSIS — J45909 Unspecified asthma, uncomplicated: Secondary | ICD-10-CM | POA: Diagnosis not present

## 2019-11-19 DIAGNOSIS — I272 Pulmonary hypertension, unspecified: Secondary | ICD-10-CM

## 2019-11-19 NOTE — Progress Notes (Signed)
Daily Session Note  Patient Details  Name: Sharon Cervantes MRN: [id number] Date of Birth: [birth year] Referring Provider:     Pulmonary Rehab from 10/11/2019 in [facility] Cardiac and Pulmonary Rehab  Referring Provider  Claudette Stapler MD      Encounter Date: 11/19/2019  Check In: Session Check In - 11/19/19 0755      Check-In   Supervising physician immediately available to respond to emergencies  See telemetry face sheet for immediately available ER MD    Location  [facility]-Cardiac & Pulmonary Rehab    Staff Present  Heath Lark, RN, BSN, [place], BS, ACSM CEP, Exercise Physiologist;Joseph Tessie Fass RCP,RRT,BSRT    Virtual Visit  No    Medication changes reported      No    Fall or balance concerns reported     No    Warm-up and Cool-down  Performed on first and last piece of equipment    Resistance Training Performed  Yes    VAD Patient?  No    PAD/SET Patient?  No      Pain Assessment   Currently in Pain?  No/denies          Social History   Tobacco Use  Smoking Status Never Smoker  Smokeless Tobacco Never Used    Goals Met:  Independence with exercise equipment Exercise tolerated well No report of cardiac concerns or symptoms  Goals Unmet:  Not Applicable  Comments: Pt able to follow exercise prescription today without complaint.  Will continue to monitor for progression.    Dr. Emily Filbert is Medical Director for [place] and [name].

## 2019-11-20 ENCOUNTER — Other Ambulatory Visit: Payer: Self-pay

## 2019-11-20 ENCOUNTER — Encounter: Payer: Medicare Other | Admitting: *Deleted

## 2019-11-20 DIAGNOSIS — J45909 Unspecified asthma, uncomplicated: Secondary | ICD-10-CM | POA: Diagnosis not present

## 2019-11-20 DIAGNOSIS — J4550 Severe persistent asthma, uncomplicated: Secondary | ICD-10-CM

## 2019-11-20 DIAGNOSIS — I272 Pulmonary hypertension, unspecified: Secondary | ICD-10-CM

## 2019-11-20 NOTE — Progress Notes (Signed)
Daily Session Note  Patient Details  Name: Sharon Cervantes MRN: [id number] Date of Birth: 13-Feb-1951 Referring Provider:     Pulmonary Rehab from 10/11/2019 in [facility] Cardiac and Pulmonary Rehab  Referring Provider  Claudette Stapler MD      Encounter Date: 11/20/2019  Check In: Session Check In - 11/20/19 0805      Check-In   Supervising physician immediately available to respond to emergencies  See telemetry face sheet for immediately available ER MD    Location  [facility]-Cardiac & Pulmonary Rehab    Staff Present  Renita Papa, RN BSN;Joseph [address] [place], [place], [place], CCRP, [place], [place], ACSM CEP, Exercise Physiologist    Virtual Visit  No    Medication changes reported      No    Fall or balance concerns reported     No    Warm-up and Cool-down  Performed on first and last piece of equipment    Resistance Training Performed  Yes    VAD Patient?  No    PAD/SET Patient?  No      Pain Assessment   Currently in Pain?  No/denies          Social History   Tobacco Use  Smoking Status Never Smoker  Smokeless Tobacco Never Used    Goals Met:  Independence with exercise equipment Exercise tolerated well No report of cardiac concerns or symptoms Strength training completed today  Goals Unmet:  Not Applicable  Comments: Pt able to follow exercise prescription today without complaint.  Will continue to monitor for progression.    Dr. Emily Filbert is Medical Director for [place] and [name].

## 2019-11-21 ENCOUNTER — Other Ambulatory Visit: Payer: Self-pay

## 2019-11-21 ENCOUNTER — Encounter: Payer: Medicare Other | Admitting: *Deleted

## 2019-11-21 ENCOUNTER — Encounter: Payer: Self-pay | Admitting: *Deleted

## 2019-11-21 DIAGNOSIS — J4550 Severe persistent asthma, uncomplicated: Secondary | ICD-10-CM

## 2019-11-21 DIAGNOSIS — I272 Pulmonary hypertension, unspecified: Secondary | ICD-10-CM

## 2019-11-21 DIAGNOSIS — J45909 Unspecified asthma, uncomplicated: Secondary | ICD-10-CM | POA: Diagnosis not present

## 2019-11-21 NOTE — Progress Notes (Signed)
Pulmonary Individual Treatment Plan  Patient Details  Name: Sharon Cervantes MRN: [id number] Date of Birth: April 21, 1951 Referring Provider:     Pulmonary Rehab from 10/11/2019 in [facility] Cardiac and Pulmonary Rehab  Referring Provider  Claudette Stapler MD      Initial Encounter Date:    Pulmonary Rehab from 10/11/2019 in [facility] Cardiac and Pulmonary Rehab  Date  10/11/19      Visit Diagnosis: Severe persistent asthma, unspecified whether complicated  Pulmonary HTN ([place])  Patient's Home Medications on Admission:  Current Outpatient Medications:  .  albuterol (PROVENTIL HFA;VENTOLIN HFA) 108 (90 Base) MCG/ACT inhaler, Inhale into the lungs., Disp: , Rfl:  .  budesonide (PULMICORT) 0.5 MG/2ML nebulizer solution, Inhale into the lungs., Disp: , Rfl:  .  budesonide (PULMICORT) 0.5 MG/2ML nebulizer solution, Inhale into the lungs., Disp: , Rfl:  .  cyclobenzaprine (FLEXERIL) 10 MG tablet, Take 1 tablet (10 mg total) by mouth 2 (two) times daily as needed for muscle spasms., Disp: 20 tablet, Rfl: 0 .  Dupilumab (DUPIXENT) 300 MG/2ML SOPN, INJECT 2 PENS UNDER THE SKIN ON DAY 1., Disp: , Rfl:  .  fluticasone (FLONASE) 50 MCG/ACT nasal spray, Place into the nose., Disp: , Rfl:  .  fluticasone (FLOVENT HFA) 220 MCG/ACT inhaler, Inhale into the lungs., Disp: , Rfl:  .  Fluticasone-Salmeterol (ADVAIR) 100-50 MCG/DOSE AEPB, Inhale 1 puff into the lungs 2 (two) times daily. , Disp: , Rfl:  .  hydrochlorothiazide (HYDRODIURIL) 12.5 MG tablet, Take by mouth., Disp: , Rfl:  .  ibuprofen (ADVIL) 800 MG tablet, Take 1 tablet (800 mg total) by mouth every 8 (eight) hours as needed., Disp: 21 tablet, Rfl: 0 .  levocetirizine (XYZAL) 5 MG tablet, Take 5 mg by mouth every evening., Disp: , Rfl:  .  LORazepam (ATIVAN) 0.5 MG tablet, Take 0.5 mg by mouth 2 (two) times daily as needed. , Disp: , Rfl:  .  meloxicam (MOBIC) 15 MG tablet, Take 15 mg by mouth daily. , Disp: , Rfl:  .  Misc Natural Products (GLUCOSAMINE CHOND  COMPLEX/MSM PO), Take by mouth 2 (two) times daily., Disp: , Rfl:  .  montelukast (SINGULAIR) 10 MG tablet, Take 1 tablet (10 mg total) by mouth at bedtime. appt further refills, Disp: 90 tablet, Rfl: 1 .  Multiple Vitamins-Minerals (ICAPS AREDS 2 PO), Take 1 capsule by mouth 2 (two) times daily., Disp: , Rfl:  .  olmesartan-hydrochlorothiazide (BENICAR HCT) 40-12.5 MG tablet, Take 1 tablet by mouth daily., Disp: , Rfl:  .  pramipexole (MIRAPEX) 1.5 MG tablet, Take 1.5 mg by mouth 3 (three) times daily., Disp: , Rfl:  .  TURMERIC CURCUMIN PO, Take by mouth daily., Disp: , Rfl:  .  venlafaxine XR (EFFEXOR-XR) 150 MG 24 hr capsule, Take 1 capsule (150 mg total) by mouth daily with breakfast., Disp: 90 capsule, Rfl: 3 .  VITAMIN A PO, Take 1 tablet by mouth daily., Disp: , Rfl:  .  VITAMIN D, CHOLECALCIFEROL, PO, Take by mouth., Disp: , Rfl:  No current facility-administered medications for this visit.  Facility-Administered Medications Ordered in Other Visits:  .  [COMPLETED] sodium chloride 0.9 % nebulizer solution 3 mL, 3 mL, Nebulization, Once, 3 mL at 10/03/18 1130 **FOLLOWED BY** [COMPLETED] methacholine (PROVOCHOLINE) inhaler solution 0.125 mg, 2 mL, Inhalation, Once, 0.125 mg at 10/03/18 1139 **FOLLOWED BY** methacholine (PROVOCHOLINE) inhaler solution 0.5 mg, 2 mL, Inhalation, Once **FOLLOWED BY** methacholine (PROVOCHOLINE) inhaler solution 2 mg, 2 mL, Inhalation, Once **FOLLOWED BY** methacholine (PROVOCHOLINE) inhaler  solution 8 mg, 2 mL, Inhalation, Once **FOLLOWED BY** methacholine (PROVOCHOLINE) inhaler solution 32 mg, 2 mL, Inhalation, Once **FOLLOWED BY** [COMPLETED] albuterol (PROVENTIL) (2.5 MG/3ML) 0.083% nebulizer solution 2.5 mg, 2.5 mg, Nebulization, Once, Ottie Glazier, MD, 2.5 mg at 10/03/18 1146  Past Medical History: Past Medical History:  Diagnosis Date  . Arthritis   . Asthma   . Chronic cough   . Colon polyps   . Depression   . GERD (gastroesophageal reflux disease)    . Hiatal hernia   . History of chicken pox   . Macular degeneration   . Multinodular goiter    FNA in past neg h/o thyroid cysts  . Pernicious anemia   . RLS (restless legs syndrome)     Tobacco Use: Social History   Tobacco Use  Smoking Status Never Smoker  Smokeless Tobacco Never Used    Labs: Recent Review Scientist, physiological    Labs for ITP Cardiac and Pulmonary Rehab Latest Ref Rng & Units 10/12/2018   Hemoglobin A1c 4.6 - 6.5 % 5.6       Pulmonary Assessment Scores: Pulmonary Assessment Scores    Row Name 10/11/19 1243         ADL UCSD   ADL Phase  Entry     SOB Score total  60     Rest  1     Walk  4     Stairs  4     Bath  1     Dress  1     Shop  4       CAT Score   CAT Score  25       mMRC Score   mMRC Score  4        UCSD: Self-administered rating of dyspnea associated with activities of daily living (ADLs) 6-point scale (0 = "not at all" to 5 = "maximal or unable to do because of breathlessness")  Scoring Scores range from 0 to 120.  Minimally important difference is 5 units  CAT: CAT can identify the health impairment of COPD patients and is better correlated with disease progression.  CAT has a scoring range of zero to 40. The CAT score is classified into four groups of low (less than 10), medium (10 - 20), high (21-30) and very high (31-40) based on the impact level of disease on health status. A CAT score over 10 suggests significant symptoms.  A worsening CAT score could be explained by an exacerbation, poor medication adherence, poor inhaler technique, or progression of COPD or comorbid conditions.  CAT MCID is 2 points  mMRC: mMRC (Modified Medical Research Council) Dyspnea Scale is used to assess the degree of baseline functional disability in patients of respiratory disease due to dyspnea. No minimal important difference is established. A decrease in score of 1 point or greater is considered a positive change.   Pulmonary Function  Assessment:   Exercise Target Goals: Exercise Program Goal: Individual exercise prescription set using results from initial 6 min walk test and THRR while considering  patient's activity barriers and safety.   Exercise Prescription Goal: Initial exercise prescription builds to 30-45 minutes a day of aerobic activity, 2-3 days per week.  Home exercise guidelines will be given to patient during program as part of exercise prescription that the participant will acknowledge.  Activity Barriers & Risk Stratification: Activity Barriers & Cardiac Risk Stratification - 10/11/19 1238      Activity Barriers & Cardiac Risk Stratification   Activity Barriers  Deconditioning;Joint Problems;Other (comment);Muscular Weakness;Arthritis;Shortness of Breath    Comments  R knee bone on bone needs surgery once able       6 Minute Walk: 6 Minute Walk    Row Name 10/11/19 1237         6 Minute Walk   Phase  Initial     Distance  910 feet     Walk Time  6 minutes     # of Rest Breaks  0     MPH  1.72     METS  1.73     RPE  12     Perceived Dyspnea   2     VO2 Peak  6.06     Symptoms  Yes (comment)     Comments  SOB     Resting HR  89 bpm     Resting BP  124/74     Resting Oxygen Saturation   97 %     Exercise Oxygen Saturation  during 6 min walk  93 %     Max Ex. HR  122 bpm     Max Ex. BP  148/76     2 Minute Post BP  130/72       Interval HR   1 Minute HR  107     2 Minute HR  113     3 Minute HR  119     4 Minute HR  121     5 Minute HR  122     6 Minute HR  117     2 Minute Post HR  97     Interval Heart Rate?  Yes       Interval Oxygen   Interval Oxygen?  Yes     Baseline Oxygen Saturation %  97 %     1 Minute Oxygen Saturation %  98 %     1 Minute Liters of Oxygen  0 L Room Air     2 Minute Oxygen Saturation %  94 %     2 Minute Liters of Oxygen  0 L     3 Minute Oxygen Saturation %  93 %     3 Minute Liters of Oxygen  0 L     4 Minute Oxygen Saturation %  94 %     4  Minute Liters of Oxygen  0 L     5 Minute Oxygen Saturation %  93 %     5 Minute Liters of Oxygen  0 L     6 Minute Oxygen Saturation %  94 %     6 Minute Liters of Oxygen  0 L     2 Minute Post Oxygen Saturation %  99 %     2 Minute Post Liters of Oxygen  0 L       Oxygen Initial Assessment: Oxygen Initial Assessment - 10/10/19 1418      Home Oxygen   Home Oxygen Device  None    Sleep Oxygen Prescription  None   was supposed to use CPAP but sent back as it was not working with asthma   Home Exercise Oxygen Prescription  None    Home at Rest Exercise Oxygen Prescription  None      Initial 6 min Walk   Oxygen Used  None      Program Oxygen Prescription   Program Oxygen Prescription  None      Intervention   Short Term Goals  To  learn and understand importance of monitoring SPO2 with pulse oximeter and demonstrate accurate use of the pulse oximeter.;To learn and understand importance of maintaining oxygen saturations>88%;To learn and demonstrate proper pursed lip breathing techniques or other breathing techniques.;To learn and demonstrate proper use of respiratory medications    Long  Term Goals  Verbalizes importance of monitoring SPO2 with pulse oximeter and return demonstration;Maintenance of O2 saturations>88%;Exhibits proper breathing techniques, such as pursed lip breathing or other method taught during program session;Compliance with respiratory medication;Demonstrates proper use of MDI's       Oxygen Re-Evaluation: Oxygen Re-Evaluation    Row Name 10/15/19 0817 10/29/19 0814           Program Oxygen Prescription   Program Oxygen Prescription  None  None        Home Oxygen   Home Oxygen Device  None  None      Sleep Oxygen Prescription  None  None      Home Exercise Oxygen Prescription  None  None      Home at Rest Exercise Oxygen Prescription  None  None      Compliance with Home Oxygen Use  -- NA  --        Goals/Expected Outcomes   Short Term Goals  To learn  and understand importance of monitoring SPO2 with pulse oximeter and demonstrate accurate use of the pulse oximeter.;To learn and understand importance of maintaining oxygen saturations>88%;To learn and demonstrate proper pursed lip breathing techniques or other breathing techniques.  To learn and understand importance of monitoring SPO2 with pulse oximeter and demonstrate accurate use of the pulse oximeter.;To learn and understand importance of maintaining oxygen saturations>88%;To learn and demonstrate proper pursed lip breathing techniques or other breathing techniques.      Long  Term Goals  Exhibits compliance with exercise, home and travel O2 prescription;Verbalizes importance of monitoring SPO2 with pulse oximeter and return demonstration;Maintenance of O2 saturations>88%;Exhibits proper breathing techniques, such as pursed lip breathing or other method taught during program session  Verbalizes importance of monitoring SPO2 with pulse oximeter and return demonstration;Maintenance of O2 saturations>88%;Exhibits proper breathing techniques, such as pursed lip breathing or other method taught during program session      Comments  Reviewed PLB technique with pt.  Talked about how it works and it's importance in maintaining their exercise saturations.  Sharon Cervantes is doing well with her PLB and has gotten a pulse oximeter to monitor her oxygen levels at home.  So far they are doing well.      Goals/Expected Outcomes  Short: Become more profiecient at using PLB.   Long: Become independent at using PLB.  Short: Continue to ues PLB to help with  SOB in AM.  Long: Continue to monitor oxygen levels.         Oxygen Discharge (Final Oxygen Re-Evaluation): Oxygen Re-Evaluation - 10/29/19 0814      Program Oxygen Prescription   Program Oxygen Prescription  None      Home Oxygen   Home Oxygen Device  None    Sleep Oxygen Prescription  None    Home Exercise Oxygen Prescription  None    Home at Rest Exercise  Oxygen Prescription  None      Goals/Expected Outcomes   Short Term Goals  To learn and understand importance of monitoring SPO2 with pulse oximeter and demonstrate accurate use of the pulse oximeter.;To learn and understand importance of maintaining oxygen saturations>88%;To learn and demonstrate proper pursed lip breathing techniques or other breathing  techniques.    Long  Term Goals  Verbalizes importance of monitoring SPO2 with pulse oximeter and return demonstration;Maintenance of O2 saturations>88%;Exhibits proper breathing techniques, such as pursed lip breathing or other method taught during program session    Comments  Sharon Cervantes is doing well with her PLB and has gotten a pulse oximeter to monitor her oxygen levels at home.  So far they are doing well.    Goals/Expected Outcomes  Short: Continue to ues PLB to help with  SOB in AM.  Long: Continue to monitor oxygen levels.       Initial Exercise Prescription: Initial Exercise Prescription - 10/11/19 1200      Date of Initial Exercise RX and Referring Provider   Date  10/11/19    Referring Provider  Claudette Stapler MD      Treadmill   MPH  1.5    Grade  0.5    Minutes  15    METs  2.25      NuStep   Level  1    SPM  80    Minutes  15    METs  2      T5 Nustep   Level  1    SPM  80    Minutes  15    METs  2      Biostep-RELP   Level  1    SPM  50    Minutes  15    METs  2      Prescription Details   Frequency (times per week)  3    Duration  Progress to 30 minutes of continuous aerobic without signs/symptoms of physical distress      Intensity   THRR 40-80% of Max Heartrate  114-139    Ratings of Perceived Exertion  11-13    Perceived Dyspnea  0-4      Progression   Progression  Continue to progress workloads to maintain intensity without signs/symptoms of physical distress.      Resistance Training   Training Prescription  Yes    Weight  3 lb    Reps  10-15       Perform Capillary Blood Glucose checks  as needed.  Exercise Prescription Changes: Exercise Prescription Changes    Row Name 10/11/19 1200 10/17/19 0900 10/23/19 0800 10/31/19 1400 11/15/19 0800     Response to Exercise   Blood Pressure (Admit)  124/74  138/88  --  126/74  100/60   Blood Pressure (Exercise)  148/76  152/68  --  110/64  102/70   Blood Pressure (Exit)  130/72  130/78  --  126/70  122/60   Heart Rate (Admit)  89 bpm  81 bpm  --  82 bpm  83 bpm   Heart Rate (Exercise)  122 bpm  114 bpm  --  90 bpm  98 bpm   Heart Rate (Exit)  91 bpm  90 bpm  --  70 bpm  --   Oxygen Saturation (Admit)  97 %  96 %  --  96 %  96 %   Oxygen Saturation (Exercise)  93 %  97 %  --  93 %  94 %   Oxygen Saturation (Exit)  98 %  98 %  --  96 %  94 %   Rating of Perceived Exertion (Exercise)  12  13  --  13  12   Perceived Dyspnea (Exercise)  2  3  --  1  1  Symptoms  SOb  --  --  none  none   Comments  walk test results  second day   --  --  --   Duration  --  Continue with 30 min of aerobic exercise without signs/symptoms of physical distress.  --  Continue with 30 min of aerobic exercise without signs/symptoms of physical distress.  Continue with 30 min of aerobic exercise without signs/symptoms of physical distress.   Intensity  --  THRR unchanged  --  THRR unchanged  THRR unchanged     Progression   Progression  --  Continue to progress workloads to maintain intensity without signs/symptoms of physical distress.  --  Continue to progress workloads to maintain intensity without signs/symptoms of physical distress.  Continue to progress workloads to maintain intensity without signs/symptoms of physical distress.   Average METs  --  2  --  2.06  2.8     Resistance Training   Training Prescription  --  Yes  --  Yes  Yes   Weight  --  3 lb  --  3 lb   5 lb   Reps  --  10-15  --  10-15  10-15     Interval Training   Interval Training  --  No  --  No  No     Treadmill   MPH  --  1.5  --  1.5  --   Grade  --  0.5  --  0.5  --    Minutes  --  15  --  15  --   METs  --  2.25  --  2.25  --     NuStep   Level  --  --  --  2  6   SPM  --  --  --  --  80   Minutes  --  --  --  15  15   METs  --  --  --  2  3.6     T5 Nustep   Level  --  3  --  3  --   SPM  --  80  --  --  --   Minutes  --  15  --  15  --   METs  --  2  --  2  --     Biostep-RELP   Level  --  --  --  4  6   SPM  --  --  --  --  50   Minutes  --  --  --  15  15   METs  --  --  --  2  2     Home Exercise Plan   Plans to continue exercise at  --  --  [employer] (comment) walking, gym, [employer] (comment) walking, gym, [employer] (comment) walking, gym, [employer]  --  --  Add 2 additional days to program exercise sessions.  Add 2 additional days to program exercise sessions.  Add 2 additional days to program exercise sessions.   Initial Home Exercises Provided  --  --  10/23/19  10/23/19  10/23/19      Exercise Comments: Exercise Comments    Row Name 10/15/19 0759           Exercise Comments  First full day of exercise!  Patient was oriented to gym and equipment including functions, settings, policies, and procedures.  Patient's individual exercise prescription and treatment plan  were reviewed.  All starting workloads were established based on the results of the 6 minute walk test done at initial orientation visit.  The plan for exercise progression was also introduced and progression will be customized based on patient's performance and goals.          Exercise Goals and Review: Exercise Goals    Row Name 10/11/19 1241             Exercise Goals   Increase Physical Activity  Yes       Intervention  Provide advice, education, support and counseling about physical activity/exercise needs.;Develop an individualized exercise prescription for aerobic and resistive training based on initial evaluation findings, risk stratification, comorbidities and participant's personal goals.       Expected  Outcomes  Short Term: Attend rehab on a regular basis to increase amount of physical activity.;Long Term: Add in home exercise to make exercise part of routine and to increase amount of physical activity.;Long Term: Exercising regularly at least 3-5 days a week.       Increase Strength and Stamina  Yes       Intervention  Provide advice, education, support and counseling about physical activity/exercise needs.;Develop an individualized exercise prescription for aerobic and resistive training based on initial evaluation findings, risk stratification, comorbidities and participant's personal goals.       Expected Outcomes  Short Term: Increase workloads from initial exercise prescription for resistance, speed, and METs.;Short Term: Perform resistance training exercises routinely during rehab and add in resistance training at home;Long Term: Improve cardiorespiratory fitness, muscular endurance and strength as measured by increased METs and functional capacity (6MWT)       Able to understand and use rate of perceived exertion (RPE) scale  Yes       Intervention  Provide education and explanation on how to use RPE scale       Expected Outcomes  Short Term: Able to use RPE daily in rehab to express subjective intensity level;Long Term:  Able to use RPE to guide intensity level when exercising independently       Able to understand and use Dyspnea scale  Yes       Intervention  Provide education and explanation on how to use Dyspnea scale       Expected Outcomes  Short Term: Able to use Dyspnea scale daily in rehab to express subjective sense of shortness of breath during exertion;Long Term: Able to use Dyspnea scale to guide intensity level when exercising independently       Knowledge and understanding of Target Heart Rate Range (THRR)  Yes       Intervention  Provide education and explanation of THRR including how the numbers were predicted and where they are located for reference       Expected Outcomes   Short Term: Able to state/look up THRR;Long Term: Able to use THRR to govern intensity when exercising independently;Short Term: Able to use daily as guideline for intensity in rehab       Able to check pulse independently  Yes       Intervention  Provide education and demonstration on how to check pulse in carotid and radial arteries.;Review the importance of being able to check your own pulse for safety during independent exercise       Expected Outcomes  Short Term: Able to explain why pulse checking is important during independent exercise;Long Term: Able to check pulse independently and accurately       Understanding of  Exercise Prescription  Yes       Intervention  Provide education, explanation, and written materials on patient's individual exercise prescription       Expected Outcomes  Short Term: Able to explain program exercise prescription;Long Term: Able to explain home exercise prescription to exercise independently          Exercise Goals Re-Evaluation : Exercise Goals Re-Evaluation    Row Name 10/15/19 0815 10/23/19 0804 10/29/19 0809 10/31/19 1415 11/15/19 0857     Exercise Goal Re-Evaluation   Exercise Goals Review  Able to understand and use Dyspnea scale;Able to understand and use rate of perceived exertion (RPE) scale;Knowledge and understanding of Target Heart Rate Range (THRR);Understanding of Exercise Prescription  Able to understand and use Dyspnea scale;Able to understand and use rate of perceived exertion (RPE) scale;Knowledge and understanding of Target Heart Rate Range (THRR);Understanding of Exercise Prescription;Increase Physical Activity;Increase Strength and Stamina;Able to check pulse independently  Increase Physical Activity;Increase Strength and Stamina;Understanding of Exercise Prescription  Increase Physical Activity;Increase Strength and Stamina;Understanding of Exercise Prescription  Increase Physical Activity;Increase Strength and Stamina;Able to understand and  use rate of perceived exertion (RPE) scale;Able to understand and use Dyspnea scale;Knowledge and understanding of Target Heart Rate Range (THRR);Able to check pulse independently;Understanding of Exercise Prescription   Comments  Reviewed RPE scale, THR and program prescription with pt today.  Pt voiced understanding and was given a copy of goals to take home.  Reviewed home exercise with pt today.  Pt plans to walk, go to [facility], and use videos for exercise.  She is planning to get a pulse oximeter from [employer]. Reviewed THR, pulse, RPE, sign and symptoms, and when to call 911 or MD.  Also discussed weather considerations and indoor options.  Pt voiced understanding.  Sharon Cervantes is off to good start.  She is already getting in her weights at house and going to gym at apartment complex.  She has gotten her pulse oximeter to monitor herself on treadmill at home.  Sharon Cervantes has been doing well in rehab. She is up to level 4 on the BioStep.  We will continue to monitor her progress.  Sharon Cervantes has movd up to level 6 on NS and Biostep.  She is up to 5 lb for strength work.   Expected Outcomes  Short: Use RPE daily to regulate intensity. Long: Follow program prescription in THR.  Short: Start to add in exercise at home.  Long: Continue to improve stamina.  Short: Continue to exercise on off days.  Long; Continue to attend regularly.  Short: Increase workload on NuStep.  Long: Continue to exercise on off days.  Short : continue to exercise consistently Long:  increase MET level      Discharge Exercise Prescription (Final Exercise Prescription Changes): Exercise Prescription Changes - 11/15/19 0800      Response to Exercise   Blood Pressure (Admit)  100/60    Blood Pressure (Exercise)  102/70    Blood Pressure (Exit)  122/60    Heart Rate (Admit)  83 bpm    Heart Rate (Exercise)  98 bpm    Oxygen Saturation (Admit)  96 %    Oxygen Saturation (Exercise)  94 %    Oxygen Saturation (Exit)  94 %    Rating of Perceived  Exertion (Exercise)  12    Perceived Dyspnea (Exercise)  1    Symptoms  none    Duration  Continue with 30 min of aerobic exercise without signs/symptoms of physical distress.  Intensity  THRR unchanged      Progression   Progression  Continue to progress workloads to maintain intensity without signs/symptoms of physical distress.    Average METs  2.8      Resistance Training   Training Prescription  Yes    Weight   5 lb    Reps  10-15      Interval Training   Interval Training  No      NuStep   Level  6    SPM  80    Minutes  15    METs  3.6      Biostep-RELP   Level  6    SPM  50    Minutes  15    METs  2      Home Exercise Plan   Plans to continue exercise at  [employer] (comment)   walking, gym, [employer]   Frequency  Add 2 additional days to program exercise sessions.    Initial Home Exercises Provided  10/23/19       Nutrition:  [employer] Goals: Understanding of nutrition guidelines, daily intake of sodium <1575m, cholesterol <2028m calories 30% from fat and 7% or less from saturated fats, daily to have 5 or more servings of fruits and vegetables.  Biometrics: Pre Biometrics - 10/11/19 1241      Pre Biometrics   Height  5' 6.75" (1.695 m)    Weight  273 lb 14.4 oz (124.2 kg)    BMI (Calculated)  43.24    Single Leg Stand  6.76 seconds        Nutrition Therapy Plan and Nutrition Goals: Nutrition Therapy & Goals - 11/08/19 1009      Nutrition Therapy   Diet  HH, Low Na    Protein (specify units)  100-105g    Fiber  25 grams    Whole Grain Foods  3 servings    Saturated Fats  12 max. grams    Fruits and Vegetables  5 servings/day    Sodium  1.5 grams      Personal Nutrition Goals   Nutrition Goal  ST: repleace honey wheat with 100% wheat LT: eating better, weight loss    Comments  B instant grits, egg (scrambled) with bacon, peanut butter with natures own honey wheat and skippy's honey. tea - diet tea or coffee - cream, stevia. L: sandwich (pb  or cheese or ham) D: salad (chicken caesar salad packet) with walnuts, bacon bits, extra cheese, seasame sticks. S: a few ritz crackers or kettle corn (rice cakes). Drinks: diet tea. trying to add more water (will usually add crystal lite). Pt reports not feeling hungry. energy improved since starting rehab 6-7/10 (was 0/10). Discussed HH eating.      Intervention Plan   Intervention  Prescribe, educate and counsel regarding individualized specific dietary modifications aiming towards targeted core components such as weight, hypertension, lipid management, diabetes, heart failure and other comorbidities.;Nutrition handout(s) given to patient.    Expected Outcomes  Short Term Goal: Understand basic principles of dietary content, such as calories, fat, sodium, cholesterol and nutrients.;Short Term Goal: A plan has been developed with personal nutrition goals set during dietitian appointment.;Long Term Goal: Adherence to prescribed nutrition plan.       Nutrition Assessments: Nutrition Assessments - 10/11/19 1242      MEDFICTS Scores   Pre Score  29       Nutrition Goals Re-Evaluation:   Nutrition Goals Discharge (Final Nutrition Goals Re-Evaluation):  Psychosocial: Target Goals: Acknowledge presence or absence of significant depression and/or stress, maximize coping skills, provide positive support system. Participant is able to verbalize types and ability to use techniques and skills needed for reducing stress and depression.   Initial Review & Psychosocial Screening: Initial Psych Review & Screening - 10/10/19 1420      Initial Review   Current issues with  Current Stress Concerns;History of Depression;Current Depression    Source of Stress Concerns  Chronic Illness;Financial    Comments  long history of pulm disease, finances are stressful and in a day to day mangament phase, not currently having symptoms      Family Dynamics   Good Support System?  No   friends [facility] and  [place]) able to call when needed   Concerns  Inappropriate over/under dependence on family/friends      Barriers   Psychosocial barriers to participate in program  The patient should benefit from training in stress management and relaxation.;Psychosocial barriers identified (see note)      Screening Interventions   Interventions  Provide feedback about the scores to participant;To provide support and resources with identified psychosocial needs;Encouraged to exercise    Expected Outcomes  Short Term goal: Utilizing psychosocial counselor, staff and physician to assist with identification of specific Stressors or current issues interfering with healing process. Setting desired goal for each stressor or current issue identified.;Long Term Goal: Stressors or current issues are controlled or eliminated.;Short Term goal: Identification and review with participant of any Quality of Life or Depression concerns found by scoring the questionnaire.;Long Term goal: The participant improves quality of Life and PHQ9 Scores as seen by post scores and/or verbalization of changes       Quality of Life Scores:  Scores of 19 and below usually indicate a poorer quality of life in these areas.  A difference of  2-3 points is a clinically meaningful difference.  A difference of 2-3 points in the total score of the Quality of Life Index has been associated with significant improvement in overall quality of life, self-image, physical symptoms, and general health in studies assessing change in quality of life.  PHQ-9: Recent Review Flowsheet Data    Depression screen [facility] 2/9 10/29/2019 10/11/2019 05/21/2019   Decreased Interest 0 1 0   Down, Depressed, Hopeless 0 1 0   PHQ - 2 Score 0 2 0   Altered sleeping 0 3 0   Tired, decreased energy 0 3 0   Change in appetite 0 3 0   Feeling bad or failure about yourself  0 1 0   Trouble concentrating 0 1 0   Moving slowly or fidgety/restless 0 0 0   Suicidal thoughts 0 0 0    PHQ-9 Score 0 13 0   Difficult doing work/chores Not difficult at all Somewhat difficult Not difficult at all     Interpretation of Total Score  Total Score Depression Severity:  1-4 = Minimal depression, 5-9 = Mild depression, 10-14 = Moderate depression, 15-19 = Moderately severe depression, 20-27 = Severe depression   Psychosocial Evaluation and Intervention: Psychosocial Evaluation - 10/10/19 1428      Psychosocial Evaluation & Interventions   Interventions  Stress management education;Encouraged to exercise with the program and follow exercise prescription    Comments  Sharon Cervantes is coming into pulmonary rehab with hx of asthma and pulmonary hypertension.  She lives by herself but has friends in [place] that she can call on for support.  She has a history  of depression with some good days and bad days, mostly good recently.  She is feeling pretty good overall.  She is looking forward to rehab and wants to get back to feeling better. She wants to be able to breathe better and lose weight so that she can go walking again!!  She does have some financial strain being on her own and we talked about letting us know if it becomes a burden and she needs help.    Expected Outcomes  Short: Attend rehab to build stamina and work on weight loss  Long: Stay positive and get to walking again.       Psychosocial Re-Evaluation: Psychosocial Re-Evaluation    Row Name 10/29/19 [phone]             Psychosocial Re-Evaluation   Current issues with  Current Stress Concerns;Current Sleep Concerns       Comments  PHQ improved to 0!!  She is feeling better overall.  Her sleep is still disrupted by her coughing, but overall it is good.  She denies symptoms of depression and attributes her improvements to getting back to working out and coming to class!       Expected Outcomes  Short: Continue to exercise for her mentally health.  Long: Continue to boost mental health.       Interventions  Encouraged to attend  Pulmonary Rehabilitation for the exercise       Continue Psychosocial Services   Follow up required by staff          Psychosocial Discharge (Final Psychosocial Re-Evaluation): Psychosocial Re-Evaluation - 10/29/19 0812      Psychosocial Re-Evaluation   Current issues with  Current Stress Concerns;Current Sleep Concerns    Comments  PHQ improved to 0!!  She is feeling better overall.  Her sleep is still disrupted by her coughing, but overall it is good.  She denies symptoms of depression and attributes her improvements to getting back to working out and coming to class!    Expected Outcomes  Short: Continue to exercise for her mentally health.  Long: Continue to boost mental health.    Interventions  Encouraged to attend Pulmonary Rehabilitation for the exercise    Continue Psychosocial Services   Follow up required by staff       Education: Education Goals: Education classes will be provided on a weekly basis, covering required topics. Participant will state understanding/return demonstration of topics presented.  Learning Barriers/Preferences:   Education Topics:  Initial Evaluation Education: - Verbal, written and demonstration of respiratory meds, oximetry and breathing techniques. Instruction on use of nebulizers and MDIs and importance of monitoring MDI activations.   Pulmonary Rehab from 10/11/2019 in [facility] Cardiac and Pulmonary Rehab  Date  10/11/19  Educator  [id number]  Instruction Review Code  1- Verbalizes Understanding      General Nutrition Guidelines/Fats and Fiber: -Group instruction provided by verbal, written material, models and posters to present the general guidelines for heart healthy nutrition. Gives an explanation and review of dietary fats and fiber.   Controlling Sodium/Reading Food Labels: -Group verbal and written material supporting the discussion of sodium use in heart healthy nutrition. Review and explanation with models, verbal and written materials for  utilization of the food label.   Exercise Physiology & General Exercise Guidelines: - Group verbal and written instruction with models to review the exercise physiology of the cardiovascular system and associated critical values. Provides general exercise guidelines with specific guidelines to those with heart or lung  disease.    Aerobic Exercise & Resistance Training: - Gives group verbal and written instruction on the various components of exercise. Focuses on aerobic and resistive training programs and the benefits of this training and how to safely progress through these programs.   Flexibility, Balance, Mind/Body Relaxation: Provides group verbal/written instruction on the benefits of flexibility and balance training, including mind/body exercise modes such as yoga, pilates and tai chi.  Demonstration and skill practice provided.   Stress and Anxiety: - Provides group verbal and written instruction about the health risks of elevated stress and causes of high stress.  Discuss the correlation between heart/lung disease and anxiety and treatment options. Review healthy ways to manage with stress and anxiety.   Depression: - Provides group verbal and written instruction on the correlation between heart/lung disease and depressed mood, treatment options, and the stigmas associated with seeking treatment.   Exercise & Equipment Safety: - Individual verbal instruction and demonstration of equipment use and safety with use of the equipment.   Pulmonary Rehab from 10/11/2019 in [facility] Cardiac and Pulmonary Rehab  Date  10/11/19  Educator  [facility]  Instruction Review Code  1- Verbalizes Understanding      Infection Prevention: - Provides verbal and written material to individual with discussion of infection control including proper hand washing and proper equipment cleaning during exercise session.   Pulmonary Rehab from 10/11/2019 in [facility] Cardiac and Pulmonary Rehab  Date  10/11/19  Educator   [facility]  Instruction Review Code  1- Verbalizes Understanding      Falls Prevention: - Provides verbal and written material to individual with discussion of falls prevention and safety.   Pulmonary Rehab from 10/11/2019 in [facility] Cardiac and Pulmonary Rehab  Date  10/11/19  Educator  [facility]  Instruction Review Code  1- Verbalizes Understanding      Diabetes: - Individual verbal and written instruction to review signs/symptoms of diabetes, desired ranges of glucose level fasting, after meals and with exercise. Advice that pre and post exercise glucose checks will be done for 3 sessions at entry of program.   Chronic Lung Diseases: - Group verbal and written instruction to review updates, respiratory medications, advancements in procedures and treatments. Discuss use of supplemental oxygen including available portable oxygen systems, continuous and intermittent flow rates, concentrators, personal use and safety guidelines. Review proper use of inhaler and spacers. Provide informative websites for self-education.    Energy Conservation: - Provide group verbal and written instruction for methods to conserve energy, plan and organize activities. Instruct on pacing techniques, use of adaptive equipment and posture/positioning to relieve shortness of breath.   Triggers and Exacerbations: - Group verbal and written instruction to review types of environmental triggers and ways to prevent exacerbations. Discuss weather changes, air quality and the benefits of nasal washing. Review warning signs and symptoms to help prevent infections. Discuss techniques for effective airway clearance, coughing, and vibrations.   AED/CPR: - Group verbal and written instruction with the use of models to demonstrate the basic use of the AED with the basic ABC's of resuscitation.   Anatomy and Physiology of the Lungs: - Group verbal and written instruction with the use of models to provide basic lung anatomy and physiology  related to function, structure and complications of lung disease.   Anatomy & Physiology of the Heart: - Group verbal and written instruction and models provide basic cardiac anatomy and physiology, with the coronary electrical and arterial systems. Review of Valvular disease and Heart Failure  Cardiac Medications: - Group verbal and written instruction to review commonly prescribed medications for heart disease. Reviews the medication, class of the drug, and side effects.   Know Your Numbers and Risk Factors: -Group verbal and written instruction about important numbers in your health.  Discussion of what are risk factors and how they play a role in the disease process.  Review of Cholesterol, Blood Pressure, Diabetes, and BMI and the role they play in your overall health.   Sleep Hygiene: -Provides group verbal and written instruction about how sleep can affect your health.  Define sleep hygiene, discuss sleep cycles and impact of sleep habits. Review good sleep hygiene tips.    Other: -Provides group and verbal instruction on various topics (see comments)    Knowledge Questionnaire Score: Knowledge Questionnaire Score - 10/11/19 1242      Knowledge Questionnaire Score   Pre Score  17/18 Education: O2 saturations        Core Components/Risk Factors/Patient Goals at Admission: Personal Goals and Risk Factors at Admission - 10/11/19 1242      Core Components/Risk Factors/Patient Goals on Admission    Weight Management  Yes;Obesity;Weight Loss    Intervention  Weight Management: Develop a combined nutrition and exercise program designed to reach desired caloric intake, while maintaining appropriate intake of nutrient and fiber, sodium and fats, and appropriate energy expenditure required for the weight goal.;Weight Management/Obesity: Establish reasonable short term and long term weight goals.;Weight Management: Provide education and appropriate resources to help participant work  on and attain dietary goals.;Obesity: Provide education and appropriate resources to help participant work on and attain dietary goals.    Admit Weight  273 lb 14.4 oz (124.2 kg)    Goal Weight: Short Term  268 lb (121.6 kg)    Goal Weight: Long Term  260 lb (117.9 kg)    Expected Outcomes  Short Term: Continue to assess and modify interventions until short term weight is achieved;Long Term: Adherence to nutrition and physical activity/exercise program aimed toward attainment of established weight goal;Weight Loss: Understanding of general recommendations for a balanced deficit meal plan, which promotes 1-2 lb weight loss per week and includes a negative energy balance of [phone] kcal/d;Understanding recommendations for meals to include 15-35% energy as protein, 25-35% energy from fat, 35-60% energy from carbohydrates, less than 236m of dietary cholesterol, 20-35 gm of total fiber daily;Understanding of distribution of calorie intake throughout the day with the consumption of 4-5 meals/snacks    Improve shortness of breath with ADL's  Yes    Intervention  Provide education, individualized exercise plan and daily activity instruction to help decrease symptoms of SOB with activities of daily living.    Expected Outcomes  Short Term: Improve cardiorespiratory fitness to achieve a reduction of symptoms when performing ADLs;Long Term: Be able to perform more ADLs without symptoms or delay the onset of symptoms    Hypertension  Yes    Intervention  Provide education on lifestyle modifcations including regular physical activity/exercise, weight management, moderate sodium restriction and increased consumption of fresh fruit, vegetables, and low fat dairy, alcohol moderation, and smoking cessation.;Monitor prescription use compliance.    Expected Outcomes  Long Term: Maintenance of blood pressure at goal levels.;Short Term: Continued assessment and intervention until BP is < 140/970mHG in hypertensive  participants. < 130/8046mG in hypertensive participants with diabetes, heart failure or chronic kidney disease.    Lipids  Yes    Intervention  Provide education and support for participant on nutrition &  aerobic/resistive exercise along with prescribed medications to achieve LDL <84m, HDL >462m    Expected Outcomes  Short Term: Participant states understanding of desired cholesterol values and is compliant with medications prescribed. Participant is following exercise prescription and nutrition guidelines.;Long Term: Cholesterol controlled with medications as prescribed, with individualized exercise RX and with personalized nutrition plan. Value goals: LDL < 7073mHDL > 40 mg.       Core Components/Risk Factors/Patient Goals Review:  Goals and Risk Factor Review    Row Name 10/29/19 0810             Core Components/Risk Factors/Patient Goals Review   Personal Goals Review  Weight Management/Obesity;Improve shortness of breath with ADL's;Hypertension;Lipids       Review  Sharon Cervantes already starting to lose weight and feeling good despite not eating as well while out of town.  Blood pressures have been good and she usually checks them at home.  Meds are good.  SOB in morning but it does improve as day goes by.       Expected Outcomes  Short: Continue to work SOB and weight loss.  Long: Continue to monitor risk factors.          Core Components/Risk Factors/Patient Goals at Discharge (Final Review):  Goals and Risk Factor Review - 10/29/19 0810      Core Components/Risk Factors/Patient Goals Review   Personal Goals Review  Weight Management/Obesity;Improve shortness of breath with ADL's;Hypertension;Lipids    Review  Sharon Cervantes already starting to lose weight and feeling good despite not eating as well while out of town.  Blood pressures have been good and she usually checks them at home.  Meds are good.  SOB in morning but it does improve as day goes by.    Expected Outcomes  Short:  Continue to work SOB and weight loss.  Long: Continue to monitor risk factors.       ITP Comments: ITP Comments    Row Name 10/10/19 1439 10/11/19 1229 10/15/19 0759 10/24/19 0632 11/08/19 1135   ITP Comments  Completed virtual orientation today.  EP eval scheduled for 2/11 at 11am.  Documentation for diagnosis can be found in CE encounter 10/03/19.  Completed 6MWT and gym orientation.  Initial ITP created and sent for review to Dr. MarEmily Filbertedical Director.  First full day of exercise!  Patient was oriented to gym and equipment including functions, settings, policies, and procedures.  Patient's individual exercise prescription and treatment plan were reviewed.  All starting workloads were established based on the results of the 6 minute walk test done at initial orientation visit.  The plan for exercise progression was also introduced and progression will be customized based on patient's performance and goals.  30 day chart review completed. ITP sent to Dr M MZachery Dakinsdical Director, for review,changes as needed and signature.  Completed Initial RD Eval   Row Name 11/21/19 0635           ITP Comments  30 day chart review completed. ITP sent to Dr M MZachery Dakinsdical Director, for review,changes as needed and signature. Continue with ITP if no changes requested          Comments:

## 2019-11-21 NOTE — Progress Notes (Signed)
Daily Session Note  Patient Details  Name: Sharon Cervantes MRN: [id number] Date of Birth: March 25, 1951 Referring Provider:     Pulmonary Rehab from 10/11/2019 in [facility] Cardiac and Pulmonary Rehab  Referring Provider  Claudette Stapler MD      Encounter Date: 11/21/2019  Check In: Session Check In - 11/21/19 0902      Check-In   Supervising physician immediately available to respond to emergencies  See telemetry face sheet for immediately available ER MD    Location  [facility]-Cardiac & Pulmonary Rehab    Staff Present  Heath Lark, RN, BSN, CCRP;Joseph Foy [place], [place], ACSM CEP, Exercise Physiologist    Virtual Visit  No    Medication changes reported      No    Fall or balance concerns reported     No    Warm-up and Cool-down  Performed on first and last piece of equipment    Resistance Training Performed  Yes    VAD Patient?  No    PAD/SET Patient?  No      Pain Assessment   Currently in Pain?  No/denies          Social History   Tobacco Use  Smoking Status Never Smoker  Smokeless Tobacco Never Used    Goals Met:  Proper associated with RPD/PD & O2 Sat Independence with exercise equipment Exercise tolerated well No report of cardiac concerns or symptoms  Goals Unmet:  Not Applicable  Comments: Pt able to follow exercise prescription today without complaint.  Will continue to monitor for progression.    Dr. Emily Filbert is Medical Director for [place] and [name].

## 2019-11-28 ENCOUNTER — Encounter: Payer: Medicare Other | Admitting: *Deleted

## 2019-11-28 ENCOUNTER — Other Ambulatory Visit: Payer: Self-pay

## 2019-11-28 DIAGNOSIS — J45909 Unspecified asthma, uncomplicated: Secondary | ICD-10-CM | POA: Diagnosis not present

## 2019-11-28 DIAGNOSIS — I272 Pulmonary hypertension, unspecified: Secondary | ICD-10-CM

## 2019-11-28 DIAGNOSIS — J4550 Severe persistent asthma, uncomplicated: Secondary | ICD-10-CM

## 2019-11-28 NOTE — Progress Notes (Signed)
Daily Session Note  Patient Details  Name: Makynleigh Breslin MRN: [id number] Date of Birth: [birth year] Referring Provider:     Pulmonary Rehab from 10/11/2019 in [facility] Cardiac and Pulmonary Rehab  Referring Provider  Claudette Stapler MD      Encounter Date: 11/28/2019  Check In: Session Check In - 11/28/19 0840      Check-In   Supervising physician immediately available to respond to emergencies  See telemetry face sheet for immediately available ER MD    Location  [facility]-Cardiac & Pulmonary Rehab    Staff Present  Heath Lark, RN, BSN, CCRP;Melissa Caiola RDN, LDN;Joseph Hood RCP,RRT,BSRT    Virtual Visit  No    Medication changes reported      No    Fall or balance concerns reported     No    Warm-up and Cool-down  Performed on first and last piece of equipment    Resistance Training Performed  Yes    VAD Patient?  No    PAD/SET Patient?  No      Pain Assessment   Currently in Pain?  No/denies          Social History   Tobacco Use  Smoking Status Never Smoker  Smokeless Tobacco Never Used    Goals Met:  Proper associated with RPD/PD & O2 Sat Independence with exercise equipment Exercise tolerated well No report of cardiac concerns or symptoms  Goals Unmet:  Not Applicable  Comments: Pt able to follow exercise prescription today without complaint.  Will continue to monitor for progression.    Dr. Emily Filbert is Medical Director for [place] and [name].

## 2019-12-03 ENCOUNTER — Other Ambulatory Visit: Payer: Self-pay

## 2019-12-03 ENCOUNTER — Encounter: Payer: Medicare Other | Attending: Pulmonary Disease | Admitting: *Deleted

## 2019-12-03 DIAGNOSIS — J45909 Unspecified asthma, uncomplicated: Secondary | ICD-10-CM | POA: Diagnosis not present

## 2019-12-03 DIAGNOSIS — I272 Pulmonary hypertension, unspecified: Secondary | ICD-10-CM | POA: Diagnosis not present

## 2019-12-03 DIAGNOSIS — J4550 Severe persistent asthma, uncomplicated: Secondary | ICD-10-CM

## 2019-12-03 NOTE — Progress Notes (Signed)
Daily Session Note  Patient Details  Name: Sharon Cervantes MRN: [id number] Date of Birth: 12/01/1950 Referring Provider:     Pulmonary Rehab from 10/11/2019 in [facility] Cardiac and Pulmonary Rehab  Referring Provider  Claudette Stapler MD      Encounter Date: 12/03/2019  Check In: Session Check In - 12/03/19 0843      Check-In   Supervising physician immediately available to respond to emergencies  See telemetry face sheet for immediately available ER MD    Location  [facility]-Cardiac & Pulmonary Rehab    Staff Present  Heath Lark, RN, BSN, Laveda Norman, BS, ACSM CEP, Exercise Physiologist;Joseph Tessie Fass RCP,RRT,BSRT    Virtual Visit  No    Medication changes reported      No    Fall or balance concerns reported     No    Tobacco Cessation  Use Increase    Warm-up and Cool-down  Performed on first and last piece of equipment    Resistance Training Performed  Yes    VAD Patient?  No    PAD/SET Patient?  No      Pain Assessment   Currently in Pain?  No/denies          Social History   Tobacco Use  Smoking Status Never Smoker  Smokeless Tobacco Never Used    Goals Met:  Proper associated with RPD/PD & O2 Sat Independence with exercise equipment Exercise tolerated well No report of cardiac concerns or symptoms  Goals Unmet:  Not Applicable  Comments: Pt able to follow exercise prescription today without complaint.  Will continue to monitor for progression.    Dr. Emily Filbert is Medical Director for [place] and [name].

## 2019-12-05 ENCOUNTER — Other Ambulatory Visit: Payer: Self-pay

## 2019-12-05 ENCOUNTER — Encounter: Payer: Medicare Other | Admitting: *Deleted

## 2019-12-05 ENCOUNTER — Other Ambulatory Visit: Payer: Self-pay | Admitting: Orthopedic Surgery

## 2019-12-05 DIAGNOSIS — J45909 Unspecified asthma, uncomplicated: Secondary | ICD-10-CM | POA: Diagnosis not present

## 2019-12-05 DIAGNOSIS — J4550 Severe persistent asthma, uncomplicated: Secondary | ICD-10-CM

## 2019-12-05 DIAGNOSIS — M1711 Unilateral primary osteoarthritis, right knee: Secondary | ICD-10-CM

## 2019-12-05 NOTE — Progress Notes (Signed)
Daily Session Note  Patient Details  Name: Sharon Cervantes MRN: [id number] Date of Birth: [birth year] Referring Provider:     Pulmonary Rehab from 10/11/2019 in [facility] Cardiac and Pulmonary Rehab  Referring Provider  Claudette Stapler MD      Encounter Date: 12/05/2019  Check In:      Social History   Tobacco Use  Smoking Status Never Smoker  Smokeless Tobacco Never Used    Goals Met:  Independence with exercise equipment Exercise tolerated well No report of cardiac concerns or symptoms  Goals Unmet:  Not Applicable  Comments: Pt able to follow exercise prescription today without complaint.  Will continue to monitor for progression.   Dr. Emily Filbert is Medical Director for [place] and [name].

## 2019-12-06 ENCOUNTER — Ambulatory Visit
Admission: RE | Admit: 2019-12-06 | Discharge: 2019-12-06 | Disposition: A | Discharge status: Home / Self Care | Payer: Medicare Other | Source: Ambulatory Visit | Attending: Orthopedic Surgery | Admitting: Orthopedic Surgery

## 2019-12-06 ENCOUNTER — Other Ambulatory Visit: Payer: Self-pay

## 2019-12-06 DIAGNOSIS — M1711 Unilateral primary osteoarthritis, right knee: Secondary | ICD-10-CM | POA: Insufficient documentation

## 2019-12-07 ENCOUNTER — Encounter: Payer: Medicare Other | Admitting: *Deleted

## 2019-12-07 DIAGNOSIS — I272 Pulmonary hypertension, unspecified: Secondary | ICD-10-CM

## 2019-12-07 DIAGNOSIS — J4550 Severe persistent asthma, uncomplicated: Secondary | ICD-10-CM

## 2019-12-07 DIAGNOSIS — J45909 Unspecified asthma, uncomplicated: Secondary | ICD-10-CM | POA: Diagnosis not present

## 2019-12-07 NOTE — Progress Notes (Signed)
Daily Session Note  Patient Details  Name: Sharon Cervantes MRN: [id number] Date of Birth: October 05, 1950 Referring Provider:     Pulmonary Rehab from 10/11/2019 in [facility] Cardiac and Pulmonary Rehab  Referring Provider  Claudette Stapler MD      Encounter Date: 12/07/2019  Check In: Session Check In - 12/07/19 0836      Check-In   Supervising physician immediately available to respond to emergencies  See telemetry face sheet for immediately available ER MD    Location  [facility]-Cardiac & Pulmonary Rehab    Staff Present  Heath Lark, RN, BSN, Lance Sell, BA, ACSM CEP, Exercise Physiologist;Joseph Hood RCP,RRT,BSRT    Virtual Visit  No    Medication changes reported      No    Fall or balance concerns reported     No    Warm-up and Cool-down  Performed on first and last piece of equipment    Resistance Training Performed  Yes    VAD Patient?  No    PAD/SET Patient?  No      Pain Assessment   Currently in Pain?  No/denies          Social History   Tobacco Use  Smoking Status Never Smoker  Smokeless Tobacco Never Used    Goals Met:  Independence with exercise equipment Exercise tolerated well No report of cardiac concerns or symptoms  Goals Unmet:  Not Applicable  Comments: Pt able to follow exercise prescription today without complaint.  Will continue to monitor for progression.    Dr. Emily Filbert is Medical Director for [facility] and [name].

## 2019-12-10 ENCOUNTER — Encounter: Payer: Medicare Other | Admitting: *Deleted

## 2019-12-10 ENCOUNTER — Other Ambulatory Visit: Payer: Self-pay

## 2019-12-10 DIAGNOSIS — J45909 Unspecified asthma, uncomplicated: Secondary | ICD-10-CM | POA: Diagnosis not present

## 2019-12-10 DIAGNOSIS — J4550 Severe persistent asthma, uncomplicated: Secondary | ICD-10-CM

## 2019-12-10 DIAGNOSIS — I272 Pulmonary hypertension, unspecified: Secondary | ICD-10-CM

## 2019-12-10 NOTE — Progress Notes (Signed)
Daily Session Note  Patient Details  Name: Sharon Cervantes MRN: [id number] Date of Birth: 01-14-1951 Referring Provider:     Pulmonary Rehab from 10/11/2019 in [facility] Cardiac and Pulmonary Rehab  Referring Provider  Claudette Stapler MD      Encounter Date: 12/10/2019  Check In: Session Check In - 12/10/19 0932      Check-In   Supervising physician immediately available to respond to emergencies  See telemetry face sheet for immediately available ER MD    Location  [facility]-Cardiac & Pulmonary Rehab    Staff Present  Heath Lark, RN, BSN, Laveda Norman, BS, ACSM CEP, Exercise Physiologist;Joseph Tessie Fass RCP,RRT,BSRT    Virtual Visit  No    Fall or balance concerns reported     No    Warm-up and Cool-down  Performed on first and last piece of equipment    Resistance Training Performed  Yes    VAD Patient?  No    PAD/SET Patient?  No      Pain Assessment   Currently in Pain?  No/denies          Social History   Tobacco Use  Smoking Status Never Smoker  Smokeless Tobacco Never Used    Goals Met:  Proper associated with RPD/PD & O2 Sat Independence with exercise equipment Exercise tolerated well Personal goals reviewed No report of cardiac concerns or symptoms  Goals Unmet:  Not Applicable  Comments: Pt able to follow exercise prescription today without complaint.  Will continue to monitor for progression.    Dr. Emily Filbert is Medical Director for [place] and [name].

## 2019-12-13 ENCOUNTER — Other Ambulatory Visit: Payer: Self-pay

## 2019-12-13 ENCOUNTER — Encounter: Payer: Medicare Other | Admitting: *Deleted

## 2019-12-13 DIAGNOSIS — I272 Pulmonary hypertension, unspecified: Secondary | ICD-10-CM

## 2019-12-13 DIAGNOSIS — J45909 Unspecified asthma, uncomplicated: Secondary | ICD-10-CM | POA: Diagnosis not present

## 2019-12-13 DIAGNOSIS — J4550 Severe persistent asthma, uncomplicated: Secondary | ICD-10-CM

## 2019-12-13 NOTE — Progress Notes (Signed)
Daily Session Note  Patient Details  Name: Sharon Cervantes MRN: [id number] Date of Birth: [birth year] Referring Provider:     Pulmonary Rehab from 10/11/2019 in [facility] Cardiac and Pulmonary Rehab  Referring Provider  Claudette Stapler MD      Encounter Date: 12/13/2019  Check In: Session Check In - 12/13/19 0806      Check-In   Supervising physician immediately available to respond to emergencies  See telemetry face sheet for immediately available ER MD    Location  [facility]-Cardiac & Pulmonary Rehab    Staff Present  Heath Lark, RN, BSN, CCRP;Laureen Owens Shark, BS, RRT, CPFT;Amanda Sommer, BA, ACSM CEP, Exercise Physiologist    Virtual Visit  No    Medication changes reported     Yes  Indomethacin ER 75 Mg 2 x a day   Fall or balance concerns reported     No    Warm-up and Cool-down  Performed on first and last piece of equipment    Resistance Training Performed  Yes    VAD Patient?  No    PAD/SET Patient?  No      Pain Assessment   Currently in Pain?  No/denies          Social History   Tobacco Use  Smoking Status Never Smoker  Smokeless Tobacco Never Used    Goals Met:  Proper associated with RPD/PD & O2 Sat Independence with exercise equipment Exercise tolerated well No report of cardiac concerns or symptoms  Goals Unmet:  Not Applicable  Comments: Pt able to follow exercise prescription today without complaint.  Will continue to monitor for progression.    Dr. Emily Filbert is Medical Director for [place] and [name].

## 2019-12-14 ENCOUNTER — Other Ambulatory Visit: Payer: Self-pay

## 2019-12-14 DIAGNOSIS — J45909 Unspecified asthma, uncomplicated: Secondary | ICD-10-CM | POA: Diagnosis not present

## 2019-12-14 DIAGNOSIS — J4550 Severe persistent asthma, uncomplicated: Secondary | ICD-10-CM

## 2019-12-14 DIAGNOSIS — I272 Pulmonary hypertension, unspecified: Secondary | ICD-10-CM

## 2019-12-14 NOTE — Progress Notes (Signed)
Daily Session Note  Patient Details  Name: Sharon Cervantes MRN: [id number] Date of Birth: Aug 26, 1951 Referring Provider:     Pulmonary Rehab from 10/11/2019 in [facility] Cardiac and Pulmonary Rehab  Referring Provider  Claudette Stapler MD      Encounter Date: 12/14/2019  Check In: Session Check In - 12/14/19 0750      Check-In   Supervising physician immediately available to respond to emergencies  See telemetry face sheet for immediately available ER MD    Location  [facility]-Cardiac & Pulmonary Rehab    Staff Present  Vida Rigger RN, BSN;Jessica Luan Pulling, MA, RCEP, CCRP, CCET;Joseph Hood RCP,RRT,BSRT    Virtual Visit  No    Medication changes reported      No    Fall or balance concerns reported     No    Warm-up and Cool-down  Performed on first and last piece of equipment    Resistance Training Performed  Yes    VAD Patient?  No    PAD/SET Patient?  No      Pain Assessment   Currently in Pain?  No/denies    Multiple Pain Sites  No          Social History   Tobacco Use  Smoking Status Never Smoker  Smokeless Tobacco Never Used    Goals Met:  Proper associated with RPD/PD & O2 Sat Independence with exercise equipment Using PLB without cueing & demonstrates good technique Exercise tolerated well No report of cardiac concerns or symptoms Strength training completed today  Goals Unmet:  Not Applicable  Comments: Pt able to follow exercise prescription today without complaint.  Will continue to monitor for progression.   Dr. Emily Filbert is Medical Director for [place] and [name].

## 2019-12-17 ENCOUNTER — Encounter: Payer: Medicare Other | Admitting: *Deleted

## 2019-12-17 ENCOUNTER — Other Ambulatory Visit: Payer: Self-pay

## 2019-12-17 DIAGNOSIS — J45909 Unspecified asthma, uncomplicated: Secondary | ICD-10-CM | POA: Diagnosis not present

## 2019-12-17 DIAGNOSIS — I272 Pulmonary hypertension, unspecified: Secondary | ICD-10-CM

## 2019-12-17 DIAGNOSIS — J4550 Severe persistent asthma, uncomplicated: Secondary | ICD-10-CM

## 2019-12-17 NOTE — Progress Notes (Signed)
Daily Session Note  Patient Details  Name: Sharon Cervantes MRN: [id number] Date of Birth: 1951-08-06 Referring Provider:     Pulmonary Rehab from 10/11/2019 in [facility] Cardiac and Pulmonary Rehab  Referring Provider  Claudette Stapler MD      Encounter Date: 12/17/2019  Check In: Session Check In - 12/17/19 0814      Check-In   Supervising physician immediately available to respond to emergencies  See telemetry face sheet for immediately available ER MD    Location  [facility]-Cardiac & Pulmonary Rehab    Staff Present  Heath Lark, RN, BSN, Laveda Norman, BS, ACSM CEP, Exercise Physiologist;Joseph Tessie Fass RCP,RRT,BSRT    Virtual Visit  No    Medication changes reported      No    Fall or balance concerns reported     No    Warm-up and Cool-down  Performed on first and last piece of equipment    Resistance Training Performed  Yes    VAD Patient?  No    PAD/SET Patient?  No      Pain Assessment   Currently in Pain?  No/denies          Social History   Tobacco Use  Smoking Status Never Smoker  Smokeless Tobacco Never Used    Goals Met:  Proper associated with RPD/PD & O2 Sat Independence with exercise equipment Exercise tolerated well No report of cardiac concerns or symptoms  Goals Unmet:  Not Applicable  Comments: Pt able to follow exercise prescription today without complaint.  Will continue to monitor for progression.    Dr. Emily Filbert is Medical Director for [facility] and [name].

## 2019-12-19 ENCOUNTER — Encounter: Payer: Self-pay | Admitting: *Deleted

## 2019-12-19 ENCOUNTER — Other Ambulatory Visit: Payer: Self-pay

## 2019-12-19 ENCOUNTER — Encounter: Payer: Medicare Other | Admitting: *Deleted

## 2019-12-19 DIAGNOSIS — J4550 Severe persistent asthma, uncomplicated: Secondary | ICD-10-CM

## 2019-12-19 DIAGNOSIS — I272 Pulmonary hypertension, unspecified: Secondary | ICD-10-CM

## 2019-12-19 DIAGNOSIS — J45909 Unspecified asthma, uncomplicated: Secondary | ICD-10-CM | POA: Diagnosis not present

## 2019-12-19 NOTE — Progress Notes (Signed)
Pulmonary Individual Treatment Plan  Patient Details  Name: Anabel Lykins MRN: [id number] Date of Birth: Apr 09, 1951 Referring Provider:     Pulmonary Rehab from 10/11/2019 in [facility] Cardiac and Pulmonary Rehab  Referring Provider  Claudette Stapler MD      Initial Encounter Date:    Pulmonary Rehab from 10/11/2019 in [facility] Cardiac and Pulmonary Rehab  Date  10/11/19      Visit Diagnosis: Pulmonary HTN ([place])  Severe persistent asthma, unspecified whether complicated  Patient's Home Medications on Admission:  Current Outpatient Medications:  .  albuterol (PROVENTIL HFA;VENTOLIN HFA) 108 (90 Base) MCG/ACT inhaler, Inhale into the lungs., Disp: , Rfl:  .  budesonide (PULMICORT) 0.5 MG/2ML nebulizer solution, Inhale into the lungs., Disp: , Rfl:  .  budesonide (PULMICORT) 0.5 MG/2ML nebulizer solution, Inhale into the lungs., Disp: , Rfl:  .  cyclobenzaprine (FLEXERIL) 10 MG tablet, Take 1 tablet (10 mg total) by mouth 2 (two) times daily as needed for muscle spasms., Disp: 20 tablet, Rfl: 0 .  Dupilumab (DUPIXENT) 300 MG/2ML SOPN, INJECT 2 PENS UNDER THE SKIN ON DAY 1., Disp: , Rfl:  .  fluticasone (FLONASE) 50 MCG/ACT nasal spray, Place into the nose., Disp: , Rfl:  .  fluticasone (FLOVENT HFA) 220 MCG/ACT inhaler, Inhale into the lungs., Disp: , Rfl:  .  Fluticasone-Salmeterol (ADVAIR) 100-50 MCG/DOSE AEPB, Inhale 1 puff into the lungs 2 (two) times daily. , Disp: , Rfl:  .  hydrochlorothiazide (HYDRODIURIL) 12.5 MG tablet, Take by mouth., Disp: , Rfl:  .  ibuprofen (ADVIL) 800 MG tablet, Take 1 tablet (800 mg total) by mouth every 8 (eight) hours as needed., Disp: 21 tablet, Rfl: 0 .  levocetirizine (XYZAL) 5 MG tablet, Take 5 mg by mouth every evening., Disp: , Rfl:  .  LORazepam (ATIVAN) 0.5 MG tablet, Take 0.5 mg by mouth 2 (two) times daily as needed. , Disp: , Rfl:  .  meloxicam (MOBIC) 15 MG tablet, Take 15 mg by mouth daily. , Disp: , Rfl:  .  Misc Natural Products (GLUCOSAMINE CHOND  COMPLEX/MSM PO), Take by mouth 2 (two) times daily., Disp: , Rfl:  .  montelukast (SINGULAIR) 10 MG tablet, Take 1 tablet (10 mg total) by mouth at bedtime. appt further refills, Disp: 90 tablet, Rfl: 1 .  Multiple Vitamins-Minerals (ICAPS AREDS 2 PO), Take 1 capsule by mouth 2 (two) times daily., Disp: , Rfl:  .  olmesartan-hydrochlorothiazide (BENICAR HCT) 40-12.5 MG tablet, Take 1 tablet by mouth daily., Disp: , Rfl:  .  pramipexole (MIRAPEX) 1.5 MG tablet, Take 1.5 mg by mouth 3 (three) times daily., Disp: , Rfl:  .  TURMERIC CURCUMIN PO, Take by mouth daily., Disp: , Rfl:  .  venlafaxine XR (EFFEXOR-XR) 150 MG 24 hr capsule, Take 1 capsule (150 mg total) by mouth daily with breakfast., Disp: 90 capsule, Rfl: 3 .  VITAMIN A PO, Take 1 tablet by mouth daily., Disp: , Rfl:  .  VITAMIN D, CHOLECALCIFEROL, PO, Take by mouth., Disp: , Rfl:  No current facility-administered medications for this visit.  Facility-Administered Medications Ordered in Other Visits:  .  [COMPLETED] sodium chloride 0.9 % nebulizer solution 3 mL, 3 mL, Nebulization, Once, 3 mL at 10/03/18 1130 **FOLLOWED BY** [COMPLETED] methacholine (PROVOCHOLINE) inhaler solution 0.125 mg, 2 mL, Inhalation, Once, 0.125 mg at 10/03/18 1139 **FOLLOWED BY** methacholine (PROVOCHOLINE) inhaler solution 0.5 mg, 2 mL, Inhalation, Once **FOLLOWED BY** methacholine (PROVOCHOLINE) inhaler solution 2 mg, 2 mL, Inhalation, Once **FOLLOWED BY** methacholine (PROVOCHOLINE) inhaler  solution 8 mg, 2 mL, Inhalation, Once **FOLLOWED BY** methacholine (PROVOCHOLINE) inhaler solution 32 mg, 2 mL, Inhalation, Once **FOLLOWED BY** [COMPLETED] albuterol (PROVENTIL) (2.5 MG/3ML) 0.083% nebulizer solution 2.5 mg, 2.5 mg, Nebulization, Once, Ottie Glazier, MD, 2.5 mg at 10/03/18 1146  Past Medical History: Past Medical History:  Diagnosis Date  . Arthritis   . Asthma   . Chronic cough   . Colon polyps   . Depression   . GERD (gastroesophageal reflux disease)    . Hiatal hernia   . History of chicken pox   . Macular degeneration   . Multinodular goiter    FNA in past neg h/o thyroid cysts  . Pernicious anemia   . RLS (restless legs syndrome)     Tobacco Use: Social History   Tobacco Use  Smoking Status Never Smoker  Smokeless Tobacco Never Used    Labs: Recent Review Scientist, physiological    Labs for ITP Cardiac and Pulmonary Rehab Latest Ref Rng & Units 10/12/2018   Hemoglobin A1c 4.6 - 6.5 % 5.6       Pulmonary Assessment Scores: Pulmonary Assessment Scores    Row Name 10/11/19 1243         ADL UCSD   ADL Phase  Entry     SOB Score total  60     Rest  1     Walk  4     Stairs  4     Bath  1     Dress  1     Shop  4       CAT Score   CAT Score  25       mMRC Score   mMRC Score  4        UCSD: Self-administered rating of dyspnea associated with activities of daily living (ADLs) 6-point scale (0 = "not at all" to 5 = "maximal or unable to do because of breathlessness")  Scoring Scores range from 0 to 120.  Minimally important difference is 5 units  CAT: CAT can identify the health impairment of COPD patients and is better correlated with disease progression.  CAT has a scoring range of zero to 40. The CAT score is classified into four groups of low (less than 10), medium (10 - 20), high (21-30) and very high (31-40) based on the impact level of disease on health status. A CAT score over 10 suggests significant symptoms.  A worsening CAT score could be explained by an exacerbation, poor medication adherence, poor inhaler technique, or progression of COPD or comorbid conditions.  CAT MCID is 2 points  mMRC: mMRC (Modified Medical Research Council) Dyspnea Scale is used to assess the degree of baseline functional disability in patients of respiratory disease due to dyspnea. No minimal important difference is established. A decrease in score of 1 point or greater is considered a positive change.   Pulmonary Function  Assessment:   Exercise Target Goals: Exercise Program Goal: Individual exercise prescription set using results from initial 6 min walk test and THRR while considering  patient's activity barriers and safety.   Exercise Prescription Goal: Initial exercise prescription builds to 30-45 minutes a day of aerobic activity, 2-3 days per week.  Home exercise guidelines will be given to patient during program as part of exercise prescription that the participant will acknowledge.  Education: Aerobic Exercise & Resistance Training: - Gives group verbal and written instruction on the various components of exercise. Focuses on aerobic and resistive training programs and the benefits  of this training and how to safely progress through these programs..   Education: Exercise & Equipment Safety: - Individual verbal instruction and demonstration of equipment use and safety with use of the equipment.   Pulmonary Rehab from 10/11/2019 in [facility] Cardiac and Pulmonary Rehab  Date  10/11/19  Educator  [facility]  Instruction Review Code  1- Verbalizes Understanding      Education: Exercise Physiology & General Exercise Guidelines: - Group verbal and written instruction with models to review the exercise physiology of the cardiovascular system and associated critical values. Provides general exercise guidelines with specific guidelines to those with heart or lung disease.    Education: Flexibility, Balance, Mind/Body Relaxation: Provides group verbal/written instruction on the benefits of flexibility and balance training, including mind/body exercise modes such as yoga, pilates and tai chi.  Demonstration and skill practice provided.   Activity Barriers & Risk Stratification: Activity Barriers & Cardiac Risk Stratification - 10/11/19 1238      Activity Barriers & Cardiac Risk Stratification   Activity Barriers  Deconditioning;Joint Problems;Other (comment);Muscular Weakness;Arthritis;Shortness of Breath     Comments  R knee bone on bone needs surgery once able       6 Minute Walk: 6 Minute Walk    Row Name 10/11/19 1237         6 Minute Walk   Phase  Initial     Distance  910 feet     Walk Time  6 minutes     # of Rest Breaks  0     MPH  1.72     METS  1.73     RPE  12     Perceived Dyspnea   2     VO2 Peak  6.06     Symptoms  Yes (comment)     Comments  SOB     Resting HR  89 bpm     Resting BP  124/74     Resting Oxygen Saturation   97 %     Exercise Oxygen Saturation  during 6 min walk  93 %     Max Ex. HR  122 bpm     Max Ex. BP  148/76     2 Minute Post BP  130/72       Interval HR   1 Minute HR  107     2 Minute HR  113     3 Minute HR  119     4 Minute HR  121     5 Minute HR  122     6 Minute HR  117     2 Minute Post HR  97     Interval Heart Rate?  Yes       Interval Oxygen   Interval Oxygen?  Yes     Baseline Oxygen Saturation %  97 %     1 Minute Oxygen Saturation %  98 %     1 Minute Liters of Oxygen  0 L Room Air     2 Minute Oxygen Saturation %  94 %     2 Minute Liters of Oxygen  0 L     3 Minute Oxygen Saturation %  93 %     3 Minute Liters of Oxygen  0 L     4 Minute Oxygen Saturation %  94 %     4 Minute Liters of Oxygen  0 L     5 Minute Oxygen Saturation %  93 %  5 Minute Liters of Oxygen  0 L     6 Minute Oxygen Saturation %  94 %     6 Minute Liters of Oxygen  0 L     2 Minute Post Oxygen Saturation %  99 %     2 Minute Post Liters of Oxygen  0 L       Oxygen Initial Assessment: Oxygen Initial Assessment - 10/10/19 1418      Home Oxygen   Home Oxygen Device  None    Sleep Oxygen Prescription  None   was supposed to use CPAP but sent back as it was not working with asthma   Home Exercise Oxygen Prescription  None    Home at Rest Exercise Oxygen Prescription  None      Initial 6 min Walk   Oxygen Used  None      Program Oxygen Prescription   Program Oxygen Prescription  None      Intervention   Short Term Goals  To learn  and understand importance of monitoring SPO2 with pulse oximeter and demonstrate accurate use of the pulse oximeter.;To learn and understand importance of maintaining oxygen saturations>88%;To learn and demonstrate proper pursed lip breathing techniques or other breathing techniques.;To learn and demonstrate proper use of respiratory medications    Long  Term Goals  Verbalizes importance of monitoring SPO2 with pulse oximeter and return demonstration;Maintenance of O2 saturations>88%;Exhibits proper breathing techniques, such as pursed lip breathing or other method taught during program session;Compliance with respiratory medication;Demonstrates proper use of MDI's       Oxygen Re-Evaluation: Oxygen Re-Evaluation    Row Name 10/15/19 0817 10/29/19 0814 12/10/19 0752         Program Oxygen Prescription   Program Oxygen Prescription  None  None  None       Home Oxygen   Home Oxygen Device  None  None  None     Sleep Oxygen Prescription  None  None  None     Home Exercise Oxygen Prescription  None  None  None     Home at Rest Exercise Oxygen Prescription  None  None  None     Compliance with Home Oxygen Use  -- NA  --  --       Goals/Expected Outcomes   Short Term Goals  To learn and understand importance of monitoring SPO2 with pulse oximeter and demonstrate accurate use of the pulse oximeter.;To learn and understand importance of maintaining oxygen saturations>88%;To learn and demonstrate proper pursed lip breathing techniques or other breathing techniques.  To learn and understand importance of monitoring SPO2 with pulse oximeter and demonstrate accurate use of the pulse oximeter.;To learn and understand importance of maintaining oxygen saturations>88%;To learn and demonstrate proper pursed lip breathing techniques or other breathing techniques.  To learn and understand importance of monitoring SPO2 with pulse oximeter and demonstrate accurate use of the pulse oximeter.;To learn and  understand importance of maintaining oxygen saturations>88%;To learn and demonstrate proper pursed lip breathing techniques or other breathing techniques.;To learn and demonstrate proper use of respiratory medications     Long  Term Goals  Exhibits compliance with exercise, home and travel O2 prescription;Verbalizes importance of monitoring SPO2 with pulse oximeter and return demonstration;Maintenance of O2 saturations>88%;Exhibits proper breathing techniques, such as pursed lip breathing or other method taught during program session  Verbalizes importance of monitoring SPO2 with pulse oximeter and return demonstration;Maintenance of O2 saturations>88%;Exhibits proper breathing techniques, such as pursed lip breathing or other method  taught during program session  Verbalizes importance of monitoring SPO2 with pulse oximeter and return demonstration;Maintenance of O2 saturations>88%;Exhibits proper breathing techniques, such as pursed lip breathing or other method taught during program session;Compliance with respiratory medication;Demonstrates proper use of MDI's     Comments  Reviewed PLB technique with pt.  Talked about how it works and it's importance in maintaining their exercise saturations.  Dunya is doing well with her PLB and has gotten a pulse oximeter to monitor her oxygen levels at home.  So far they are doing well.  Antoniette has noticed that her breathing has gotten worse over the last couple of days and she has needed to do a breathing treatment to help.  It is enough that she does not need her inhaler.  She is working on her PLB and it is getting better.     Goals/Expected Outcomes  Short: Become more profiecient at using PLB.   Long: Become independent at using PLB.  Short: Continue to ues PLB to help with  SOB in AM.  Long: Continue to monitor oxygen levels.  Short: Continue to use PLB and breathing treatments to help.  Long: Continue to monitor oxygen and breathing at home.        Oxygen  Discharge (Final Oxygen Re-Evaluation): Oxygen Re-Evaluation - 12/10/19 0752      Program Oxygen Prescription   Program Oxygen Prescription  None      Home Oxygen   Home Oxygen Device  None    Sleep Oxygen Prescription  None    Home Exercise Oxygen Prescription  None    Home at Rest Exercise Oxygen Prescription  None      Goals/Expected Outcomes   Short Term Goals  To learn and understand importance of monitoring SPO2 with pulse oximeter and demonstrate accurate use of the pulse oximeter.;To learn and understand importance of maintaining oxygen saturations>88%;To learn and demonstrate proper pursed lip breathing techniques or other breathing techniques.;To learn and demonstrate proper use of respiratory medications    Long  Term Goals  Verbalizes importance of monitoring SPO2 with pulse oximeter and return demonstration;Maintenance of O2 saturations>88%;Exhibits proper breathing techniques, such as pursed lip breathing or other method taught during program session;Compliance with respiratory medication;Demonstrates proper use of MDI's    Comments  Danali has noticed that her breathing has gotten worse over the last couple of days and she has needed to do a breathing treatment to help.  It is enough that she does not need her inhaler.  She is working on her PLB and it is getting better.    Goals/Expected Outcomes  Short: Continue to use PLB and breathing treatments to help.  Long: Continue to monitor oxygen and breathing at home.       Initial Exercise Prescription: Initial Exercise Prescription - 10/11/19 1200      Date of Initial Exercise RX and Referring Provider   Date  10/11/19    Referring Provider  Claudette Stapler MD      Treadmill   MPH  1.5    Grade  0.5    Minutes  15    METs  2.25      NuStep   Level  1    SPM  80    Minutes  15    METs  2      T5 Nustep   Level  1    SPM  80    Minutes  15    METs  2  Biostep-RELP   Level  1    SPM  50    Minutes  15     METs  2      Prescription Details   Frequency (times per week)  3    Duration  Progress to 30 minutes of continuous aerobic without signs/symptoms of physical distress      Intensity   THRR 40-80% of Max Heartrate  114-139    Ratings of Perceived Exertion  11-13    Perceived Dyspnea  0-4      Progression   Progression  Continue to progress workloads to maintain intensity without signs/symptoms of physical distress.      Resistance Training   Training Prescription  Yes    Weight  3 lb    Reps  10-15       Perform Capillary Blood Glucose checks as needed.  Exercise Prescription Changes: Exercise Prescription Changes    Row Name 10/11/19 1200 10/17/19 0900 10/23/19 0800 10/31/19 1400 11/15/19 0800     Response to Exercise   Blood Pressure (Admit)  124/74  138/88  --  126/74  100/60   Blood Pressure (Exercise)  148/76  152/68  --  110/64  102/70   Blood Pressure (Exit)  130/72  130/78  --  126/70  122/60   Heart Rate (Admit)  89 bpm  81 bpm  --  82 bpm  83 bpm   Heart Rate (Exercise)  122 bpm  114 bpm  --  90 bpm  98 bpm   Heart Rate (Exit)  91 bpm  90 bpm  --  70 bpm  --   Oxygen Saturation (Admit)  97 %  96 %  --  96 %  96 %   Oxygen Saturation (Exercise)  93 %  97 %  --  93 %  94 %   Oxygen Saturation (Exit)  98 %  98 %  --  96 %  94 %   Rating of Perceived Exertion (Exercise)  12  13  --  13  12   Perceived Dyspnea (Exercise)  2  3  --  1  1   Symptoms  SOb  --  --  none  none   Comments  walk test results  second day   --  --  --   Duration  --  Continue with 30 min of aerobic exercise without signs/symptoms of physical distress.  --  Continue with 30 min of aerobic exercise without signs/symptoms of physical distress.  Continue with 30 min of aerobic exercise without signs/symptoms of physical distress.   Intensity  --  THRR unchanged  --  THRR unchanged  THRR unchanged     Progression   Progression  --  Continue to progress workloads to maintain intensity without  signs/symptoms of physical distress.  --  Continue to progress workloads to maintain intensity without signs/symptoms of physical distress.  Continue to progress workloads to maintain intensity without signs/symptoms of physical distress.   Average METs  --  2  --  2.06  2.8     Resistance Training   Training Prescription  --  Yes  --  Yes  Yes   Weight  --  3 lb  --  3 lb   5 lb   Reps  --  10-15  --  10-15  10-15     Interval Training   Interval Training  --  No  --  No  No  Treadmill   MPH  --  1.5  --  1.5  --   Grade  --  0.5  --  0.5  --   Minutes  --  15  --  15  --   METs  --  2.25  --  2.25  --     NuStep   Level  --  --  --  2  6   SPM  --  --  --  --  80   Minutes  --  --  --  15  15   METs  --  --  --  2  3.6     T5 Nustep   Level  --  3  --  3  --   SPM  --  80  --  --  --   Minutes  --  15  --  15  --   METs  --  2  --  2  --     Biostep-RELP   Level  --  --  --  4  6   SPM  --  --  --  --  50   Minutes  --  --  --  15  15   METs  --  --  --  2  2     Home Exercise Plan   Plans to continue exercise at  --  --  [employer] (comment) walking, gym, [employer] (comment) walking, gym, [employer] (comment) walking, gym, [employer]  --  --  Add 2 additional days to program exercise sessions.  Add 2 additional days to program exercise sessions.  Add 2 additional days to program exercise sessions.   Initial Home Exercises Provided  --  --  10/23/19  10/23/19  10/23/19   Row Name 11/27/19 1500 12/14/19 0800           Response to Exercise   Blood Pressure (Admit)  100/58  126/64      Blood Pressure (Exercise)  134/74  136/74      Blood Pressure (Exit)  92/60  112/58      Heart Rate (Admit)  85 bpm  64 bpm      Heart Rate (Exercise)  110 bpm  107 bpm      Heart Rate (Exit)  98 bpm  85 bpm      Oxygen Saturation (Admit)  95 %  97 %      Oxygen Saturation (Exercise)  93 %  95 %      Oxygen Saturation (Exit)  95 %  95 %       Rating of Perceived Exertion (Exercise)  15  14      Perceived Dyspnea (Exercise)  3  3      Symptoms  none  none      Duration  Continue with 30 min of aerobic exercise without signs/symptoms of physical distress.  Continue with 30 min of aerobic exercise without signs/symptoms of physical distress.      Intensity  THRR unchanged  THRR unchanged        Progression   Progression  Continue to progress workloads to maintain intensity without signs/symptoms of physical distress.  Continue to progress workloads to maintain intensity without signs/symptoms of physical distress.      Average METs  3.65  3.6        Resistance Training   Training Prescription  Yes  Yes  Weight   5 lb  6 lb      Reps  10-15  10-15        Interval Training   Interval Training  No  No        Treadmill   MPH  2.5  2.6      Grade  0  1      Minutes  15  15      METs  2.91  3.35        NuStep   Level  7  7      SPM  --  80      Minutes  15  15      METs  5.3  3.8        T5 Nustep   Level  5  --      Minutes  15  --      METs  2.4  --        Biostep-RELP   Level  7  --      Minutes  15  --      METs  4  --        Home Exercise Plan   Plans to continue exercise at  [employer] (comment) walking, gym, [employer] (comment) walking, gym, [employer]      Frequency  Add 2 additional days to program exercise sessions.  Add 2 additional days to program exercise sessions.      Initial Home Exercises Provided  10/23/19  10/23/19         Exercise Comments: Exercise Comments    Row Name 10/15/19 0759           Exercise Comments  First full day of exercise!  Patient was oriented to gym and equipment including functions, settings, policies, and procedures.  Patient's individual exercise prescription and treatment plan were reviewed.  All starting workloads were established based on the results of the 6 minute walk test done at initial orientation visit.  The plan for exercise  progression was also introduced and progression will be customized based on patient's performance and goals.          Exercise Goals and Review: Exercise Goals    Row Name 10/11/19 1241             Exercise Goals   Increase Physical Activity  Yes       Intervention  Provide advice, education, support and counseling about physical activity/exercise needs.;Develop an individualized exercise prescription for aerobic and resistive training based on initial evaluation findings, risk stratification, comorbidities and participant's personal goals.       Expected Outcomes  Short Term: Attend rehab on a regular basis to increase amount of physical activity.;Long Term: Add in home exercise to make exercise part of routine and to increase amount of physical activity.;Long Term: Exercising regularly at least 3-5 days a week.       Increase Strength and Stamina  Yes       Intervention  Provide advice, education, support and counseling about physical activity/exercise needs.;Develop an individualized exercise prescription for aerobic and resistive training based on initial evaluation findings, risk stratification, comorbidities and participant's personal goals.       Expected Outcomes  Short Term: Increase workloads from initial exercise prescription for resistance, speed, and METs.;Short Term: Perform resistance training exercises routinely during rehab and add in resistance training at home;Long Term: Improve cardiorespiratory fitness, muscular endurance and strength as measured by increased METs  and functional capacity (6MWT)       Able to understand and use rate of perceived exertion (RPE) scale  Yes       Intervention  Provide education and explanation on how to use RPE scale       Expected Outcomes  Short Term: Able to use RPE daily in rehab to express subjective intensity level;Long Term:  Able to use RPE to guide intensity level when exercising independently       Able to understand and use Dyspnea  scale  Yes       Intervention  Provide education and explanation on how to use Dyspnea scale       Expected Outcomes  Short Term: Able to use Dyspnea scale daily in rehab to express subjective sense of shortness of breath during exertion;Long Term: Able to use Dyspnea scale to guide intensity level when exercising independently       Knowledge and understanding of Target Heart Rate Range (THRR)  Yes       Intervention  Provide education and explanation of THRR including how the numbers were predicted and where they are located for reference       Expected Outcomes  Short Term: Able to state/look up THRR;Long Term: Able to use THRR to govern intensity when exercising independently;Short Term: Able to use daily as guideline for intensity in rehab       Able to check pulse independently  Yes       Intervention  Provide education and demonstration on how to check pulse in carotid and radial arteries.;Review the importance of being able to check your own pulse for safety during independent exercise       Expected Outcomes  Short Term: Able to explain why pulse checking is important during independent exercise;Long Term: Able to check pulse independently and accurately       Understanding of Exercise Prescription  Yes       Intervention  Provide education, explanation, and written materials on patient's individual exercise prescription       Expected Outcomes  Short Term: Able to explain program exercise prescription;Long Term: Able to explain home exercise prescription to exercise independently          Exercise Goals Re-Evaluation : Exercise Goals Re-Evaluation    Row Name 10/15/19 0815 10/23/19 0804 10/29/19 0809 10/31/19 1415 11/15/19 0857     Exercise Goal Re-Evaluation   Exercise Goals Review  Able to understand and use Dyspnea scale;Able to understand and use rate of perceived exertion (RPE) scale;Knowledge and understanding of Target Heart Rate Range (THRR);Understanding of Exercise  Prescription  Able to understand and use Dyspnea scale;Able to understand and use rate of perceived exertion (RPE) scale;Knowledge and understanding of Target Heart Rate Range (THRR);Understanding of Exercise Prescription;Increase Physical Activity;Increase Strength and Stamina;Able to check pulse independently  Increase Physical Activity;Increase Strength and Stamina;Understanding of Exercise Prescription  Increase Physical Activity;Increase Strength and Stamina;Understanding of Exercise Prescription  Increase Physical Activity;Increase Strength and Stamina;Able to understand and use rate of perceived exertion (RPE) scale;Able to understand and use Dyspnea scale;Knowledge and understanding of Target Heart Rate Range (THRR);Able to check pulse independently;Understanding of Exercise Prescription   Comments  Reviewed RPE scale, THR and program prescription with pt today.  Pt voiced understanding and was given a copy of goals to take home.  Reviewed home exercise with pt today.  Pt plans to walk, go to [facility], and use videos for exercise.  She is planning to get a pulse oximeter from  [place]. Reviewed THR, pulse, RPE, sign and symptoms, and when to call 911 or MD.  Also discussed weather considerations and indoor options.  Pt voiced understanding.  Mindie is off to good start.  She is already getting in her weights at house and going to gym at apartment complex.  She has gotten her pulse oximeter to monitor herself on treadmill at home.  Brookelyn has been doing well in rehab. She is up to level 4 on the BioStep.  We will continue to monitor her progress.  Arnett has movd up to level 6 on NS and Biostep.  She is up to 5 lb for strength work.   Expected Outcomes  Short: Use RPE daily to regulate intensity. Long: Follow program prescription in THR.  Short: Start to add in exercise at home.  Long: Continue to improve stamina.  Short: Continue to exercise on off days.  Long; Continue to attend regularly.  Short: Increase  workload on NuStep.  Long: Continue to exercise on off days.  Short : continue to exercise consistently Long:  increase MET level   Row Name 11/27/19 1542 12/10/19 0743 12/14/19 0816         Exercise Goal Re-Evaluation   Exercise Goals Review  Increase Physical Activity;Increase Strength and Stamina;Understanding of Exercise Prescription  Increase Physical Activity;Increase Strength and Stamina;Understanding of Exercise Prescription  Increase Physical Activity;Increase Strength and Stamina;Able to understand and use rate of perceived exertion (RPE) scale;Able to understand and use Dyspnea scale;Knowledge and understanding of Target Heart Rate Range (THRR);Able to check pulse independently;Understanding of Exercise Prescription     Comments  Welma has been doing well in rehab. She was out on vacation but came extra days to make up her time. She was also planning to walk while she was gone.  She is now on level 7 for both NuStep and BioStep.  We will continue to monitor her progress.  Nakeshia is doing well in rehab.  She admits that she could do better with her home exercise.  Her knee has been bothering her at home and she won't go walking.  She is supposed to go back to doctor to set up surgery.  She normally goes to apartment gym to walk on treadmill, but has been consistent with her weights.  We talked about adding in the staff videos for the chair exercises for her knee days.  Terrionna works at [employer] and has increased to 6 lb weights.  Staff will monitor progress.     Expected Outcomes  Short: Continue to improve workloads. Long: Continue to improve stamina.  Short: Add in chair exercises  Long: Continue to improve strength for surgery.  Short:  continue consistent exercise Long:  build overall stamina        Discharge Exercise Prescription (Final Exercise Prescription Changes): Exercise Prescription Changes - 12/14/19 0800      Response to Exercise   Blood Pressure (Admit)  126/64    Blood  Pressure (Exercise)  136/74    Blood Pressure (Exit)  112/58    Heart Rate (Admit)  64 bpm    Heart Rate (Exercise)  107 bpm    Heart Rate (Exit)  85 bpm    Oxygen Saturation (Admit)  97 %    Oxygen Saturation (Exercise)  95 %    Oxygen Saturation (Exit)  95 %    Rating of Perceived Exertion (Exercise)  14    Perceived Dyspnea (Exercise)  3    Symptoms  none  Duration  Continue with 30 min of aerobic exercise without signs/symptoms of physical distress.    Intensity  THRR unchanged      Progression   Progression  Continue to progress workloads to maintain intensity without signs/symptoms of physical distress.    Average METs  3.6      Resistance Training   Training Prescription  Yes    Weight  6 lb    Reps  10-15      Interval Training   Interval Training  No      Treadmill   MPH  2.6    Grade  1    Minutes  15    METs  3.35      NuStep   Level  7    SPM  80    Minutes  15    METs  3.8      Home Exercise Plan   Plans to continue exercise at  [employer] (comment)   walking, gym, [employer]   Frequency  Add 2 additional days to program exercise sessions.    Initial Home Exercises Provided  10/23/19       Nutrition:  Target Goals: Understanding of nutrition guidelines, daily intake of sodium '1500mg'$ , cholesterol '200mg'$ , calories 30% from fat and 7% or less from saturated fats, daily to have 5 or more servings of fruits and vegetables.  Education: Controlling Sodium/Reading Food Labels -Group verbal and written material supporting the discussion of sodium use in heart healthy nutrition. Review and explanation with models, verbal and written materials for utilization of the food label.   Education: General Nutrition Guidelines/Fats and Fiber: -Group instruction provided by verbal, written material, models and posters to present the general guidelines for heart healthy nutrition. Gives an explanation and review of dietary fats and fiber.   Biometrics: Pre  Biometrics - 10/11/19 1241      Pre Biometrics   Height  5' 6.75" (1.695 m)    Weight  273 lb 14.4 oz (124.2 kg)    BMI (Calculated)  43.24    Single Leg Stand  6.76 seconds        Nutrition Therapy Plan and Nutrition Goals: Nutrition Therapy & Goals - 11/08/19 1009      Nutrition Therapy   Diet  HH, Low Na    Protein (specify units)  100-105g    Fiber  25 grams    Whole Grain Foods  3 servings    Saturated Fats  12 max. grams    Fruits and Vegetables  5 servings/day    Sodium  1.5 grams      Personal Nutrition Goals   Nutrition Goal  ST: repleace honey wheat with 100% wheat LT: eating better, weight loss    Comments  B instant grits, egg (scrambled) with bacon, peanut butter with natures own honey wheat and skippy's honey. tea - diet tea or coffee - cream, stevia. L: sandwich (pb or cheese or ham) D: salad (chicken caesar salad packet) with walnuts, bacon bits, extra cheese, seasame sticks. S: a few ritz crackers or kettle corn (rice cakes). Drinks: diet tea. trying to add more water (will usually add crystal lite). Pt reports not feeling hungry. energy improved since starting rehab 6-7/10 (was 0/10). Discussed HH eating.      Intervention Plan   Intervention  Prescribe, educate and counsel regarding individualized specific dietary modifications aiming towards targeted core components such as weight, hypertension, lipid management, diabetes, heart failure and other comorbidities.;Nutrition handout(s) given to patient.  Expected Outcomes  Short Term Goal: Understand basic principles of dietary content, such as calories, fat, sodium, cholesterol and nutrients.;Short Term Goal: A plan has been developed with personal nutrition goals set during dietitian appointment.;Long Term Goal: Adherence to prescribed nutrition plan.       Nutrition Assessments: Nutrition Assessments - 10/11/19 1242      MEDFICTS Scores   Pre Score  29       MEDIFICTS Score Key:          ?70 Need to make  dietary changes          40-70 Heart Healthy Diet         ? 40 Therapeutic Level Cholesterol Diet  Nutrition Goals Re-Evaluation: Nutrition Goals Re-Evaluation    [place] Name 12/03/19 0815             Goals   Nutrition Goal  ST: continue with current changes LT: eating better, weight loss       Comment  Pt changed over the 100% whole wheat and bought carrots and apples to have for snacks. Pt reports enjoying that on its own but when she wants more will add peanut butter. Discussed low/no salt and low/no sugar added peanut butter, pt showed understanding. Pt reports would like to continue with current changes.       Expected Outcome  ST: continue with current changes LT: eating better, weight loss          Nutrition Goals Discharge (Final Nutrition Goals Re-Evaluation): Nutrition Goals Re-Evaluation - 12/03/19 0815      Goals   Nutrition Goal  ST: continue with current changes LT: eating better, weight loss    Comment  Pt changed over the 100% whole wheat and bought carrots and apples to have for snacks. Pt reports enjoying that on its own but when she wants more will add peanut butter. Discussed low/no salt and low/no sugar added peanut butter, pt showed understanding. Pt reports would like to continue with current changes.    Expected Outcome  ST: continue with current changes LT: eating better, weight loss       Psychosocial: Target Goals: Acknowledge presence or absence of significant depression and/or stress, maximize coping skills, provide positive support system. Participant is able to verbalize types and ability to use techniques and skills needed for reducing stress and depression.   Education: Depression - Provides group verbal and written instruction on the correlation between heart/lung disease and depressed mood, treatment options, and the stigmas associated with seeking treatment.   Education: Sleep Hygiene -Provides group verbal and written instruction about how sleep  can affect your health.  Define sleep hygiene, discuss sleep cycles and impact of sleep habits. Review good sleep hygiene tips.    Education: Stress and Anxiety: - Provides group verbal and written instruction about the health risks of elevated stress and causes of high stress.  Discuss the correlation between heart/lung disease and anxiety and treatment options. Review healthy ways to manage with stress and anxiety.   Initial Review & Psychosocial Screening: Initial Psych Review & Screening - 10/10/19 1420      Initial Review   Current issues with  Current Stress Concerns;History of Depression;Current Depression    Source of Stress Concerns  Chronic Illness;Financial    Comments  long history of pulm disease, finances are stressful and in a day to day mangament phase, not currently having symptoms      Family Dynamics   Good Support System?  No  friends (Bethune and Lillie) able to call when needed   Concerns  Inappropriate over/under dependence on family/friends      Barriers   Psychosocial barriers to participate in program  The patient should benefit from training in stress management and relaxation.;Psychosocial barriers identified (see note)      Screening Interventions   Interventions  Provide feedback about the scores to participant;To provide support and resources with identified psychosocial needs;Encouraged to exercise    Expected Outcomes  Short Term goal: Utilizing psychosocial counselor, staff and physician to assist with identification of specific Stressors or current issues interfering with healing process. Setting desired goal for each stressor or current issue identified.;Long Term Goal: Stressors or current issues are controlled or eliminated.;Short Term goal: Identification and review with participant of any Quality of Life or Depression concerns found by scoring the questionnaire.;Long Term goal: The participant improves quality of Life and PHQ9 Scores as seen by post  scores and/or verbalization of changes       Quality of Life Scores:  Scores of 19 and below usually indicate a poorer quality of life in these areas.  A difference of  2-3 points is a clinically meaningful difference.  A difference of 2-3 points in the total score of the Quality of Life Index has been associated with significant improvement in overall quality of life, self-image, physical symptoms, and general health in studies assessing change in quality of life.  PHQ-9: Recent Review Flowsheet Data    Depression screen [facility] 2/9 12/10/2019 10/29/2019 10/11/2019 05/21/2019   Decreased Interest 2 0 1 0   Down, Depressed, Hopeless 3 0 1 0   PHQ - 2 Score 5 0 2 0   Altered sleeping 2 0 3 0   Tired, decreased energy 2 0 3 0   Change in appetite 0 0 3 0   Feeling bad or failure about yourself  1 0 1 0   Trouble concentrating 1 0 1 0   Moving slowly or fidgety/restless 0 0 0 0   Suicidal thoughts 0 0 0 0   PHQ-9 Score 11 0 13 0   Difficult doing work/chores Somewhat difficult Not difficult at all Somewhat difficult Not difficult at all     Interpretation of Total Score  Total Score Depression Severity:  1-4 = Minimal depression, 5-9 = Mild depression, 10-14 = Moderate depression, 15-19 = Moderately severe depression, 20-27 = Severe depression   Psychosocial Evaluation and Intervention: Psychosocial Evaluation - 10/10/19 1428      Psychosocial Evaluation & Interventions   Interventions  Stress management education;Encouraged to exercise with the program and follow exercise prescription    Comments  Jesilyn is coming into pulmonary rehab with hx of asthma and pulmonary hypertension.  She lives by herself but has friends in [place] that she can call on for support.  She has a history of depression with some good days and bad days, mostly good recently.  She is feeling pretty good overall.  She is looking forward to rehab and wants to get back to feeling better. She wants to be able to breathe  better and lose weight so that she can go walking again!!  She does have some financial strain being on her own and we talked about letting us know if it becomes a burden and she needs help.    Expected Outcomes  Short: Attend rehab to build stamina and work on weight loss  Long: Stay positive and get to walking again.  Psychosocial Re-Evaluation: Psychosocial Re-Evaluation    Row Name 10/29/19 [phone] 12/10/19 0748           Psychosocial Re-Evaluation   Current issues with  Current Stress Concerns;Current Sleep Concerns  Current Stress Concerns;Current Sleep Concerns      Comments  PHQ improved to 0!!  She is feeling better overall.  Her sleep is still disrupted by her coughing, but overall it is good.  She denies symptoms of depression and attributes her improvements to getting back to working out and coming to class!  Most days she is doing good but does have some depressive symptoms that are keeping her from getting out and going to exericse at home.  She has several test coming up this week and a planned surgery on her knee.  She said that she is not too worried about them, just need to get them done.      Expected Outcomes  Short: Continue to exercise for her mentally health.  Long: Continue to boost mental health.  Short: Get out to exericse for mental boost.  Long: Continue to fight depression and cope positively.      Interventions  Encouraged to attend Pulmonary Rehabilitation for the exercise  Encouraged to attend Pulmonary Rehabilitation for the exercise      Continue Psychosocial Services   Follow up required by staff  Follow up required by staff         Psychosocial Discharge (Final Psychosocial Re-Evaluation): Psychosocial Re-Evaluation - 12/10/19 0748      Psychosocial Re-Evaluation   Current issues with  Current Stress Concerns;Current Sleep Concerns    Comments  Most days she is doing good but does have some depressive symptoms that are keeping her from getting out and  going to exericse at home.  She has several test coming up this week and a planned surgery on her knee.  She said that she is not too worried about them, just need to get them done.    Expected Outcomes  Short: Get out to exericse for mental boost.  Long: Continue to fight depression and cope positively.    Interventions  Encouraged to attend Pulmonary Rehabilitation for the exercise    Continue Psychosocial Services   Follow up required by staff       Education: Education Goals: Education classes will be provided on a weekly basis, covering required topics. Participant will state understanding/return demonstration of topics presented.  Learning Barriers/Preferences:   General Pulmonary Education Topics:  Infection Prevention: - Provides verbal and written material to individual with discussion of infection control including proper hand washing and proper equipment cleaning during exercise session.   Pulmonary Rehab from 10/11/2019 in [facility] Cardiac [facility] Pulmonary Rehab  Date  10/11/19  Educator  [facility]  Instruction Review Code  1- Verbalizes Understanding      Falls Prevention: - Provides verbal and written material to individual with discussion of falls prevention and safety.   Pulmonary Rehab from 10/11/2019 in [facility] Cardiac and Pulmonary Rehab  Date  10/11/19  Educator  [facility]  Instruction Review Code  1- Verbalizes Understanding      Chronic Lung Diseases: - Group verbal and written instruction to review updates, respiratory medications, advancements in procedures and treatments. Discuss use of supplemental oxygen including available portable oxygen systems, continuous and intermittent flow rates, concentrators, personal use and safety guidelines. Review proper use of inhaler and spacers. Provide informative websites for self-education.    Energy Conservation: - Provide group verbal and written instruction for  methods to conserve energy, plan and organize activities. Instruct on pacing  techniques, use of adaptive equipment and posture/positioning to relieve shortness of breath.   Triggers and Exacerbations: - Group verbal and written instruction to review types of environmental triggers and ways to prevent exacerbations. Discuss weather changes, air quality and the benefits of nasal washing. Review warning signs and symptoms to help prevent infections. Discuss techniques for effective airway clearance, coughing, and vibrations.   AED/CPR: - Group verbal and written instruction with the use of models to demonstrate the basic use of the AED with the basic ABC's of resuscitation.   Anatomy and Physiology of the Lungs: - Group verbal and written instruction with the use of models to provide basic lung anatomy and physiology related to function, structure and complications of lung disease.   Anatomy & Physiology of the Heart: - Group verbal and written instruction and models provide basic cardiac anatomy and physiology, with the coronary electrical and arterial systems. Review of Valvular disease and Heart Failure   Cardiac Medications: - Group verbal and written instruction to review commonly prescribed medications for heart disease. Reviews the medication, class of the drug, and side effects.   Other: -Provides group and verbal instruction on various topics (see comments)   Knowledge Questionnaire Score: Knowledge Questionnaire Score - 10/11/19 1242      Knowledge Questionnaire Score   Pre Score  17/18 Education: O2 saturations        Core Components/Risk Factors/Patient Goals at Admission: Personal Goals and Risk Factors at Admission - 10/11/19 1242      Core Components/Risk Factors/Patient Goals on Admission    Weight Management  Yes;Obesity;Weight Loss    Intervention  Weight Management: Develop a combined nutrition and exercise program designed to reach desired caloric intake, while maintaining appropriate intake of nutrient and fiber, sodium and fats, and  appropriate energy expenditure required for the weight goal.;Weight Management/Obesity: Establish reasonable short term and long term weight goals.;Weight Management: Provide education and appropriate resources to help participant work on and attain dietary goals.;Obesity: Provide education and appropriate resources to help participant work on and attain dietary goals.    Admit Weight  273 lb 14.4 oz (124.2 kg)    Goal Weight: Short Term  268 lb (121.6 kg)    Goal Weight: Long Term  260 lb (117.9 kg)    Expected Outcomes  Short Term: Continue to assess and modify interventions until short term weight is achieved;Long Term: Adherence to nutrition and physical activity/exercise program aimed toward attainment of established weight goal;Weight Loss: Understanding of general recommendations for a balanced deficit meal plan, which promotes 1-2 lb weight loss per week and includes a negative energy balance of [phone] kcal/d;Understanding recommendations for meals to include 15-35% energy as protein, 25-35% energy from fat, 35-60% energy from carbohydrates, less than '200mg'$  of dietary cholesterol, 20-35 gm of total fiber daily;Understanding of distribution of calorie intake throughout the day with the consumption of 4-5 meals/snacks    Improve shortness of breath with ADL's  Yes    Intervention  Provide education, individualized exercise plan and daily activity instruction to help decrease symptoms of SOB with activities of daily living.    Expected Outcomes  Short Term: Improve cardiorespiratory fitness to achieve a reduction of symptoms when performing ADLs;Long Term: Be able to perform more ADLs without symptoms or delay the onset of symptoms    Hypertension  Yes    Intervention  Provide education on lifestyle modifcations including regular physical activity/exercise, weight management,  moderate sodium restriction and increased consumption of fresh fruit, vegetables, and low fat dairy, alcohol moderation, and  smoking cessation.;Monitor prescription use compliance.    Expected Outcomes  Long Term: Maintenance of blood pressure at goal levels.;Short Term: Continued assessment and intervention until BP is < 140/18m HG in hypertensive participants. < 130/827mHG in hypertensive participants with diabetes, heart failure or chronic kidney disease.    Lipids  Yes    Intervention  Provide education and support for participant on nutrition & aerobic/resistive exercise along with prescribed medications to achieve LDL '70mg'$ , HDL >'40mg'$ .    Expected Outcomes  Short Term: Participant states understanding of desired cholesterol values and is compliant with medications prescribed. Participant is following exercise prescription and nutrition guidelines.;Long Term: Cholesterol controlled with medications as prescribed, with individualized exercise RX and with personalized nutrition plan. Value goals: LDL < '70mg'$ , HDL > 40 mg.       Education:Diabetes - Individual verbal and written instruction to review signs/symptoms of diabetes, desired ranges of glucose level fasting, after meals and with exercise. Acknowledge that pre and post exercise glucose checks will be done for 3 sessions at entry of program.   Education: Know Your Numbers and Risk Factors: -Group verbal and written instruction about important numbers in your health.  Discussion of what are risk factors and how they play a role in the disease process.  Review of Cholesterol, Blood Pressure, Diabetes, and BMI and the role they play in your overall health.   Core Components/Risk Factors/Patient Goals Review:  Goals and Risk Factor Review    Row Name 10/29/19 0810 12/10/19 0750           Core Components/Risk Factors/Patient Goals Review   Personal Goals Review  Weight Management/Obesity;Improve shortness of breath with ADL's;Hypertension;Lipids  Weight Management/Obesity;Improve shortness of breath with ADL's;Hypertension;Lipids      Review  ChGelseys  already starting to lose weight and feeling good despite not eating as well while out of town.  Blood pressures have been good and she usually checks them at home.  Meds are good.  SOB in morning but it does improve as day goes by.  ChAlmers doing well overall.  Her weight is holding steady and not moving as she is not getting in her exercise in on off days.  Her pressures have been running low with low fluid levels. She has been a little lightheaded and slightly dehydrated and we talked about getting in enough water.  Her breathing has gotten worse the last couple of days and needed to do a breathing treatment to help.      Expected Outcomes  Short: Continue to work SOB and weight loss.  Long: Continue to monitor risk factors.  Short: Drink more water.  Long: Continue to work on weight loss.         Core Components/Risk Factors/Patient Goals at Discharge (Final Review):  Goals and Risk Factor Review - 12/10/19 0750      Core Components/Risk Factors/Patient Goals Review   Personal Goals Review  Weight Management/Obesity;Improve shortness of breath with ADL's;Hypertension;Lipids    Review  ChTunyas doing well overall.  Her weight is holding steady and not moving as she is not getting in her exercise in on off days.  Her pressures have been running low with low fluid levels. She has been a little lightheaded and slightly dehydrated and we talked about getting in enough water.  Her breathing has gotten worse the last couple of days and needed to  do a breathing treatment to help.    Expected Outcomes  Short: Drink more water.  Long: Continue to work on weight loss.       ITP Comments: ITP Comments    Row Name 10/10/19 1439 10/11/19 1229 10/15/19 0759 10/24/19 0632 11/08/19 1135   ITP Comments  Completed virtual orientation today.  EP eval scheduled for 2/11 at 11am.  Documentation for diagnosis can be found in CE encounter 10/03/19.  Completed 6MWT and gym orientation.  Initial ITP created and sent for  review to Dr. Emily Filbert, Medical Director.  First full day of exercise!  Patient was oriented to gym and equipment including functions, settings, policies, and procedures.  Patient's individual exercise prescription and treatment plan were reviewed.  All starting workloads were established based on the results of the 6 minute walk test done at initial orientation visit.  The plan for exercise progression was also introduced and progression will be customized based on patient's performance and goals.  30 day chart review completed. ITP sent to Dr Zachery Dakins Medical Director, for review,changes as needed and signature.  Completed Initial RD Eval   Row Name 11/21/19 0635 12/19/19 0550         ITP Comments  30 day chart review completed. ITP sent to Dr Zachery Dakins Medical Director, for review,changes as needed and signature. Continue with ITP if no changes requested  30 Day review completed. Medical Director review done, changes made as directed,and approval shown by signature of Market researcher.         Comments:

## 2019-12-19 NOTE — Progress Notes (Signed)
Daily Session Note  Patient Details  Name: Sharon Cervantes MRN: [id number] Date of Birth: 1951/07/02 Referring Provider:     Pulmonary Rehab from 10/11/2019 in [facility] Cardiac and Pulmonary Rehab  Referring Provider  Claudette Stapler MD      Encounter Date: 12/19/2019  Check In: Session Check In - 12/19/19 0816      Check-In   Supervising physician immediately available to respond to emergencies  See telemetry face sheet for immediately available ER MD    Location  [facility]-Cardiac & Pulmonary Rehab    Staff Present  Heath Lark, RN, BSN, CCRP;Amanda Sommer, BA, ACSM CEP, Exercise Physiologist;Jessica Haven, MA, RCEP, CCRP, CCET;Joseph [employer] Visit  No    Medication changes reported      No    Fall or balance concerns reported     No    Warm-up and Cool-down  Performed on first and last piece of equipment    Resistance Training Performed  Yes    VAD Patient?  No    PAD/SET Patient?  No      Pain Assessment   Currently in Pain?  No/denies          Social History   Tobacco Use  Smoking Status Never Smoker  Smokeless Tobacco Never Used    Goals Met:  Proper associated with RPD/PD & O2 Sat Independence with exercise equipment Exercise tolerated well No report of cardiac concerns or symptoms  Goals Unmet:  Not Applicable  Comments: Pt able to follow exercise prescription today without complaint.  Will continue to monitor for progression.    Dr. Emily Filbert is Medical Director for [place] and [name].

## 2019-12-21 ENCOUNTER — Other Ambulatory Visit: Payer: Self-pay

## 2019-12-21 ENCOUNTER — Encounter: Payer: Medicare Other | Admitting: *Deleted

## 2019-12-21 DIAGNOSIS — J4550 Severe persistent asthma, uncomplicated: Secondary | ICD-10-CM

## 2019-12-21 DIAGNOSIS — J45909 Unspecified asthma, uncomplicated: Secondary | ICD-10-CM | POA: Diagnosis not present

## 2019-12-21 DIAGNOSIS — I272 Pulmonary hypertension, unspecified: Secondary | ICD-10-CM

## 2019-12-21 NOTE — Progress Notes (Signed)
Daily Session Note  Patient Details  Name: Sharon Cervantes MRN: [id number] Date of Birth: May 19, 1951 Referring Provider:     Pulmonary Rehab from 10/11/2019 in [facility] Cardiac and Pulmonary Rehab  Referring Provider  Claudette Stapler MD      Encounter Date: 12/21/2019  Check In: Session Check In - 12/21/19 0750      Check-In   Supervising physician immediately available to respond to emergencies  See telemetry face sheet for immediately available ER MD    Location  [facility]-Cardiac & Pulmonary Rehab    Staff Present  Justin Mend RCP,RRT,BSRT;Jessica Luan [place], MA, RCEP, CCRP, CCET    Virtual Visit  No    Medication changes reported      No    Fall or balance concerns reported     No    Warm-up and Cool-down  Performed on first and last piece of equipment    Resistance Training Performed  Yes    VAD Patient?  No    PAD/SET Patient?  No      Pain Assessment   Currently in Pain?  No/denies          Social History   Tobacco Use  Smoking Status Never Smoker  Smokeless Tobacco Never Used    Goals Met:  Independence with exercise equipment Exercise tolerated well No report of cardiac concerns or symptoms  Goals Unmet:  Not Applicable  Comments: Pt able to follow exercise prescription today without complaint.  Will continue to monitor for progression.   Dr. Emily Filbert is Medical Director for [place] and [name].

## 2019-12-24 ENCOUNTER — Other Ambulatory Visit: Payer: Self-pay

## 2019-12-24 ENCOUNTER — Encounter: Payer: Medicare Other | Admitting: *Deleted

## 2019-12-24 DIAGNOSIS — I272 Pulmonary hypertension, unspecified: Secondary | ICD-10-CM

## 2019-12-24 DIAGNOSIS — J45909 Unspecified asthma, uncomplicated: Secondary | ICD-10-CM | POA: Diagnosis not present

## 2019-12-24 DIAGNOSIS — J4550 Severe persistent asthma, uncomplicated: Secondary | ICD-10-CM

## 2019-12-24 NOTE — Progress Notes (Signed)
Daily Session Note  Patient Details  Name: Sharon Cervantes MRN: [id number] Date of Birth: [birth year] Referring Provider:     Pulmonary Rehab from 10/11/2019 in [facility] Cardiac and Pulmonary Rehab  Referring Provider  Claudette Stapler MD      Encounter Date: 12/24/2019  Check In: Session Check In - 12/24/19 0756      Check-In   Supervising physician immediately available to respond to emergencies  See telemetry face sheet for immediately available ER MD    Location  [facility]-Cardiac & Pulmonary Rehab    Staff Present  Heath Lark, RN, BSN, Laveda Norman, BS, ACSM CEP, Exercise Physiologist;Joseph Tessie Fass RCP,RRT,BSRT    Virtual Visit  No    Medication changes reported      No    Fall or balance concerns reported     No    Warm-up and Cool-down  Performed on first and last piece of equipment    Resistance Training Performed  Yes    VAD Patient?  No    PAD/SET Patient?  No      Pain Assessment   Currently in Pain?  No/denies          Social History   Tobacco Use  Smoking Status Never Smoker  Smokeless Tobacco Never Used    Goals Met:  Proper associated with RPD/PD & O2 Sat Independence with exercise equipment Exercise tolerated well No report of cardiac concerns or symptoms  Goals Unmet:  Not Applicable  Comments: Pt able to follow exercise prescription today without complaint.  Will continue to monitor for progression.    Dr. Emily Filbert is Medical Director for [place] and [name].

## 2019-12-28 ENCOUNTER — Other Ambulatory Visit: Payer: Self-pay

## 2019-12-28 DIAGNOSIS — J4550 Severe persistent asthma, uncomplicated: Secondary | ICD-10-CM

## 2019-12-28 DIAGNOSIS — I272 Pulmonary hypertension, unspecified: Secondary | ICD-10-CM

## 2019-12-28 DIAGNOSIS — J45909 Unspecified asthma, uncomplicated: Secondary | ICD-10-CM | POA: Diagnosis not present

## 2019-12-28 NOTE — Progress Notes (Signed)
Daily Session Note  Patient Details  Name: Sharon Cervantes MRN: [id number] Date of Birth: 01/22/1951 Referring Provider:     Pulmonary Rehab from 10/11/2019 in [facility] Cardiac and Pulmonary Rehab  Referring Provider  Aleskerov, Fred MD      Encounter Date: 12/28/2019  Check In: Session Check In - 12/28/19 0800      Check-In   Supervising physician immediately available to respond to emergencies  See telemetry face sheet for immediately available ER MD    Location  [facility]-Cardiac & Pulmonary Rehab    Staff Present  Leslie Castrejon RN, BSN;Jessica Hawkins, MA, RCEP, CCRP, CCET;Joseph Hood RCP,RRT,BSRT    Virtual Visit  No    Medication changes reported      Yes    Comments  MD took pt off BP/fluid (Olmesartan/Hydroclorothiazide) pill combo and now pt is just on fluid pill(Hydroclorothiazide).    Fall or balance concerns reported     No    Warm-up and Cool-down  Performed on first and last piece of equipment    Resistance Training Performed  Yes    VAD Patient?  No    PAD/SET Patient?  No      Pain Assessment   Currently in Pain?  No/denies          Social History   Tobacco Use  Smoking Status Never Smoker  Smokeless Tobacco Never Used    Goals Met:  Proper associated with RPD/PD & O2 Sat Independence with exercise equipment Using PLB without cueing & demonstrates good technique Exercise tolerated well No report of cardiac concerns or symptoms Strength training completed today  Goals Unmet:  Not Applicable  Comments: Pt able to follow exercise prescription today without complaint.  Will continue to monitor for progression.   Dr. Mark Miller is Medical Director for [facility] and [name]. 

## 2019-12-31 ENCOUNTER — Encounter: Payer: Medicare Other | Attending: Pulmonary Disease | Admitting: *Deleted

## 2019-12-31 ENCOUNTER — Other Ambulatory Visit: Payer: Self-pay

## 2019-12-31 DIAGNOSIS — J45909 Unspecified asthma, uncomplicated: Secondary | ICD-10-CM | POA: Insufficient documentation

## 2019-12-31 DIAGNOSIS — I272 Pulmonary hypertension, unspecified: Secondary | ICD-10-CM | POA: Insufficient documentation

## 2019-12-31 DIAGNOSIS — J4550 Severe persistent asthma, uncomplicated: Secondary | ICD-10-CM

## 2019-12-31 NOTE — Progress Notes (Signed)
Daily Session Note  Patient Details  Name: Sharon Cervantes MRN: [id number] Date of Birth: 09/25/1950 Referring Provider:     Pulmonary Rehab from 10/11/2019 in [facility] Cardiac and Pulmonary Rehab  Referring Provider  Claudette Stapler MD      Encounter Date: 12/31/2019  Check In: Session Check In - 12/31/19 0824      Check-In   Supervising physician immediately available to respond to emergencies  See telemetry face sheet for immediately available ER MD    Location  [facility]-Cardiac & Pulmonary Rehab    Staff Present  Heath Lark, RN, BSN, CCRP;Joseph Hood RCP,RRT,BSRT;Kelly [place], [place], ACSM CEP, Exercise Physiologist    Virtual Visit  No    Medication changes reported      No    Fall or balance concerns reported     No    Warm-up and Cool-down  Performed on first and last piece of equipment    Resistance Training Performed  Yes    VAD Patient?  No    PAD/SET Patient?  No      Pain Assessment   Currently in Pain?  No/denies          Social History   Tobacco Use  Smoking Status Never Smoker  Smokeless Tobacco Never Used    Goals Met:  Proper associated with RPD/PD & O2 Sat Independence with exercise equipment Exercise tolerated well No report of cardiac concerns or symptoms  Goals Unmet:  Not Applicable  Comments: Pt able to follow exercise prescription today without complaint.  Will continue to monitor for progression.    Dr. Emily Filbert is Medical Director for [place] and [name].

## 2020-01-02 ENCOUNTER — Encounter: Payer: Medicare Other | Admitting: *Deleted

## 2020-01-02 ENCOUNTER — Other Ambulatory Visit: Payer: Self-pay

## 2020-01-02 DIAGNOSIS — I272 Pulmonary hypertension, unspecified: Secondary | ICD-10-CM

## 2020-01-02 DIAGNOSIS — J45909 Unspecified asthma, uncomplicated: Secondary | ICD-10-CM | POA: Diagnosis not present

## 2020-01-02 NOTE — Progress Notes (Signed)
Daily Session Note  Patient Details  Name: Sharon Cervantes MRN: [id number] Date of Birth: 03/20/1951 Referring Provider:     Pulmonary Rehab from 10/11/2019 in [facility] Cardiac and Pulmonary Rehab  Referring Provider  Aleskerov, Fred MD      Encounter Date: 01/02/2020  Check In: Session Check In - 01/02/20 0820      Check-In   Supervising physician immediately available to respond to emergencies  See telemetry face sheet for immediately available ER MD    Location  [facility]-Cardiac & Pulmonary Rehab    Staff Present  Jessica Hawkins, MA, RCEP, CCRP, CCET;Amanda Sommer, BA, ACSM CEP, Exercise Physiologist;Joseph Hood RCP,RRT,BSRT;Melissa Caiola RDN, LDN;Other   Colleen Bailey RN, BSN   Virtual Visit  No    Medication changes reported      No    Fall or balance concerns reported     No    Warm-up and Cool-down  Performed on first and last piece of equipment    Resistance Training Performed  Yes    VAD Patient?  No    PAD/SET Patient?  No      Pain Assessment   Currently in Pain?  No/denies          Social History   Tobacco Use  Smoking Status Never Smoker  Smokeless Tobacco Never Used    Goals Met:  Independence with exercise equipment Exercise tolerated well No report of cardiac concerns or symptoms  Goals Unmet:  Not Applicable  Comments: Pt able to follow exercise prescription today without complaint.  Will continue to monitor for progression.    Dr. Mark Miller is Medical Director for [facility] and [name]. 

## 2020-01-04 ENCOUNTER — Encounter: Payer: Medicare Other | Admitting: *Deleted

## 2020-01-04 ENCOUNTER — Other Ambulatory Visit: Payer: Self-pay

## 2020-01-04 DIAGNOSIS — J45909 Unspecified asthma, uncomplicated: Secondary | ICD-10-CM | POA: Diagnosis not present

## 2020-01-04 DIAGNOSIS — I272 Pulmonary hypertension, unspecified: Secondary | ICD-10-CM

## 2020-01-04 DIAGNOSIS — J4550 Severe persistent asthma, uncomplicated: Secondary | ICD-10-CM

## 2020-01-04 NOTE — Progress Notes (Signed)
Daily Session Note  Patient Details  Name: Sharon Cervantes MRN: [id number] Date of Birth: 1951/05/06 Referring Provider:     Pulmonary Rehab from 10/11/2019 in [facility] Cardiac and Pulmonary Rehab  Referring Provider  Claudette Stapler MD      Encounter Date: 01/04/2020  Check In: Session Check In - 01/04/20 5189      Check-In   Supervising physician immediately available to respond to emergencies  See telemetry face sheet for immediately available ER MD    Location  [facility]-Cardiac & Pulmonary Rehab    Staff Present  Heath Lark, RN, BSN, CCRP;Joseph Hood RCP,RRT,BSRT;Jessica [place], [place], [place], [place], CCET    Virtual Visit  No    Medication changes reported      No    Fall or balance concerns reported     No    Warm-up and Cool-down  Performed on first and last piece of equipment    Resistance Training Performed  Yes    VAD Patient?  No    PAD/SET Patient?  No      Pain Assessment   Currently in Pain?  No/denies          Social History   Tobacco Use  Smoking Status Never Smoker  Smokeless Tobacco Never Used    Goals Met:  Proper associated with RPD/PD & O2 Sat Independence with exercise equipment Exercise tolerated well No report of cardiac concerns or symptoms  Goals Unmet:  Not Applicable  Comments: Pt able to follow exercise prescription today without complaint.  Will continue to monitor for progression.    Dr. Emily Filbert is Medical Director for [place] and [name].

## 2020-01-07 ENCOUNTER — Other Ambulatory Visit: Payer: Self-pay

## 2020-01-07 ENCOUNTER — Encounter: Payer: Medicare Other | Admitting: *Deleted

## 2020-01-07 DIAGNOSIS — J45909 Unspecified asthma, uncomplicated: Secondary | ICD-10-CM | POA: Diagnosis not present

## 2020-01-07 DIAGNOSIS — I272 Pulmonary hypertension, unspecified: Secondary | ICD-10-CM

## 2020-01-07 DIAGNOSIS — J4550 Severe persistent asthma, uncomplicated: Secondary | ICD-10-CM

## 2020-01-07 NOTE — Progress Notes (Signed)
Daily Session Note  Patient Details  Name: Sharon Cervantes MRN: [id number] Date of Birth: 1951/02/19 Referring Provider:     Pulmonary Rehab from 10/11/2019 in [facility] Cardiac and Pulmonary Rehab  Referring Provider  Claudette Stapler MD      Encounter Date: 01/07/2020  Check In: Session Check In - 01/07/20 0749      Check-In   Supervising physician immediately available to respond to emergencies  See telemetry face sheet for immediately available ER MD    Location  [facility]-Cardiac & Pulmonary Rehab    Staff Present  Heath Lark, RN, BSN, CCRP;Joseph Hood RCP,RRT,BSRT;Kelly [place], [place], ACSM CEP, Exercise Physiologist    Virtual Visit  No    Medication changes reported      No    Fall or balance concerns reported     No    Warm-up and Cool-down  Performed on first and last piece of equipment    Resistance Training Performed  Yes    VAD Patient?  No    PAD/SET Patient?  No      Pain Assessment   Currently in Pain?  No/denies          Social History   Tobacco Use  Smoking Status Never Smoker  Smokeless Tobacco Never Used    Goals Met:  Proper associated with RPD/PD & O2 Sat Independence with exercise equipment Exercise tolerated well No report of cardiac concerns or symptoms  Goals Unmet:  Not Applicable  Comments: Pt able to follow exercise prescription today without complaint.  Will continue to monitor for progression.    Dr. Emily Filbert is Medical Director for [place] and [name].

## 2020-01-09 ENCOUNTER — Encounter: Payer: Medicare Other | Admitting: *Deleted

## 2020-01-09 ENCOUNTER — Other Ambulatory Visit: Payer: Self-pay

## 2020-01-09 VITALS — Ht 66.75 in | Wt 256.4 lb

## 2020-01-09 DIAGNOSIS — J4550 Severe persistent asthma, uncomplicated: Secondary | ICD-10-CM

## 2020-01-09 DIAGNOSIS — I272 Pulmonary hypertension, unspecified: Secondary | ICD-10-CM

## 2020-01-09 DIAGNOSIS — J45909 Unspecified asthma, uncomplicated: Secondary | ICD-10-CM | POA: Diagnosis not present

## 2020-01-09 NOTE — Progress Notes (Signed)
Daily Session Note  Patient Details  Name: Sharon Cervantes MRN: [id number] Date of Birth: 1951/01/27 Referring Provider:     Pulmonary Rehab from 10/11/2019 in [facility] Cardiac and Pulmonary Rehab  Referring Provider  Claudette Stapler MD      Encounter Date: 01/09/2020  Check In: Session Check In - 01/09/20 8119      Check-In   Supervising physician immediately available to respond to emergencies  See telemetry face sheet for immediately available ER MD    Location  [facility]-Cardiac & Pulmonary Rehab    Staff Present  Heath Lark, RN, BSN, CCRP;Jessica [place], MA, RCEP, CCRP, [place], [place], ACSM CEP, Exercise Physiologist;Joseph [place] [employer]    Virtual Visit  No    Medication changes reported      No    Fall or balance concerns reported     No    Warm-up and Cool-down  Performed on first and last piece of equipment    Resistance Training Performed  Yes    VAD Patient?  No    PAD/SET Patient?  No      Pain Assessment   Currently in Pain?  No/denies          Social History   Tobacco Use  Smoking Status Never Smoker  Smokeless Tobacco Never Used    Goals Met:  Proper associated with RPD/PD & O2 Sat Independence with exercise equipment Exercise tolerated well No report of cardiac concerns or symptoms  Goals Unmet:  Not Applicable  Comments: Pt able to follow exercise prescription today without complaint.  Will continue to monitor for progression.  [place] Name 10/11/19 1237 01/09/20 0827       6 Minute Walk   Phase  Initial  Discharge    Distance  910 feet  1500 feet    Walk Time  6 minutes  6 minutes    # of Rest Breaks  0  0    MPH  1.72  2.84    METS  1.73  3.06    RPE  12  12    Perceived Dyspnea   2  2    VO2 Peak  6.06  10.7    Symptoms  Yes (comment)  Yes (comment)    Comments  SOB  SOB    Resting HR  89 bpm  74 bpm    Resting BP  124/74  136/74    Resting Oxygen Saturation   97 %  96 %    Exercise Oxygen Saturation  during 6  min walk  93 %  93 %    Max Ex. HR  122 bpm  120 bpm    Max Ex. BP  148/76  166/74    2 Minute Post BP  130/72  136/70      Interval HR   1 Minute HR  107  --    2 Minute HR  113  88    3 Minute HR  119  --    4 Minute HR  121  --    5 Minute HR  122  --    6 Minute HR  117  120    2 Minute Post HR  97  91    Interval Heart Rate?  Yes  Yes pulse oximeter did not register pulse consistently      Interval Oxygen   Interval Oxygen?  Yes  Yes    Baseline Oxygen Saturation %  97 %  96 %    1 Minute Oxygen Saturation %  98 %  96 % Room Air    1 Minute Liters of Oxygen  0 L Room Air  0 L    2 Minute Oxygen Saturation %  94 %  93 %    2 Minute Liters of Oxygen  0 L  0 L    3 Minute Oxygen Saturation %  93 %  94 %    3 Minute Liters of Oxygen  0 L  0 L    4 Minute Oxygen Saturation %  94 %  94 %    4 Minute Liters of Oxygen  0 L  0 L    5 Minute Oxygen Saturation %  93 %  94 %    5 Minute Liters of Oxygen  0 L  0 L    6 Minute Oxygen Saturation %  94 %  93 %    6 Minute Liters of Oxygen  0 L  0 L    2 Minute Post Oxygen Saturation %  99 %  98 %    2 Minute Post Liters of Oxygen  0 L  0 L         Dr. Emily Filbert is Medical Director for [place] and [name].

## 2020-01-16 ENCOUNTER — Encounter: Payer: Medicare Other | Admitting: *Deleted

## 2020-01-16 ENCOUNTER — Other Ambulatory Visit: Payer: Self-pay

## 2020-01-16 ENCOUNTER — Encounter: Payer: Self-pay | Admitting: *Deleted

## 2020-01-16 DIAGNOSIS — I272 Pulmonary hypertension, unspecified: Secondary | ICD-10-CM

## 2020-01-16 DIAGNOSIS — J4550 Severe persistent asthma, uncomplicated: Secondary | ICD-10-CM

## 2020-01-16 DIAGNOSIS — J45909 Unspecified asthma, uncomplicated: Secondary | ICD-10-CM | POA: Diagnosis not present

## 2020-01-16 NOTE — Progress Notes (Signed)
Pulmonary Individual Treatment Plan  Patient Details  Name: Sharon Cervantes MRN: [id number] Date of Birth: Apr 09, 1951 Referring Provider:     Pulmonary Rehab from 10/11/2019 in [facility] Cardiac and Pulmonary Rehab  Referring Provider  Claudette Stapler MD      Initial Encounter Date:    Pulmonary Rehab from 10/11/2019 in [facility] Cardiac and Pulmonary Rehab  Date  10/11/19      Visit Diagnosis: Pulmonary HTN ([place])  Severe persistent asthma, unspecified whether complicated  Patient's Home Medications on Admission:  Current Outpatient Medications:  .  albuterol (PROVENTIL HFA;VENTOLIN HFA) 108 (90 Base) MCG/ACT inhaler, Inhale into the lungs., Disp: , Rfl:  .  budesonide (PULMICORT) 0.5 MG/2ML nebulizer solution, Inhale into the lungs., Disp: , Rfl:  .  budesonide (PULMICORT) 0.5 MG/2ML nebulizer solution, Inhale into the lungs., Disp: , Rfl:  .  cyclobenzaprine (FLEXERIL) 10 MG tablet, Take 1 tablet (10 mg total) by mouth 2 (two) times daily as needed for muscle spasms., Disp: 20 tablet, Rfl: 0 .  Dupilumab (DUPIXENT) 300 MG/2ML SOPN, INJECT 2 PENS UNDER THE SKIN ON DAY 1., Disp: , Rfl:  .  fluticasone (FLONASE) 50 MCG/ACT nasal spray, Place into the nose., Disp: , Rfl:  .  fluticasone (FLOVENT HFA) 220 MCG/ACT inhaler, Inhale into the lungs., Disp: , Rfl:  .  Fluticasone-Salmeterol (ADVAIR) 100-50 MCG/DOSE AEPB, Inhale 1 puff into the lungs 2 (two) times daily. , Disp: , Rfl:  .  hydrochlorothiazide (HYDRODIURIL) 12.5 MG tablet, Take by mouth., Disp: , Rfl:  .  ibuprofen (ADVIL) 800 MG tablet, Take 1 tablet (800 mg total) by mouth every 8 (eight) hours as needed., Disp: 21 tablet, Rfl: 0 .  levocetirizine (XYZAL) 5 MG tablet, Take 5 mg by mouth every evening., Disp: , Rfl:  .  LORazepam (ATIVAN) 0.5 MG tablet, Take 0.5 mg by mouth 2 (two) times daily as needed. , Disp: , Rfl:  .  meloxicam (MOBIC) 15 MG tablet, Take 15 mg by mouth daily. , Disp: , Rfl:  .  Misc Natural Products (GLUCOSAMINE CHOND  COMPLEX/MSM PO), Take by mouth 2 (two) times daily., Disp: , Rfl:  .  montelukast (SINGULAIR) 10 MG tablet, Take 1 tablet (10 mg total) by mouth at bedtime. appt further refills, Disp: 90 tablet, Rfl: 1 .  Multiple Vitamins-Minerals (ICAPS AREDS 2 PO), Take 1 capsule by mouth 2 (two) times daily., Disp: , Rfl:  .  olmesartan-hydrochlorothiazide (BENICAR HCT) 40-12.5 MG tablet, Take 1 tablet by mouth daily., Disp: , Rfl:  .  pramipexole (MIRAPEX) 1.5 MG tablet, Take 1.5 mg by mouth 3 (three) times daily., Disp: , Rfl:  .  TURMERIC CURCUMIN PO, Take by mouth daily., Disp: , Rfl:  .  venlafaxine XR (EFFEXOR-XR) 150 MG 24 hr capsule, Take 1 capsule (150 mg total) by mouth daily with breakfast., Disp: 90 capsule, Rfl: 3 .  VITAMIN A PO, Take 1 tablet by mouth daily., Disp: , Rfl:  .  VITAMIN D, CHOLECALCIFEROL, PO, Take by mouth., Disp: , Rfl:  No current facility-administered medications for this visit.  Facility-Administered Medications Ordered in Other Visits:  .  [COMPLETED] sodium chloride 0.9 % nebulizer solution 3 mL, 3 mL, Nebulization, Once, 3 mL at 10/03/18 1130 **FOLLOWED BY** [COMPLETED] methacholine (PROVOCHOLINE) inhaler solution 0.125 mg, 2 mL, Inhalation, Once, 0.125 mg at 10/03/18 1139 **FOLLOWED BY** methacholine (PROVOCHOLINE) inhaler solution 0.5 mg, 2 mL, Inhalation, Once **FOLLOWED BY** methacholine (PROVOCHOLINE) inhaler solution 2 mg, 2 mL, Inhalation, Once **FOLLOWED BY** methacholine (PROVOCHOLINE) inhaler  solution 8 mg, 2 mL, Inhalation, Once **FOLLOWED BY** methacholine (PROVOCHOLINE) inhaler solution 32 mg, 2 mL, Inhalation, Once **FOLLOWED BY** [COMPLETED] albuterol (PROVENTIL) (2.5 MG/3ML) 0.083% nebulizer solution 2.5 mg, 2.5 mg, Nebulization, Once, Ottie Glazier, MD, 2.5 mg at 10/03/18 1146  Past Medical History: Past Medical History:  Diagnosis Date  . Arthritis   . Asthma   . Chronic cough   . Colon polyps   . Depression   . GERD (gastroesophageal reflux disease)    . Hiatal hernia   . History of chicken pox   . Macular degeneration   . Multinodular goiter    FNA in past neg h/o thyroid cysts  . Pernicious anemia   . RLS (restless legs syndrome)     Tobacco Use: Social History   Tobacco Use  Smoking Status Never Smoker  Smokeless Tobacco Never Used    Labs: Recent Review Scientist, physiological    Labs for ITP Cardiac and Pulmonary Rehab Latest Ref Rng & Units 10/12/2018   Hemoglobin A1c 4.6 - 6.5 % 5.6       Pulmonary Assessment Scores: Pulmonary Assessment Scores    Row Name 10/11/19 1243 01/09/20 1539       ADL UCSD   ADL Phase  Entry  Exit    SOB Score total  60  -    Rest  1  -    Walk  4  -    Stairs  4  -    Bath  1  -    Dress  1  -    Shop  4  -      CAT Score   CAT Score  25  -      mMRC Score   mMRC Score  4  1       UCSD: Self-administered rating of dyspnea associated with activities of daily living (ADLs) 6-point scale (0 = "not at all" to 5 = "maximal or unable to do because of breathlessness")  Scoring Scores range from 0 to 120.  Minimally important difference is 5 units  CAT: CAT can identify the health impairment of COPD patients and is better correlated with disease progression.  CAT has a scoring range of zero to 40. The CAT score is classified into four groups of low (less than 10), medium (10 - 20), high (21-30) and very high (31-40) based on the impact level of disease on health status. A CAT score over 10 suggests significant symptoms.  A worsening CAT score could be explained by an exacerbation, poor medication adherence, poor inhaler technique, or progression of COPD or comorbid conditions.  CAT MCID is 2 points  mMRC: mMRC (Modified Medical Research Council) Dyspnea Scale is used to assess the degree of baseline functional disability in patients of respiratory disease due to dyspnea. No minimal important difference is established. A decrease in score of 1 point or greater is considered a positive  change.   Pulmonary Function Assessment:   Exercise Target Goals: Exercise Program Goal: Individual exercise prescription set using results from initial 6 min walk test and THRR while considering  patient's activity barriers and safety.   Exercise Prescription Goal: Initial exercise prescription builds to 30-45 minutes a day of aerobic activity, 2-3 days per week.  Home exercise guidelines will be given to patient during program as part of exercise prescription that the participant will acknowledge.  Education: Aerobic Exercise & Resistance Training: - Gives group verbal and written instruction on the various components of exercise.  Focuses on aerobic and resistive training programs and the benefits of this training and how to safely progress through these programs..   Education: Exercise & Equipment Safety: - Individual verbal instruction and demonstration of equipment use and safety with use of the equipment.   Pulmonary Rehab from 10/11/2019 in [facility] Cardiac and Pulmonary Rehab  Date  10/11/19  Educator  [facility]  Instruction Review Code  1- Verbalizes Understanding      Education: Exercise Physiology & General Exercise Guidelines: - Group verbal and written instruction with models to review the exercise physiology of the cardiovascular system and associated critical values. Provides general exercise guidelines with specific guidelines to those with heart or lung disease.    Education: Flexibility, Balance, Mind/Body Relaxation: Provides group verbal/written instruction on the benefits of flexibility and balance training, including mind/body exercise modes such as yoga, pilates and tai chi.  Demonstration and skill practice provided.   Activity Barriers & Risk Stratification: Activity Barriers & Cardiac Risk Stratification - 10/11/19 1238      Activity Barriers & Cardiac Risk Stratification   Activity Barriers  Deconditioning;Joint Problems;Other (comment);Muscular  Weakness;Arthritis;Shortness of Breath    Comments  R knee bone on bone needs surgery once able       6 Minute Walk: 6 Minute Walk    Row Name 10/11/19 1237 01/09/20 0827       6 Minute Walk   Phase  Initial  Discharge    Distance  910 feet  1500 feet    Walk Time  6 minutes  6 minutes    # of Rest Breaks  0  0    MPH  1.72  2.84    METS  1.73  3.06    RPE  12  12    Perceived Dyspnea   2  2    VO2 Peak  6.06  10.7    Symptoms  Yes (comment)  Yes (comment)    Comments  SOB  SOB    Resting HR  89 bpm  74 bpm    Resting BP  124/74  136/74    Resting Oxygen Saturation   97 %  96 %    Exercise Oxygen Saturation  during 6 min walk  93 %  93 %    Max Ex. HR  122 bpm  120 bpm    Max Ex. BP  148/76  166/74    2 Minute Post BP  130/72  136/70      Interval HR   1 Minute HR  107  -    2 Minute HR  113  88    3 Minute HR  119  -    4 Minute HR  121  -    5 Minute HR  122  -    6 Minute HR  117  120    2 Minute Post HR  97  91    Interval Heart Rate?  Yes  Yes pulse oximeter did not register pulse consistently      Interval Oxygen   Interval Oxygen?  Yes  Yes    Baseline Oxygen Saturation %  97 %  96 %    1 Minute Oxygen Saturation %  98 %  96 % Room Air    1 Minute Liters of Oxygen  0 L Room Air  0 L    2 Minute Oxygen Saturation %  94 %  93 %    2 Minute Liters of Oxygen  0  L  0 L    3 Minute Oxygen Saturation %  93 %  94 %    3 Minute Liters of Oxygen  0 L  0 L    4 Minute Oxygen Saturation %  94 %  94 %    4 Minute Liters of Oxygen  0 L  0 L    5 Minute Oxygen Saturation %  93 %  94 %    5 Minute Liters of Oxygen  0 L  0 L    6 Minute Oxygen Saturation %  94 %  93 %    6 Minute Liters of Oxygen  0 L  0 L    2 Minute Post Oxygen Saturation %  99 %  98 %    2 Minute Post Liters of Oxygen  0 L  0 L      Oxygen Initial Assessment: Oxygen Initial Assessment - 10/10/19 1418      Home Oxygen   Home Oxygen Device  None    Sleep Oxygen Prescription  None   was supposed  to use CPAP but sent back as it was not working with asthma   Home Exercise Oxygen Prescription  None    Home at Rest Exercise Oxygen Prescription  None      Initial 6 min Walk   Oxygen Used  None      Program Oxygen Prescription   Program Oxygen Prescription  None      Intervention   Short Term Goals  To learn and understand importance of monitoring SPO2 with pulse oximeter and demonstrate accurate use of the pulse oximeter.;To learn and understand importance of maintaining oxygen saturations>88%;To learn and demonstrate proper pursed lip breathing techniques or other breathing techniques.;To learn and demonstrate proper use of respiratory medications    Long  Term Goals  Verbalizes importance of monitoring SPO2 with pulse oximeter and return demonstration;Maintenance of O2 saturations>88%;Exhibits proper breathing techniques, such as pursed lip breathing or other method taught during program session;Compliance with respiratory medication;Demonstrates proper use of MDI's       Oxygen Re-Evaluation: Oxygen Re-Evaluation    Row Name 10/15/19 0817 10/29/19 0814 12/10/19 0752 12/24/19 0757       Program Oxygen Prescription   Program Oxygen Prescription  None  None  None  None      Home Oxygen   Home Oxygen Device  None  None  None  None    Sleep Oxygen Prescription  None  None  None  None    Home Exercise Oxygen Prescription  None  None  None  None    Home at Rest Exercise Oxygen Prescription  None  None  None  None    Compliance with Home Oxygen Use  - NA  -  -  -      Goals/Expected Outcomes   Short Term Goals  To learn and understand importance of monitoring SPO2 with pulse oximeter and demonstrate accurate use of the pulse oximeter.;To learn and understand importance of maintaining oxygen saturations>88%;To learn and demonstrate proper pursed lip breathing techniques or other breathing techniques.  To learn and understand importance of monitoring SPO2 with pulse oximeter and  demonstrate accurate use of the pulse oximeter.;To learn and understand importance of maintaining oxygen saturations>88%;To learn and demonstrate proper pursed lip breathing techniques or other breathing techniques.  To learn and understand importance of monitoring SPO2 with pulse oximeter and demonstrate accurate use of the pulse oximeter.;To learn and understand importance of maintaining oxygen  saturations>88%;To learn and demonstrate proper pursed lip breathing techniques or other breathing techniques.;To learn and demonstrate proper use of respiratory medications  To learn and understand importance of monitoring SPO2 with pulse oximeter and demonstrate accurate use of the pulse oximeter.;To learn and understand importance of maintaining oxygen saturations>88%;To learn and demonstrate proper pursed lip breathing techniques or other breathing techniques.;To learn and demonstrate proper use of respiratory medications    Long  Term Goals  Exhibits compliance with exercise, home and travel O2 prescription;Verbalizes importance of monitoring SPO2 with pulse oximeter and return demonstration;Maintenance of O2 saturations>88%;Exhibits proper breathing techniques, such as pursed lip breathing or other method taught during program session  Verbalizes importance of monitoring SPO2 with pulse oximeter and return demonstration;Maintenance of O2 saturations>88%;Exhibits proper breathing techniques, such as pursed lip breathing or other method taught during program session  Verbalizes importance of monitoring SPO2 with pulse oximeter and return demonstration;Maintenance of O2 saturations>88%;Exhibits proper breathing techniques, such as pursed lip breathing or other method taught during program session;Compliance with respiratory medication;Demonstrates proper use of MDI's  Verbalizes importance of monitoring SPO2 with pulse oximeter and return demonstration;Maintenance of O2 saturations>88%;Exhibits proper breathing  techniques, such as pursed lip breathing or other method taught during program session;Compliance with respiratory medication;Demonstrates proper use of MDI's    Comments  Reviewed PLB technique with pt.  Talked about how it works and it's importance in maintaining their exercise saturations.  Sruthi is doing well with her PLB and has gotten a pulse oximeter to monitor her oxygen levels at home.  So far they are doing well.  Anyiah has noticed that her breathing has gotten worse over the last couple of days and she has needed to do a breathing treatment to help.  It is enough that she does not need her inhaler.  She is working on her PLB and it is getting better.  Caliya is doing well with her breathing.  She has not needed  to use her nebulizer recently. She uses her PLB when she thinks about it.  Overall, she is doing better with breathing.    Goals/Expected Outcomes  Short: Become more profiecient at using PLB.   Long: Become independent at using PLB.  Short: Continue to ues PLB to help with  SOB in AM.  Long: Continue to monitor oxygen levels.  Short: Continue to use PLB and breathing treatments to help.  Long: Continue to monitor oxygen and breathing at home.  Short: Continue to use PLB more frequently  Long: Continue to keep eye on oxygen       Oxygen Discharge (Final Oxygen Re-Evaluation): Oxygen Re-Evaluation - 12/24/19 0757      Program Oxygen Prescription   Program Oxygen Prescription  None      Home Oxygen   Home Oxygen Device  None    Sleep Oxygen Prescription  None    Home Exercise Oxygen Prescription  None    Home at Rest Exercise Oxygen Prescription  None      Goals/Expected Outcomes   Short Term Goals  To learn and understand importance of monitoring SPO2 with pulse oximeter and demonstrate accurate use of the pulse oximeter.;To learn and understand importance of maintaining oxygen saturations>88%;To learn and demonstrate proper pursed lip breathing techniques or other breathing  techniques.;To learn and demonstrate proper use of respiratory medications    Long  Term Goals  Verbalizes importance of monitoring SPO2 with pulse oximeter and return demonstration;Maintenance of O2 saturations>88%;Exhibits proper breathing techniques, such as pursed lip breathing or  other method taught during program session;Compliance with respiratory medication;Demonstrates proper use of MDI's    Comments  Sitara is doing well with her breathing.  She has not needed  to use her nebulizer recently. She uses her PLB when she thinks about it.  Overall, she is doing better with breathing.    Goals/Expected Outcomes  Short: Continue to use PLB more frequently  Long: Continue to keep eye on oxygen       Initial Exercise Prescription: Initial Exercise Prescription - 10/11/19 1200      Date of Initial Exercise RX and Referring Provider   Date  10/11/19    Referring Provider  Claudette Stapler MD      Treadmill   MPH  1.5    Grade  0.5    Minutes  15    METs  2.25      NuStep   Level  1    SPM  80    Minutes  15    METs  2      T5 Nustep   Level  1    SPM  80    Minutes  15    METs  2      Biostep-RELP   Level  1    SPM  50    Minutes  15    METs  2      Prescription Details   Frequency (times per week)  3    Duration  Progress to 30 minutes of continuous aerobic without signs/symptoms of physical distress      Intensity   THRR 40-80% of Max Heartrate  114-139    Ratings of Perceived Exertion  11-13    Perceived Dyspnea  0-4      Progression   Progression  Continue to progress workloads to maintain intensity without signs/symptoms of physical distress.      Resistance Training   Training Prescription  Yes    Weight  3 lb    Reps  10-15       Perform Capillary Blood Glucose checks as needed.  Exercise Prescription Changes: Exercise Prescription Changes    Row Name 10/11/19 1200 10/17/19 0900 10/23/19 0800 10/31/19 1400 11/15/19 0800     Response to Exercise    Blood Pressure (Admit)  124/74  138/88  -  126/74  100/60   Blood Pressure (Exercise)  148/76  152/68  -  110/64  102/70   Blood Pressure (Exit)  130/72  130/78  -  126/70  122/60   Heart Rate (Admit)  89 bpm  81 bpm  -  82 bpm  83 bpm   Heart Rate (Exercise)  122 bpm  114 bpm  -  90 bpm  98 bpm   Heart Rate (Exit)  91 bpm  90 bpm  -  70 bpm  -   Oxygen Saturation (Admit)  97 %  96 %  -  96 %  96 %   Oxygen Saturation (Exercise)  93 %  97 %  -  93 %  94 %   Oxygen Saturation (Exit)  98 %  98 %  -  96 %  94 %   Rating of Perceived Exertion (Exercise)  12  13  -  13  12   Perceived Dyspnea (Exercise)  2  3  -  1  1   Symptoms  SOb  -  -  none  none   Comments  walk test results  second day   -  -  -  Duration  -  Continue with 30 min of aerobic exercise without signs/symptoms of physical distress.  -  Continue with 30 min of aerobic exercise without signs/symptoms of physical distress.  Continue with 30 min of aerobic exercise without signs/symptoms of physical distress.   Intensity  -  THRR unchanged  -  THRR unchanged  THRR unchanged     Progression   Progression  -  Continue to progress workloads to maintain intensity without signs/symptoms of physical distress.  -  Continue to progress workloads to maintain intensity without signs/symptoms of physical distress.  Continue to progress workloads to maintain intensity without signs/symptoms of physical distress.   Average METs  -  2  -  2.06  2.8     Resistance Training   Training Prescription  -  Yes  -  Yes  Yes   Weight  -  3 lb  -  3 lb   5 lb   Reps  -  10-15  -  10-15  10-15     Interval Training   Interval Training  -  No  -  No  No     Treadmill   MPH  -  1.5  -  1.5  -   Grade  -  0.5  -  0.5  -   Minutes  -  15  -  15  -   METs  -  2.25  -  2.25  -     NuStep   Level  -  -  -  2  6   SPM  -  -  -  -  80   Minutes  -  -  -  15  15   METs  -  -  -  2  3.6     T5 Nustep   Level  -  3  -  3  -   SPM  -  80  -  -  -    Minutes  -  15  -  15  -   METs  -  2  -  2  -     Biostep-RELP   Level  -  -  -  4  6   SPM  -  -  -  -  50   Minutes  -  -  -  15  15   METs  -  -  -  2  2     Home Exercise Plan   Plans to continue exercise at  -  -  [employer] (comment) walking, gym, [employer] (comment) walking, gym, [employer] (comment) walking, gym, [employer]  -  -  Add 2 additional days to program exercise sessions.  Add 2 additional days to program exercise sessions.  Add 2 additional days to program exercise sessions.   Initial Home Exercises Provided  -  -  10/23/19  10/23/19  10/23/19   Row Name 11/27/19 1500 12/14/19 0800 12/25/19 1600 01/07/20 1700       Response to Exercise   Blood Pressure (Admit)  100/58  126/64  134/74  126/74    Blood Pressure (Exercise)  134/74  136/74  124/60  130/60    Blood Pressure (Exit)  92/60  112/58  136/74  126/74    Heart Rate (Admit)  85 bpm  64 bpm  83 bpm  74 bpm    Heart Rate (Exercise)  110 bpm  107 bpm  98 bpm  104 bpm    Heart Rate (Exit)  98 bpm  85 bpm  87 bpm  78 bpm    Oxygen Saturation (Admit)  95 %  97 %  95 %  93 %    Oxygen Saturation (Exercise)  93 %  95 %  92 %  93 %    Oxygen Saturation (Exit)  95 %  95 %  93 %  96 %    Rating of Perceived Exertion (Exercise)  _0 Perceived Dyspnea (Exercise)  _1 Symptoms  none  none  none  none    Duration  Continue with 30 min of aerobic exercise without signs/symptoms of physical distress.  Continue with 30 min of aerobic exercise without signs/symptoms of physical distress.  Continue with 30 min of aerobic exercise without signs/symptoms of physical distress.  Continue with 30 min of aerobic exercise without signs/symptoms of physical distress.    Intensity  THRR unchanged  THRR unchanged  THRR unchanged  THRR unchanged      Progression   Progression  Continue to progress workloads to maintain intensity without signs/symptoms of physical distress.   Continue to progress workloads to maintain intensity without signs/symptoms of physical distress.  Continue to progress workloads to maintain intensity without signs/symptoms of physical distress.  Continue to progress workloads to maintain intensity without signs/symptoms of physical distress.    Average METs  3.65  3.6  3.56  3.2      Resistance Training   Training Prescription  Yes  Yes  Yes  Yes    Weight   5 lb  6 lb  6 lb  6 lb    Reps  10-15  10-15  10-15  10-15      Interval Training   Interval Training  No  No  No  No      Treadmill   MPH  2.5  2.6  2.3  2.3    Grade  0  1  0  0    Minutes  _2 METs  2.91  3.35  2.76  2.76      NuStep   Level  _3 -    SPM  -  80  -  -    Minutes  _4 -    METs  5.3  3.8  3  -      T5 Nustep   Level  5  -  6  6    SPM  -  -  -  80    Minutes  15  -  15  15    METs  2.4  -  3.5  3.6      Biostep-RELP   Level  7  -  7  -    Minutes  15  -  15  -    METs  4  -  5  -      Home Exercise Plan   Plans to continue exercise at  [employer] (comment) walking, gym, [employer] (comment) walking, gym, [employer] (comment) walking, gym, [employer] (comment) walking, gym, [employer]  Add 2 additional days to program exercise sessions.  Add 2 additional  days to program exercise sessions.  Add 2 additional days to program exercise sessions.  Add 2 additional days to program exercise sessions.    Initial Home Exercises Provided  10/23/19  10/23/19  10/23/19  10/23/19       Exercise Comments: Exercise Comments    Row Name 10/15/19 0759           Exercise Comments  First full day of exercise!  Patient was oriented to gym and equipment including functions, settings, policies, and procedures.  Patient's individual exercise prescription and treatment plan were reviewed.  All starting workloads were established based on the results of the 6 minute walk test done at  initial orientation visit.  The plan for exercise progression was also introduced and progression will be customized based on patient's performance and goals.          Exercise Goals and Review: Exercise Goals    Row Name 10/11/19 1241             Exercise Goals   Increase Physical Activity  Yes       Intervention  Provide advice, education, support and counseling about physical activity/exercise needs.;Develop an individualized exercise prescription for aerobic and resistive training based on initial evaluation findings, risk stratification, comorbidities and participant's personal goals.       Expected Outcomes  Short Term: Attend rehab on a regular basis to increase amount of physical activity.;Long Term: Add in home exercise to make exercise part of routine and to increase amount of physical activity.;Long Term: Exercising regularly at least 3-5 days a week.       Increase Strength and Stamina  Yes       Intervention  Provide advice, education, support and counseling about physical activity/exercise needs.;Develop an individualized exercise prescription for aerobic and resistive training based on initial evaluation findings, risk stratification, comorbidities and participant's personal goals.       Expected Outcomes  Short Term: Increase workloads from initial exercise prescription for resistance, speed, and METs.;Short Term: Perform resistance training exercises routinely during rehab and add in resistance training at home;Long Term: Improve cardiorespiratory fitness, muscular endurance and strength as measured by increased METs and functional capacity (6MWT)       Able to understand and use rate of perceived exertion (RPE) scale  Yes       Intervention  Provide education and explanation on how to use RPE scale       Expected Outcomes  Short Term: Able to use RPE daily in rehab to express subjective intensity level;Long Term:  Able to use RPE to guide intensity level when exercising  independently       Able to understand and use Dyspnea scale  Yes       Intervention  Provide education and explanation on how to use Dyspnea scale       Expected Outcomes  Short Term: Able to use Dyspnea scale daily in rehab to express subjective sense of shortness of breath during exertion;Long Term: Able to use Dyspnea scale to guide intensity level when exercising independently       Knowledge and understanding of Target Heart Rate Range (THRR)  Yes       Intervention  Provide education and explanation of THRR including how the numbers were predicted and where they are located for reference       Expected Outcomes  Short Term: Able to state/look up THRR;Long Term: Able to use THRR to govern intensity when exercising independently;Short Term: Able to use  daily as guideline for intensity in rehab       Able to check pulse independently  Yes       Intervention  Provide education and demonstration on how to check pulse in carotid and radial arteries.;Review the importance of being able to check your own pulse for safety during independent exercise       Expected Outcomes  Short Term: Able to explain why pulse checking is important during independent exercise;Long Term: Able to check pulse independently and accurately       Understanding of Exercise Prescription  Yes       Intervention  Provide education, explanation, and written materials on patient's individual exercise prescription       Expected Outcomes  Short Term: Able to explain program exercise prescription;Long Term: Able to explain home exercise prescription to exercise independently          Exercise Goals Re-Evaluation : Exercise Goals Re-Evaluation    Row Name 10/15/19 0815 10/23/19 0804 10/29/19 0809 10/31/19 1415 11/15/19 0857     Exercise Goal Re-Evaluation   Exercise Goals Review  Able to understand and use Dyspnea scale;Able to understand and use rate of perceived exertion (RPE) scale;Knowledge and understanding of Target Heart  Rate Range (THRR);Understanding of Exercise Prescription  Able to understand and use Dyspnea scale;Able to understand and use rate of perceived exertion (RPE) scale;Knowledge and understanding of Target Heart Rate Range (THRR);Understanding of Exercise Prescription;Increase Physical Activity;Increase Strength and Stamina;Able to check pulse independently  Increase Physical Activity;Increase Strength and Stamina;Understanding of Exercise Prescription  Increase Physical Activity;Increase Strength and Stamina;Understanding of Exercise Prescription  Increase Physical Activity;Increase Strength and Stamina;Able to understand and use rate of perceived exertion (RPE) scale;Able to understand and use Dyspnea scale;Knowledge and understanding of Target Heart Rate Range (THRR);Able to check pulse independently;Understanding of Exercise Prescription   Comments  Reviewed RPE scale, THR and program prescription with pt today.  Pt voiced understanding and was given a copy of goals to take home.  Reviewed home exercise with pt today.  Pt plans to walk, go to [facility], and use videos for exercise.  She is planning to get a pulse oximeter from [employer]. Reviewed THR, pulse, RPE, sign and symptoms, and when to call 911 or MD.  Also discussed weather considerations and indoor options.  Pt voiced understanding.  Minsa is off to good start.  She is already getting in her weights at house and going to gym at apartment complex.  She has gotten her pulse oximeter to monitor herself on treadmill at home.  Axie has been doing well in rehab. She is up to level 4 on the BioStep.  We will continue to monitor her progress.  Audrianna has movd up to level 6 on NS and Biostep.  She is up to 5 lb for strength work.   Expected Outcomes  Short: Use RPE daily to regulate intensity. Long: Follow program prescription in THR.  Short: Start to add in exercise at home.  Long: Continue to improve stamina.  Short: Continue to exercise on off days.  Long;  Continue to attend regularly.  Short: Increase workload on NuStep.  Long: Continue to exercise on off days.  Short : continue to exercise consistently Long:  increase MET level   Row Name 11/27/19 1542 12/10/19 0743 12/14/19 0816 12/24/19 0753 01/07/20 1723     Exercise Goal Re-Evaluation   Exercise Goals Review  Increase Physical Activity;Increase Strength and Stamina;Understanding of Exercise Prescription  Increase Physical Activity;Increase Strength and  Stamina;Understanding of Exercise Prescription  Increase Physical Activity;Increase Strength and Stamina;Able to understand and use rate of perceived exertion (RPE) scale;Able to understand and use Dyspnea scale;Knowledge and understanding of Target Heart Rate Range (THRR);Able to check pulse independently;Understanding of Exercise Prescription  Increase Physical Activity;Increase Strength and Stamina;Understanding of Exercise Prescription  Increase Physical Activity;Increase Strength and Stamina;Able to understand and use rate of perceived exertion (RPE) scale;Able to understand and use Dyspnea scale;Knowledge and understanding of Target Heart Rate Range (THRR);Able to check pulse independently;Understanding of Exercise Prescription   Comments  Jazsmin has been doing well in rehab. She was out on vacation but came extra days to make up her time. She was also planning to walk while she was gone.  She is now on level 7 for both NuStep and BioStep.  We will continue to monitor her progress.  Pegeen is doing well in rehab.  She admits that she could do better with her home exercise.  Her knee has been bothering her at home and she won't go walking.  She is supposed to go back to doctor to set up surgery.  She normally goes to apartment gym to walk on treadmill, but has been consistent with her weights.  We talked about adding in the staff videos for the chair exercises for her knee days.  Corrissa works at [employer] and has increased to 6 lb weights.  Staff will  monitor progress.  Stepanie is doing well in rehab.  She is walking at home on her off days for about 20 min on hilly terrain.  She is getting her strength and stamina back!  Deshana attends consistently and works at [employer].  Staff will monitor progress.   Expected Outcomes  Short: Continue to improve workloads. Long: Continue to improve stamina.  Short: Add in chair exercises  Long: Continue to improve strength for surgery.  Short:  continue consistent exercise Long:  build overall stamina  Short: Continue to exercise on her off days  Long: Continue to improve stamina.  Short: continue to exercise on her own Long: increase MET level      Discharge Exercise Prescription (Final Exercise Prescription Changes): Exercise Prescription Changes - 01/07/20 1700      Response to Exercise   Blood Pressure (Admit)  126/74    Blood Pressure (Exercise)  130/60    Blood Pressure (Exit)  126/74    Heart Rate (Admit)  74 bpm    Heart Rate (Exercise)  104 bpm    Heart Rate (Exit)  78 bpm    Oxygen Saturation (Admit)  93 %    Oxygen Saturation (Exercise)  93 %    Oxygen Saturation (Exit)  96 %    Rating of Perceived Exertion (Exercise)  15    Perceived Dyspnea (Exercise)  3    Symptoms  none    Duration  Continue with 30 min of aerobic exercise without signs/symptoms of physical distress.    Intensity  THRR unchanged      Progression   Progression  Continue to progress workloads to maintain intensity without signs/symptoms of physical distress.    Average METs  3.2      Resistance Training   Training Prescription  Yes    Weight  6 lb    Reps  10-15      Interval Training   Interval Training  No      Treadmill   MPH  2.3    Grade  0    Minutes  15    METs  2.76      T5 Nustep   Level  6    SPM  80    Minutes  15    METs  3.6      Home Exercise Plan   Plans to continue exercise at  [employer] (comment)   walking, gym, [employer]   Frequency  Add 2 additional days to program exercise  sessions.    Initial Home Exercises Provided  10/23/19       Nutrition:  Target Goals: Understanding of nutrition guidelines, daily intake of sodium <1572m, cholesterol <2073m calories 30% from fat and 7% or less from saturated fats, daily to have 5 or more servings of fruits and vegetables.  Education: Controlling Sodium/Reading Food Labels -Group verbal and written material supporting the discussion of sodium use in heart healthy nutrition. Review and explanation with models, verbal and written materials for utilization of the food label.   Education: General Nutrition Guidelines/Fats and Fiber: -Group instruction provided by verbal, written material, models and posters to present the general guidelines for heart healthy nutrition. Gives an explanation and review of dietary fats and fiber.   Biometrics: Pre Biometrics - 10/11/19 1241      Pre Biometrics   Height  5' 6.75" (1.695 m)    Weight  273 lb 14.4 oz (124.2 kg)    BMI (Calculated)  43.24    Single Leg Stand  6.76 seconds      Post Biometrics - 01/09/20 0844       Post  Biometrics   Height  5' 6.75" (1.695 m)    Weight  256 lb 6.4 oz (116.3 kg)    BMI (Calculated)  40.48    Single Leg Stand  10.7 seconds       Nutrition Therapy Plan and Nutrition Goals: Nutrition Therapy & Goals - 11/08/19 1009      Nutrition Therapy   Diet  HH, Low Na    Protein (specify units)  100-105g    Fiber  25 grams    Whole Grain Foods  3 servings    Saturated Fats  12 max. grams    Fruits and Vegetables  5 servings/day    Sodium  1.5 grams      Personal Nutrition Goals   Nutrition Goal  ST: repleace honey wheat with 100% wheat LT: eating better, weight loss    Comments  B instant grits, egg (scrambled) with bacon, peanut butter with natures own honey wheat and skippy's honey. tea - diet tea or coffee - cream, stevia. L: sandwich (pb or cheese or ham) D: salad (chicken caesar salad packet) with walnuts, bacon bits, extra cheese,  seasame sticks. S: a few ritz crackers or kettle corn (rice cakes). Drinks: diet tea. trying to add more water (will usually add crystal lite). Pt reports not feeling hungry. energy improved since starting rehab 6-7/10 (was 0/10). Discussed HH eating.      Intervention Plan   Intervention  Prescribe, educate and counsel regarding individualized specific dietary modifications aiming towards targeted core components such as weight, hypertension, lipid management, diabetes, heart failure and other comorbidities.;Nutrition handout(s) given to patient.    Expected Outcomes  Short Term Goal: Understand basic principles of dietary content, such as calories, fat, sodium, cholesterol and nutrients.;Short Term Goal: A plan has been developed with personal nutrition goals set during dietitian appointment.;Long Term Goal: Adherence to prescribed nutrition plan.       Nutrition Assessments: Nutrition Assessments - 10/11/19  1242      MEDFICTS Scores   Pre Score  29       MEDIFICTS Score Key:          ?70 Need to make dietary changes          40-70 Heart Healthy Diet         ? 40 Therapeutic Level Cholesterol Diet  Nutrition Goals Re-Evaluation: Nutrition Goals Re-Evaluation    [place] Name 12/03/19 0815             Goals   Nutrition Goal  ST: continue with current changes LT: eating better, weight loss       Comment  Pt changed over the 100% whole wheat and bought carrots and apples to have for snacks. Pt reports enjoying that on its own but when she wants more will add peanut butter. Discussed low/no salt and low/no sugar added peanut butter, pt showed understanding. Pt reports would like to continue with current changes.       Expected Outcome  ST: continue with current changes LT: eating better, weight loss          Nutrition Goals Discharge (Final Nutrition Goals Re-Evaluation): Nutrition Goals Re-Evaluation - 12/03/19 0815      Goals   Nutrition Goal  ST: continue with current changes LT:  eating better, weight loss    Comment  Pt changed over the 100% whole wheat and bought carrots and apples to have for snacks. Pt reports enjoying that on its own but when she wants more will add peanut butter. Discussed low/no salt and low/no sugar added peanut butter, pt showed understanding. Pt reports would like to continue with current changes.    Expected Outcome  ST: continue with current changes LT: eating better, weight loss       Psychosocial: Target Goals: Acknowledge presence or absence of significant depression and/or stress, maximize coping skills, provide positive support system. Participant is able to verbalize types and ability to use techniques and skills needed for reducing stress and depression.   Education: Depression - Provides group verbal and written instruction on the correlation between heart/lung disease and depressed mood, treatment options, and the stigmas associated with seeking treatment.   Education: Sleep Hygiene -Provides group verbal and written instruction about how sleep can affect your health.  Define sleep hygiene, discuss sleep cycles and impact of sleep habits. Review good sleep hygiene tips.    Education: Stress and Anxiety: - Provides group verbal and written instruction about the health risks of elevated stress and causes of high stress.  Discuss the correlation between heart/lung disease and anxiety and treatment options. Review healthy ways to manage with stress and anxiety.   Initial Review & Psychosocial Screening: Initial Psych Review & Screening - 10/10/19 1420      Initial Review   Current issues with  Current Stress Concerns;History of Depression;Current Depression    Source of Stress Concerns  Chronic Illness;Financial    Comments  long history of pulm disease, finances are stressful and in a day to day mangament phase, not currently having symptoms      Family Dynamics   Good Support System?  No   friends [facility] and [place]) able  to call when needed   Concerns  Inappropriate over/under dependence on family/friends      Barriers   Psychosocial barriers to participate in program  The patient should benefit from training in stress management and relaxation.;Psychosocial barriers identified (see note)      Screening  Interventions   Interventions  Provide feedback about the scores to participant;To provide support and resources with identified psychosocial needs;Encouraged to exercise    Expected Outcomes  Short Term goal: Utilizing psychosocial counselor, staff and physician to assist with identification of specific Stressors or current issues interfering with healing process. Setting desired goal for each stressor or current issue identified.;Long Term Goal: Stressors or current issues are controlled or eliminated.;Short Term goal: Identification and review with participant of any Quality of Life or Depression concerns found by scoring the questionnaire.;Long Term goal: The participant improves quality of Life and PHQ9 Scores as seen by post scores and/or verbalization of changes       Quality of Life Scores:  Scores of 19 and below usually indicate a poorer quality of life in these areas.  A difference of  2-3 points is a clinically meaningful difference.  A difference of 2-3 points in the total score of the Quality of Life Index has been associated with significant improvement in overall quality of life, self-image, physical symptoms, and general health in studies assessing change in quality of life.  PHQ-9: Recent Review Flowsheet Data    Depression screen [facility] 2/9 12/24/2019 12/10/2019 10/29/2019 10/11/2019 05/21/2019   Decreased Interest 0 2 0 1 0   Down, Depressed, Hopeless 0 3 0 1 0   PHQ - 2 Score 0 5 0 2 0   Altered sleeping 2 2 0 3 0   Tired, decreased energy 1 2 0 3 0   Change in appetite 0 0 0 3 0   Feeling bad or failure about yourself  0 1 0 1 0   Trouble concentrating 1 1 0 1 0   Moving slowly or  fidgety/restless 0 0 0 0 0   Suicidal thoughts 0 0 0 0 0   PHQ-9 Score 4 11 0 13 0   Difficult doing work/chores Not difficult at all Somewhat difficult Not difficult at all Somewhat difficult Not difficult at all     Interpretation of Total Score  Total Score Depression Severity:  1-4 = Minimal depression, 5-9 = Mild depression, 10-14 = Moderate depression, 15-19 = Moderately severe depression, 20-27 = Severe depression   Psychosocial Evaluation and Intervention: Psychosocial Evaluation - 10/10/19 1428      Psychosocial Evaluation & Interventions   Interventions  Stress management education;Encouraged to exercise with the program and follow exercise prescription    Comments  Paytan is coming into pulmonary rehab with hx of asthma and pulmonary hypertension.  She lives by herself but has friends in [place] that she can call on for support.  She has a history of depression with some good days and bad days, mostly good recently.  She is feeling pretty good overall.  She is looking forward to rehab and wants to get back to feeling better. She wants to be able to breathe better and lose weight so that she can go walking again!!  She does have some financial strain being on her own and we talked about letting us know if it becomes a burden and she needs help.    Expected Outcomes  Short: Attend rehab to build stamina and work on weight loss  Long: Stay positive and get to walking again.       Psychosocial Re-Evaluation: Psychosocial Re-Evaluation    Row Name 10/29/19 [phone] 12/10/19 0748 12/24/19 0800         Psychosocial Re-Evaluation   Current issues with  Current Stress Concerns;Current Sleep Concerns  Current Stress Concerns;Current Sleep Concerns  Current Stress Concerns;Current Sleep Concerns     Comments  PHQ improved to 0!!  She is feeling better overall.  Her sleep is still disrupted by her coughing, but overall it is good.  She denies symptoms of depression and attributes her  improvements to getting back to working out and coming to class!  Most days she is doing good but does have some depressive symptoms that are keeping her from getting out and going to exericse at home.  She has several test coming up this week and a planned surgery on her knee.  She said that she is not too worried about them, just need to get them done.  Tomara is doing well in rehab.  Her PHQ score has improved as she has gotten moving again and determined to change her mind set.  She goes back to doctor today about her knee.  She is sleeping better still not all night but better.  Her cat wakes her up around 3-4am by pouncing on her.     Expected Outcomes  Short: Continue to exercise for her mentally health.  Long: Continue to boost mental health.  Short: Get out to exericse for mental boost.  Long: Continue to fight depression and cope positively.  Short: Continue to exercise for mood boost  Long: Continue to stay positive.     Interventions  Encouraged to attend Pulmonary Rehabilitation for the exercise  Encouraged to attend Pulmonary Rehabilitation for the exercise  Encouraged to attend Pulmonary Rehabilitation for the exercise     Continue Psychosocial Services   Follow up required by staff  Follow up required by staff  Follow up required by staff        Psychosocial Discharge (Final Psychosocial Re-Evaluation): Psychosocial Re-Evaluation - 12/24/19 0800      Psychosocial Re-Evaluation   Current issues with  Current Stress Concerns;Current Sleep Concerns    Comments  Nekia is doing well in rehab.  Her PHQ score has improved as she has gotten moving again and determined to change her mind set.  She goes back to doctor today about her knee.  She is sleeping better still not all night but better.  Her cat wakes her up around 3-4am by pouncing on her.    Expected Outcomes  Short: Continue to exercise for mood boost  Long: Continue to stay positive.    Interventions  Encouraged to attend Pulmonary  Rehabilitation for the exercise    Continue Psychosocial Services   Follow up required by staff       Education: Education Goals: Education classes will be provided on a weekly basis, covering required topics. Participant will state understanding/return demonstration of topics presented.  Learning Barriers/Preferences:   General Pulmonary Education Topics:  Infection Prevention: - Provides verbal and written material to individual with discussion of infection control including proper hand washing and proper equipment cleaning during exercise session.   Pulmonary Rehab from 10/11/2019 in [facility]  Date  10/11/19  Educator  [facility]  Instruction Review Code  1- Verbalizes Understanding      Falls Prevention: - Provides verbal and written material to individual with discussion of falls prevention and safety.   Pulmonary Rehab from 10/11/2019 in [facility] and Pulmonary Rehab  Date  10/11/19  Educator  [facility]  Instruction Review Code  1- Verbalizes Understanding      Chronic Lung Diseases: - Group verbal and written instruction to review updates, respiratory medications, advancements in  procedures and treatments. Discuss use of supplemental oxygen including available portable oxygen systems, continuous and intermittent flow rates, concentrators, personal use and safety guidelines. Review proper use of inhaler and spacers. Provide informative websites for self-education.    Energy Conservation: - Provide group verbal and written instruction for methods to conserve energy, plan and organize activities. Instruct on pacing techniques, use of adaptive equipment and posture/positioning to relieve shortness of breath.   Triggers and Exacerbations: - Group verbal and written instruction to review types of environmental triggers and ways to prevent exacerbations. Discuss weather changes, air quality and the benefits of nasal washing. Review warning signs and symptoms to help  prevent infections. Discuss techniques for effective airway clearance, coughing, and vibrations.   AED/CPR: - Group verbal and written instruction with the use of models to demonstrate the basic use of the AED with the basic ABC's of resuscitation.   Anatomy and Physiology of the Lungs: - Group verbal and written instruction with the use of models to provide basic lung anatomy and physiology related to function, structure and complications of lung disease.   Anatomy & Physiology of the Heart: - Group verbal and written instruction and models provide basic cardiac anatomy and physiology, with the coronary electrical and arterial systems. Review of Valvular disease and Heart Failure   Cardiac Medications: - Group verbal and written instruction to review commonly prescribed medications for heart disease. Reviews the medication, class of the drug, and side effects.   Other: -Provides group and verbal instruction on various topics (see comments)   Knowledge Questionnaire Score: Knowledge Questionnaire Score - 10/11/19 1242      Knowledge Questionnaire Score   Pre Score  17/18 Education: O2 saturations        Core Components/Risk Factors/Patient Goals at Admission: Personal Goals and Risk Factors at Admission - 10/11/19 1242      Core Components/Risk Factors/Patient Goals on Admission    Weight Management  Yes;Obesity;Weight Loss    Intervention  Weight Management: Develop a combined nutrition and exercise program designed to reach desired caloric intake, while maintaining appropriate intake of nutrient and fiber, sodium and fats, and appropriate energy expenditure required for the weight goal.;Weight Management/Obesity: Establish reasonable short term and long term weight goals.;Weight Management: Provide education and appropriate resources to help participant work on and attain dietary goals.;Obesity: Provide education and appropriate resources to help participant work on and attain  dietary goals.    Admit Weight  273 lb 14.4 oz (124.2 kg)    Goal Weight: Short Term  268 lb (121.6 kg)    Goal Weight: Long Term  260 lb (117.9 kg)    Expected Outcomes  Short Term: Continue to assess and modify interventions until short term weight is achieved;Long Term: Adherence to nutrition and physical activity/exercise program aimed toward attainment of established weight goal;Weight Loss: Understanding of general recommendations for a balanced deficit meal plan, which promotes 1-2 lb weight loss per week and includes a negative energy balance of [phone] kcal/d;Understanding recommendations for meals to include 15-35% energy as protein, 25-35% energy from fat, 35-60% energy from carbohydrates, less than 245m of dietary cholesterol, 20-35 gm of total fiber daily;Understanding of distribution of calorie intake throughout the day with the consumption of 4-5 meals/snacks    Improve shortness of breath with ADL's  Yes    Intervention  Provide education, individualized exercise plan and daily activity instruction to help decrease symptoms of SOB with activities of daily living.    Expected Outcomes  Short Term:  Improve cardiorespiratory fitness to achieve a reduction of symptoms when performing ADLs;Long Term: Be able to perform more ADLs without symptoms or delay the onset of symptoms    Hypertension  Yes    Intervention  Provide education on lifestyle modifcations including regular physical activity/exercise, weight management, moderate sodium restriction and increased consumption of fresh fruit, vegetables, and low fat dairy, alcohol moderation, and smoking cessation.;Monitor prescription use compliance.    Expected Outcomes  Long Term: Maintenance of blood pressure at goal levels.;Short Term: Continued assessment and intervention until BP is < 140/33m HG in hypertensive participants. < 130/821mHG in hypertensive participants with diabetes, heart failure or chronic kidney disease.    Lipids  Yes     Intervention  Provide education and support for participant on nutrition & aerobic/resistive exercise along with prescribed medications to achieve LDL <7048mHDL >33m79m  Expected Outcomes  Short Term: Participant states understanding of desired cholesterol values and is compliant with medications prescribed. Participant is following exercise prescription and nutrition guidelines.;Long Term: Cholesterol controlled with medications as prescribed, with individualized exercise RX and with personalized nutrition plan. Value goals: LDL < 70mg33mL > 40 mg.       Education:Diabetes - Individual verbal and written instruction to review signs/symptoms of diabetes, desired ranges of glucose level fasting, after meals and with exercise. Acknowledge that pre and post exercise glucose checks will be done for 3 sessions at entry of program.   Education: Know Your Numbers and Risk Factors: -Group verbal and written instruction about important numbers in your health.  Discussion of what are risk factors and how they play a role in the disease process.  Review of Cholesterol, Blood Pressure, Diabetes, and BMI and the role they play in your overall health.   Core Components/Risk Factors/Patient Goals Review:  Goals and Risk Factor Review    Row Name 10/29/19 0810 12/10/19 0750 12/24/19 0755         Core Components/Risk Factors/Patient Goals Review   Personal Goals Review  Weight Management/Obesity;Improve shortness of breath with ADL's;Hypertension;Lipids  Weight Management/Obesity;Improve shortness of breath with ADL's;Hypertension;Lipids  Weight Management/Obesity;Improve shortness of breath with ADL's;Hypertension;Lipids     Review  CherrNiyanalready starting to lose weight and feeling good despite not eating as well while out of town.  Blood pressures have been good and she usually checks them at home.  Meds are good.  SOB in morning but it does improve as day goes by.  CherrEvaleneoing well overall.  Her  weight is holding steady and not moving as she is not getting in her exercise in on off days.  Her pressures have been running low with low fluid levels. She has been a little lightheaded and slightly dehydrated and we talked about getting in enough water.  Her breathing has gotten worse the last couple of days and needed to do a breathing treatment to help.  CherrCheetaralost almost 20 lb since her event.  She is glad to get it coming down again.  Her SOB has improved and feeling pretty good and has not needed her nebulizer.  Blood pressures are doing good overall.  She is back to drinking more water.     Expected Outcomes  Short: Continue to work SOB and weight loss.  Long: Continue to monitor risk factors.  Short: Drink more water.  Long: Continue to work on weight loss.  Short: Continue to work on breathing  Long: Continue to maintain weight loss  Core Components/Risk Factors/Patient Goals at Discharge (Final Review):  Goals and Risk Factor Review - 12/24/19 0755      Core Components/Risk Factors/Patient Goals Review   Personal Goals Review  Weight Management/Obesity;Improve shortness of breath with ADL's;Hypertension;Lipids    Review  Cheral has lost almost 20 lb since her event.  She is glad to get it coming down again.  Her SOB has improved and feeling pretty good and has not needed her nebulizer.  Blood pressures are doing good overall.  She is back to drinking more water.    Expected Outcomes  Short: Continue to work on breathing  Long: Continue to maintain weight loss       ITP Comments: ITP Comments    Row Name 10/10/19 1439 10/11/19 1229 10/15/19 0759 10/24/19 0632 11/08/19 1135   ITP Comments  Completed virtual orientation today.  EP eval scheduled for 2/11 at 11am.  Documentation for diagnosis can be found in CE encounter 10/03/19.  Completed 6MWT and gym orientation.  Initial ITP created and sent for review to Dr. Emily Filbert, Medical Director.  First full day of exercise!  Patient  was oriented to gym and equipment including functions, settings, policies, and procedures.  Patient's individual exercise prescription and treatment plan were reviewed.  All starting workloads were established based on the results of the 6 minute walk test done at initial orientation visit.  The plan for exercise progression was also introduced and progression will be customized based on patient's performance and goals.  30 day chart review completed. ITP sent to Dr Zachery Dakins Medical Director, for review,changes as needed and signature.  Completed Initial RD Eval   Row Name 11/21/19 0635 12/19/19 0550 01/16/20 0615       ITP Comments  30 day chart review completed. ITP sent to Dr Zachery Dakins Medical Director, for review,changes as needed and signature. Continue with ITP if no changes requested  30 Day review completed. Medical Director review done, changes made as directed,and approval shown by signature of Market researcher.  30 Day review completed. ITP review done, changes made as directed,and approval shown by signature of  Scientist, research (life sciences).        Comments:

## 2020-01-16 NOTE — Progress Notes (Signed)
Daily Session Note  Patient Details  Name: Sharon Cervantes MRN: [id number] Date of Birth: August 21, 1951 Referring Provider:     Pulmonary Rehab from 10/11/2019 in [facility] Cardiac and Pulmonary Rehab  Referring Provider  Claudette Stapler MD      Encounter Date: 01/16/2020  Check In: Session Check In - 01/16/20 0831      Check-In   Supervising physician immediately available to respond to emergencies  See telemetry face sheet for immediately available ER MD    Location  [facility]-Cardiac & Pulmonary Rehab    Staff Present  Heath Lark, RN, BSN, Lance Sell, BA, ACSM CEP, Exercise Physiologist;Joseph Tessie Fass RCP,RRT,BSRT    Virtual Visit  No    Medication changes reported      No    Fall or balance concerns reported     No    Warm-up and Cool-down  Performed on first and last piece of equipment    Resistance Training Performed  Yes    VAD Patient?  No    PAD/SET Patient?  No      VAD patient   Has back up controller?  No      Pain Assessment   Currently in Pain?  No/denies          Social History   Tobacco Use  Smoking Status Never Smoker  Smokeless Tobacco Never Used    Goals Met:  Proper associated with RPD/PD & O2 Sat Independence with exercise equipment Exercise tolerated well No report of cardiac concerns or symptoms  Goals Unmet:  Not Applicable  Comments: Pt able to follow exercise prescription today without complaint.  Will continue to monitor for progression.    Dr. Emily Filbert is Medical Director for [place] [name].

## 2020-01-18 ENCOUNTER — Other Ambulatory Visit: Payer: Self-pay

## 2020-01-18 ENCOUNTER — Encounter: Payer: Medicare Other | Admitting: *Deleted

## 2020-01-18 DIAGNOSIS — J4550 Severe persistent asthma, uncomplicated: Secondary | ICD-10-CM

## 2020-01-18 DIAGNOSIS — J45909 Unspecified asthma, uncomplicated: Secondary | ICD-10-CM | POA: Diagnosis not present

## 2020-01-18 DIAGNOSIS — I272 Pulmonary hypertension, unspecified: Secondary | ICD-10-CM

## 2020-01-18 NOTE — Progress Notes (Signed)
Daily Session Note  Patient Details  Name: Sharon Cervantes MRN: [id number] Date of Birth: 02-11-1951 Referring Provider:     Pulmonary Rehab from 10/11/2019 in [facility] Cardiac and Pulmonary Rehab  Referring Provider  Claudette Stapler MD      Encounter Date: 01/18/2020  Check In:      Social History   Tobacco Use  Smoking Status Never Smoker  Smokeless Tobacco Never Used    Goals Met:  Proper associated with RPD/PD & O2 Sat Independence with exercise equipment Exercise tolerated well No report of cardiac concerns or symptoms  Goals Unmet:  Not Applicable  Comments: Pt able to follow exercise prescription today without complaint.  Will continue to monitor for progression.    Dr. Emily Filbert is Medical Director for [place] [name].

## 2020-01-21 ENCOUNTER — Other Ambulatory Visit: Payer: Self-pay | Admitting: Orthopedic Surgery

## 2020-01-21 ENCOUNTER — Other Ambulatory Visit: Payer: Self-pay

## 2020-01-21 ENCOUNTER — Encounter: Payer: Medicare Other | Admitting: *Deleted

## 2020-01-21 DIAGNOSIS — I272 Pulmonary hypertension, unspecified: Secondary | ICD-10-CM

## 2020-01-21 DIAGNOSIS — J45909 Unspecified asthma, uncomplicated: Secondary | ICD-10-CM | POA: Diagnosis not present

## 2020-01-21 DIAGNOSIS — J4550 Severe persistent asthma, uncomplicated: Secondary | ICD-10-CM

## 2020-01-21 NOTE — Patient Instructions (Signed)
Discharge Patient Instructions  Patient Details  Name: Sharon Cervantes MRN: [id number] Date of Birth: 1951-03-21 Referring Provider:  McLean-Scocuzza, Olivia Mackie *   Number of Visits: 22  Reason for Discharge:  Patient reached a stable level of exercise. Patient independent in their exercise. Patient has met program and personal goals.  Smoking History:  Social History   Tobacco Use  Smoking Status Never Smoker  Smokeless Tobacco Never Used    Diagnosis:  Pulmonary HTN ([place])  Severe persistent asthma, unspecified whether complicated  Initial Exercise Prescription: Initial Exercise Prescription - 10/11/19 1200      Date of Initial Exercise RX and Referring Provider   Date  10/11/19    Referring Provider  Claudette Stapler MD      Treadmill   MPH  1.5    Grade  0.5    Minutes  15    METs  2.25      NuStep   Level  1    SPM  80    Minutes  15    METs  2      T5 Nustep   Level  1    SPM  80    Minutes  15    METs  2      Biostep-RELP   Level  1    SPM  50    Minutes  15    METs  2      Prescription Details   Frequency (times per week)  3    Duration  Progress to 30 minutes of continuous aerobic without signs/symptoms of physical distress      Intensity   THRR 40-80% of Max Heartrate  114-139    Ratings of Perceived Exertion  11-13    Perceived Dyspnea  0-4      Progression   Progression  Continue to progress workloads to maintain intensity without signs/symptoms of physical distress.      Resistance Training   Training Prescription  Yes    Weight  3 lb    Reps  10-15       Discharge Exercise Prescription (Final Exercise Prescription Changes): Exercise Prescription Changes - 01/07/20 1700      Response to Exercise   Blood Pressure (Admit)  126/74    Blood Pressure (Exercise)  130/60    Blood Pressure (Exit)  126/74    Heart Rate (Admit)  74 bpm    Heart Rate (Exercise)  104 bpm    Heart Rate (Exit)  78 bpm    Oxygen Saturation (Admit)  93 %     Oxygen Saturation (Exercise)  93 %    Oxygen Saturation (Exit)  96 %    Rating of Perceived Exertion (Exercise)  15    Perceived Dyspnea (Exercise)  3    Symptoms  none    Duration  Continue with 30 min of aerobic exercise without signs/symptoms of physical distress.    Intensity  THRR unchanged      Progression   Progression  Continue to progress workloads to maintain intensity without signs/symptoms of physical distress.    Average METs  3.2      Resistance Training   Training Prescription  Yes    Weight  6 lb    Reps  10-15      Interval Training   Interval Training  No      Treadmill   MPH  2.3    Grade  0    Minutes  15    METs  2.76      T5 Nustep   Level  6    SPM  80    Minutes  15    METs  3.6      Home Exercise Plan   Plans to continue exercise at  [employer] (comment)   walking, gym, [employer]   Frequency  Add 2 additional days to program exercise sessions.    Initial Home Exercises Provided  10/23/19       Functional Capacity: 6 Minute Walk    Row Name 10/11/19 1237 01/09/20 0827       6 Minute Walk   Phase  Initial  Discharge    Distance  910 feet  1500 feet    Walk Time  6 minutes  6 minutes    # of Rest Breaks  0  0    MPH  1.72  2.84    METS  1.73  3.06    RPE  12  12    Perceived Dyspnea   2  2    VO2 Peak  6.06  10.7    Symptoms  Yes (comment)  Yes (comment)    Comments  SOB  SOB    Resting HR  89 bpm  74 bpm    Resting BP  124/74  136/74    Resting Oxygen Saturation   97 %  96 %    Exercise Oxygen Saturation  during 6 min walk  93 %  93 %    Max Ex. HR  122 bpm  120 bpm    Max Ex. BP  148/76  166/74    2 Minute Post BP  130/72  136/70      Interval HR   1 Minute HR  107  --    2 Minute HR  113  88    3 Minute HR  119  --    4 Minute HR  121  --    5 Minute HR  122  --    6 Minute HR  117  120    2 Minute Post HR  97  91    Interval Heart Rate?  Yes  Yes pulse oximeter did not register pulse consistently      Interval  Oxygen   Interval Oxygen?  Yes  Yes    Baseline Oxygen Saturation %  97 %  96 %    1 Minute Oxygen Saturation %  98 %  96 % Room Air    1 Minute Liters of Oxygen  0 L Room Air  0 L    2 Minute Oxygen Saturation %  94 %  93 %    2 Minute Liters of Oxygen  0 L  0 L    3 Minute Oxygen Saturation %  93 %  94 %    3 Minute Liters of Oxygen  0 L  0 L    4 Minute Oxygen Saturation %  94 %  94 %    4 Minute Liters of Oxygen  0 L  0 L    5 Minute Oxygen Saturation %  93 %  94 %    5 Minute Liters of Oxygen  0 L  0 L    6 Minute Oxygen Saturation %  94 %  93 %    6 Minute Liters of Oxygen  0 L  0 L    2 Minute Post Oxygen Saturation %  99 %  98 %  2 Minute Post Liters of Oxygen  0 L  0 L       Quality of Life:   Personal Goals: Goals established at orientation with interventions provided to work toward goal. Personal Goals and Risk Factors at Admission - 10/11/19 1242      Core Components/Risk Factors/Patient Goals on Admission    Weight Management  Yes;Obesity;Weight Loss    Intervention  Weight Management: Develop a combined nutrition and exercise program designed to reach desired caloric intake, while maintaining appropriate intake of nutrient and fiber, sodium and fats, and appropriate energy expenditure required for the weight goal.;Weight Management/Obesity: Establish reasonable short term and long term weight goals.;Weight Management: Provide education and appropriate resources to help participant work on and attain dietary goals.;Obesity: Provide education and appropriate resources to help participant work on and attain dietary goals.    Admit Weight  273 lb 14.4 oz (124.2 kg)    Goal Weight: Short Term  268 lb (121.6 kg)    Goal Weight: Long Term  260 lb (117.9 kg)    Expected Outcomes  Short Term: Continue to assess and modify interventions until short term weight is achieved;Long Term: Adherence to nutrition and physical activity/exercise program aimed toward attainment of  established weight goal;Weight Loss: Understanding of general recommendations for a balanced deficit meal plan, which promotes 1-2 lb weight loss per week and includes a negative energy balance of [phone] kcal/d;Understanding recommendations for meals to include 15-35% energy as protein, 25-35% energy from fat, 35-60% energy from carbohydrates, less than 263m of dietary cholesterol, 20-35 gm of total fiber daily;Understanding of distribution of calorie intake throughout the day with the consumption of 4-5 meals/snacks    Improve shortness of breath with ADL's  Yes    Intervention  Provide education, individualized exercise plan and daily activity instruction to help decrease symptoms of SOB with activities of daily living.    Expected Outcomes  Short Term: Improve cardiorespiratory fitness to achieve a reduction of symptoms when performing ADLs;Long Term: Be able to perform more ADLs without symptoms or delay the onset of symptoms    Hypertension  Yes    Intervention  Provide education on lifestyle modifcations including regular physical activity/exercise, weight management, moderate sodium restriction and increased consumption of fresh fruit, vegetables, and low fat dairy, alcohol moderation, and smoking cessation.;Monitor prescription use compliance.    Expected Outcomes  Long Term: Maintenance of blood pressure at goal levels.;Short Term: Continued assessment and intervention until BP is < 140/927mHG in hypertensive participants. < 130/8038mG in hypertensive participants with diabetes, heart failure or chronic kidney disease.    Lipids  Yes    Intervention  Provide education and support for participant on nutrition & aerobic/resistive exercise along with prescribed medications to achieve LDL <58m81mDL >40mg1m Expected Outcomes  Short Term: Participant states understanding of desired cholesterol values and is compliant with medications prescribed. Participant is following exercise prescription and  nutrition guidelines.;Long Term: Cholesterol controlled with medications as prescribed, with individualized exercise RX and with personalized nutrition plan. Value goals: LDL < 58mg,56m > 40 mg.        Personal Goals Discharge: Goals and Risk Factor Review - 01/18/20 0842      Core Components/Risk Factors/Patient Goals Review   Personal Goals Review  Weight Management/Obesity;Improve shortness of breath with ADL's;Hypertension;Lipids    Review  CherryThereseanues to lose weight and has made that part of her journey she plans to continue with after graduation. Her breathing  has improved and she does note that it is better when she exercises regularly.  She plans to continue to keep eye on blood pressures and drink more water.    Expected Outcomes  Continue to work on weight loss and monitor risk factors.       Exercise Goals and Review: Exercise Goals    Row Name 10/11/19 1241             Exercise Goals   Increase Physical Activity  Yes       Intervention  Provide advice, education, support and counseling about physical activity/exercise needs.;Develop an individualized exercise prescription for aerobic and resistive training based on initial evaluation findings, risk stratification, comorbidities and participant's personal goals.       Expected Outcomes  Short Term: Attend rehab on a regular basis to increase amount of physical activity.;Long Term: Add in home exercise to make exercise part of routine and to increase amount of physical activity.;Long Term: Exercising regularly at least 3-5 days a week.       Increase Strength and Stamina  Yes       Intervention  Provide advice, education, support and counseling about physical activity/exercise needs.;Develop an individualized exercise prescription for aerobic and resistive training based on initial evaluation findings, risk stratification, comorbidities and participant's personal goals.       Expected Outcomes  Short Term: Increase  workloads from initial exercise prescription for resistance, speed, and METs.;Short Term: Perform resistance training exercises routinely during rehab and add in resistance training at home;Long Term: Improve cardiorespiratory fitness, muscular endurance and strength as measured by increased METs and functional capacity (6MWT)       Able to understand and use rate of perceived exertion (RPE) scale  Yes       Intervention  Provide education and explanation on how to use RPE scale       Expected Outcomes  Short Term: Able to use RPE daily in rehab to express subjective intensity level;Long Term:  Able to use RPE to guide intensity level when exercising independently       Able to understand and use Dyspnea scale  Yes       Intervention  Provide education and explanation on how to use Dyspnea scale       Expected Outcomes  Short Term: Able to use Dyspnea scale daily in rehab to express subjective sense of shortness of breath during exertion;Long Term: Able to use Dyspnea scale to guide intensity level when exercising independently       Knowledge and understanding of Target Heart Rate Range (THRR)  Yes       Intervention  Provide education and explanation of THRR including how the numbers were predicted and where they are located for reference       Expected Outcomes  Short Term: Able to state/look up THRR;Long Term: Able to use THRR to govern intensity when exercising independently;Short Term: Able to use daily as guideline for intensity in rehab       Able to check pulse independently  Yes       Intervention  Provide education and demonstration on how to check pulse in carotid and radial arteries.;Review the importance of being able to check your own pulse for safety during independent exercise       Expected Outcomes  Short Term: Able to explain why pulse checking is important during independent exercise;Long Term: Able to check pulse independently and accurately       Understanding of Exercise  Prescription  Yes       Intervention  Provide education, explanation, and written materials on patient's individual exercise prescription       Expected Outcomes  Short Term: Able to explain program exercise prescription;Long Term: Able to explain home exercise prescription to exercise independently          Exercise Goals Re-Evaluation: Exercise Goals Re-Evaluation    Row Name 10/15/19 0815 10/23/19 0804 10/29/19 0809 10/31/19 1415 11/15/19 0857     Exercise Goal Re-Evaluation   Exercise Goals Review  Able to understand and use Dyspnea scale;Able to understand and use rate of perceived exertion (RPE) scale;Knowledge and understanding of Target Heart Rate Range (THRR);Understanding of Exercise Prescription  Able to understand and use Dyspnea scale;Able to understand and use rate of perceived exertion (RPE) scale;Knowledge and understanding of Target Heart Rate Range (THRR);Understanding of Exercise Prescription;Increase Physical Activity;Increase Strength and Stamina;Able to check pulse independently  Increase Physical Activity;Increase Strength and Stamina;Understanding of Exercise Prescription  Increase Physical Activity;Increase Strength and Stamina;Understanding of Exercise Prescription  Increase Physical Activity;Increase Strength and Stamina;Able to understand and use rate of perceived exertion (RPE) scale;Able to understand and use Dyspnea scale;Knowledge and understanding of Target Heart Rate Range (THRR);Able to check pulse independently;Understanding of Exercise Prescription   Comments  Reviewed RPE scale, THR and program prescription with pt today.  Pt voiced understanding and was given a copy of goals to take home.  Reviewed home exercise with pt today.  Pt plans to walk, go to [facility], and use videos for exercise.  She is planning to get a pulse oximeter from [employer]. Reviewed THR, pulse, RPE, sign and symptoms, and when to call 911 or MD.  Also discussed weather considerations and indoor  options.  Pt voiced understanding.  Yides is off to good start.  She is already getting in her weights at house and going to gym at apartment complex.  She has gotten her pulse oximeter to monitor herself on treadmill at home.  Polette has been doing well in rehab. She is up to level 4 on the BioStep.  We will continue to monitor her progress.  Merriel has movd up to level 6 on NS and Biostep.  She is up to 5 lb for strength work.   Expected Outcomes  Short: Use RPE daily to regulate intensity. Long: Follow program prescription in THR.  Short: Start to add in exercise at home.  Long: Continue to improve stamina.  Short: Continue to exercise on off days.  Long; Continue to attend regularly.  Short: Increase workload on NuStep.  Long: Continue to exercise on off days.  Short : continue to exercise consistently Long:  increase MET level   Row Name 11/27/19 1542 12/10/19 0743 12/14/19 0816 12/24/19 0753 01/07/20 1723     Exercise Goal Re-Evaluation   Exercise Goals Review  Increase Physical Activity;Increase Strength and Stamina;Understanding of Exercise Prescription  Increase Physical Activity;Increase Strength and Stamina;Understanding of Exercise Prescription  Increase Physical Activity;Increase Strength and Stamina;Able to understand and use rate of perceived exertion (RPE) scale;Able to understand and use Dyspnea scale;Knowledge and understanding of Target Heart Rate Range (THRR);Able to check pulse independently;Understanding of Exercise Prescription  Increase Physical Activity;Increase Strength and Stamina;Understanding of Exercise Prescription  Increase Physical Activity;Increase Strength and Stamina;Able to understand and use rate of perceived exertion (RPE) scale;Able to understand and use Dyspnea scale;Knowledge and understanding of Target Heart Rate Range (THRR);Able to check pulse independently;Understanding of Exercise Prescription   Comments  Alvita has been doing  well in rehab. She was out on  vacation but came extra days to make up her time. She was also planning to walk while she was gone.  She is now on level 7 for both NuStep and BioStep.  We will continue to monitor her progress.  Larysa is doing well in rehab.  She admits that she could do better with her home exercise.  Her knee has been bothering her at home and she won't go walking.  She is supposed to go back to doctor to set up surgery.  She normally goes to apartment gym to walk on treadmill, but has been consistent with her weights.  We talked about adding in the staff videos for the chair exercises for her knee days.  Jeanenne works at [employer] and has increased to 6 lb weights.  Staff will monitor progress.  Sherlyne is doing well in rehab.  She is walking at home on her off days for about 20 min on hilly terrain.  She is getting her strength and stamina back!  Najae attends consistently and works at [employer].  Staff will monitor progress.   Expected Outcomes  Short: Continue to improve workloads. Long: Continue to improve stamina.  Short: Add in chair exercises  Long: Continue to improve strength for surgery.  Short:  continue consistent exercise Long:  build overall stamina  Short: Continue to exercise on her off days  Long: Continue to improve stamina.  Short: continue to exercise on her own Long: increase MET level   Row Name 01/18/20 0809             Exercise Goal Re-Evaluation   Exercise Goals Review  Increase Physical Activity;Increase Strength and Stamina;Understanding of Exercise Prescription       Comments  Zephaniah is graduating next week.  She is headed off to a wedding in [place] and then getting ready for her knee surgery.  She plans to continue to go to gym at her complex to maintain her exercise.       Expected Outcomes  Continue to exercise on her own          Nutrition & Weight - Outcomes: Pre Biometrics - 10/11/19 1241      Pre Biometrics   Height  5' 6.75" (1.695 m)    Weight  273 lb 14.4 oz (124.2  kg)    BMI (Calculated)  43.24    Single Leg Stand  6.76 seconds      Post Biometrics - 01/09/20 0844       Post  Biometrics   Height  5' 6.75" (1.695 m)    Weight  256 lb 6.4 oz (116.3 kg)    BMI (Calculated)  40.48    Single Leg Stand  10.7 seconds       Nutrition: Nutrition Therapy & Goals - 11/08/19 1009      Nutrition Therapy   Diet  HH, Low Na    Protein (specify units)  100-105g    Fiber  25 grams    Whole Grain Foods  3 servings    Saturated Fats  12 max. grams    Fruits and Vegetables  5 servings/day    Sodium  1.5 grams      Personal Nutrition Goals   Nutrition Goal  ST: repleace honey wheat with 100% wheat LT: eating better, weight loss    Comments  B instant grits, egg (scrambled) with bacon, peanut butter with natures own honey wheat and skippy's honey. tea -  diet tea or coffee - cream, stevia. L: sandwich (pb or cheese or ham) D: salad (chicken caesar salad packet) with walnuts, bacon bits, extra cheese, seasame sticks. S: a few ritz crackers or kettle corn (rice cakes). Drinks: diet tea. trying to add more water (will usually add crystal lite). Pt reports not feeling hungry. energy improved since starting rehab 6-7/10 (was 0/10). Discussed HH eating.      Intervention Plan   Intervention  Prescribe, educate and counsel regarding individualized specific dietary modifications aiming towards targeted core components such as weight, hypertension, lipid management, diabetes, heart failure and other comorbidities.;Nutrition handout(s) given to patient.    Expected Outcomes  Short Term Goal: Understand basic principles of dietary content, such as calories, fat, sodium, cholesterol and nutrients.;Short Term Goal: A plan has been developed with personal nutrition goals set during dietitian appointment.;Long Term Goal: Adherence to prescribed nutrition plan.       Nutrition Discharge: Nutrition Assessments - 01/16/20 0937      MEDFICTS Scores   Pre Score  29    Post  Score  26    Score Difference  -3       Education Questionnaire Score: Knowledge Questionnaire Score - 01/16/20 0936      Knowledge Questionnaire Score   Pre Score  17/18 Education: O2 saturations    Post Score  16/18       Goals reviewed with patient; copy given to patient.

## 2020-01-21 NOTE — Progress Notes (Signed)
Daily Session Note  Patient Details  Name: Sharon Cervantes MRN: [id number] Date of Birth: Oct 28, 1950 Referring Provider:     Pulmonary Rehab from 10/11/2019 in [facility] Cardiac and Pulmonary Rehab  Referring Provider  Claudette Stapler MD      Encounter Date: 01/21/2020  Check In: Session Check In - 01/21/20 6394      Check-In   Supervising physician immediately available to respond to emergencies  See telemetry face sheet for immediately available ER MD    Location  [facility]-Cardiac & Pulmonary Rehab    Staff Present  Heath Lark, RN, BSN, Laveda Norman, BS, ACSM CEP, Exercise Physiologist;Joseph Tessie Fass RCP,RRT,BSRT    Virtual Visit  No    Medication changes reported      No    Fall or balance concerns reported     No    Warm-up and Cool-down  Performed on first and last piece of equipment    Resistance Training Performed  Yes    VAD Patient?  No    PAD/SET Patient?  No      Pain Assessment   Currently in Pain?  No/denies          Social History   Tobacco Use  Smoking Status Never Smoker  Smokeless Tobacco Never Used    Goals Met:  Proper associated with RPD/PD & O2 Sat Independence with exercise equipment Exercise tolerated well No report of cardiac concerns or symptoms  Goals Unmet:  Not Applicable  Comments: Pt able to follow exercise prescription today without complaint.  Will continue to monitor for progression.    Dr. Emily Filbert is Medical Director for [name] and [name].

## 2020-01-23 ENCOUNTER — Other Ambulatory Visit: Payer: Self-pay

## 2020-01-23 ENCOUNTER — Encounter: Payer: Medicare Other | Admitting: *Deleted

## 2020-01-23 DIAGNOSIS — I272 Pulmonary hypertension, unspecified: Secondary | ICD-10-CM

## 2020-01-23 DIAGNOSIS — J4550 Severe persistent asthma, uncomplicated: Secondary | ICD-10-CM

## 2020-01-23 DIAGNOSIS — J45909 Unspecified asthma, uncomplicated: Secondary | ICD-10-CM | POA: Diagnosis not present

## 2020-01-23 NOTE — Progress Notes (Signed)
Daily Session Note  Patient Details  Name: Sharon Cervantes MRN: [id number] Date of Birth: [birth year] Referring Provider:     Pulmonary Rehab from 10/11/2019 in [facility] Cardiac and Pulmonary Rehab  Referring Provider  Claudette Stapler MD      Encounter Date: 01/23/2020  Check In: Session Check In - 01/23/20 0826      Check-In   Supervising physician immediately available to respond to emergencies  See telemetry face sheet for immediately available ER MD    Location  [facility]-Cardiac & Pulmonary Rehab    Staff Present  Heath Lark, RN, BSN, Lance Sell, BA, ACSM CEP, Exercise Physiologist;Joseph Hood RCP,RRT,BSRT    Virtual Visit  No    Medication changes reported      No    Fall or balance concerns reported     No    Warm-up and Cool-down  Performed on first and last piece of equipment    Resistance Training Performed  Yes    VAD Patient?  No    PAD/SET Patient?  No      Pain Assessment   Currently in Pain?  No/denies          Social History   Tobacco Use  Smoking Status Never Smoker  Smokeless Tobacco Never Used    Goals Met:  Proper associated with RPD/PD & O2 Sat Independence with exercise equipment Exercise tolerated well Personal goals reviewed No report of cardiac concerns or symptoms  Goals Unmet:  Not Applicable  Comments:  Sharon Cervantes graduated today from  rehab with 35 sessions completed.  Details of the patient's exercise prescription and what She needs to do in order to continue the prescription and progress were discussed with patient.  Patient was given a copy of prescription and goals.  Patient verbalized understanding.  Sharon Cervantes plans to continue to exercise by eventually joining the [facility], she has knee surgery and PT following the surgery first. .    Dr. Emily Filbert is Medical Director for [place] and [name].

## 2020-01-23 NOTE — Progress Notes (Signed)
Discharge Progress Report  Patient Details  Name: Sharon Cervantes MRN: [id number] Date of Birth: June 24, 1951 Referring Provider:     Pulmonary Rehab from 10/11/2019 in [facility]  Referring Provider  Claudette Stapler MD       Number of Visits: 35 Reason for Discharge:  Patient reached a stable level of exercise. Patient independent in their exercise. Patient has met program and personal goals.  Smoking History:  Social History   Tobacco Use  Smoking Status Never Smoker  Smokeless Tobacco Never Used    Diagnosis:  Pulmonary HTN ([place])  Severe persistent asthma, unspecified whether complicated  ADL [facility]: Pulmonary Assessment Scores    Row Name 10/11/19 1243 01/09/20 1539 01/16/20 0854     ADL [facility]   ADL Phase  Entry  Exit  Exit   SOB Score total  60  --  29   Rest  1  --  0   Walk  4  --  3   Stairs  4  --  5   Bath  1  --  0   Dress  1  --  0   Shop  4  --  2     CAT Score   CAT Score  25  --  15     mMRC Score   mMRC Score  '4  1  1      ' Initial Exercise Prescription: Initial Exercise Prescription - 10/11/19 1200      Date of Initial Exercise RX and Referring Provider   Date  10/11/19    Referring Provider  Claudette Stapler MD      Treadmill   MPH  1.5    Grade  0.5    Minutes  15    METs  2.25      NuStep   Level  1    SPM  80    Minutes  15    METs  2      T5 Nustep   Level  1    SPM  80    Minutes  15    METs  2      Biostep-RELP   Level  1    SPM  50    Minutes  15    METs  2      Prescription Details   Frequency (times per week)  3    Duration  Progress to 30 minutes of continuous aerobic without signs/symptoms of physical distress      Intensity   THRR 40-80% of Max Heartrate  114-139    Ratings of Perceived Exertion  11-13    Perceived Dyspnea  0-4      Progression   Progression  Continue to progress workloads to maintain intensity without signs/symptoms of physical distress.      Resistance Training    Training Prescription  Yes    Weight  3 lb    Reps  10-15       Discharge Exercise Prescription (Final Exercise Prescription Changes): Exercise Prescription Changes - 01/07/20 1700      Response to Exercise   Blood Pressure (Admit)  126/74    Blood Pressure (Exercise)  130/60    Blood Pressure (Exit)  126/74    Heart Rate (Admit)  74 bpm    Heart Rate (Exercise)  104 bpm    Heart Rate (Exit)  78 bpm    Oxygen Saturation (Admit)  93 %    Oxygen Saturation (Exercise)  93 %    Oxygen Saturation (Exit)  96 %    Rating of Perceived Exertion (Exercise)  15    Perceived Dyspnea (Exercise)  3    Symptoms  none    Duration  Continue with 30 min of aerobic exercise without signs/symptoms of physical distress.    Intensity  THRR unchanged      Progression   Progression  Continue to progress workloads to maintain intensity without signs/symptoms of physical distress.    Average METs  3.2      Resistance Training   Training Prescription  Yes    Weight  6 lb    Reps  10-15      Interval Training   Interval Training  No      Treadmill   MPH  2.3    Grade  0    Minutes  15    METs  2.76      T5 Nustep   Level  6    SPM  80    Minutes  15    METs  3.6      Home Exercise Plan   Plans to continue exercise at  [employer] (comment)   walking, gym, [employer]   Frequency  Add 2 additional days to program exercise sessions.    Initial Home Exercises Provided  10/23/19       Functional Capacity: 6 Minute Walk    Row Name 10/11/19 1237 01/09/20 0827       6 Minute Walk   Phase  Initial  Discharge    Distance  910 feet  1500 feet    Walk Time  6 minutes  6 minutes    # of Rest Breaks  0  0    MPH  1.72  2.84    METS  1.73  3.06    RPE  12  12    Perceived Dyspnea   2  2    VO2 Peak  6.06  10.7    Symptoms  Yes (comment)  Yes (comment)    Comments  SOB  SOB    Resting HR  89 bpm  74 bpm    Resting BP  124/74  136/74    Resting Oxygen Saturation   97 %  96 %     Exercise Oxygen Saturation  during 6 min walk  93 %  93 %    Max Ex. HR  122 bpm  120 bpm    Max Ex. BP  148/76  166/74    2 Minute Post BP  130/72  136/70      Interval HR   1 Minute HR  107  --    2 Minute HR  113  88    3 Minute HR  119  --    4 Minute HR  121  --    5 Minute HR  122  --    6 Minute HR  117  120    2 Minute Post HR  97  91    Interval Heart Rate?  Yes  Yes pulse oximeter did not register pulse consistently      Interval Oxygen   Interval Oxygen?  Yes  Yes    Baseline Oxygen Saturation %  97 %  96 %    1 Minute Oxygen Saturation %  98 %  96 % Room Air    1 Minute Liters of Oxygen  0 L Room Air  0 L  2 Minute Oxygen Saturation %  94 %  93 %    2 Minute Liters of Oxygen  0 L  0 L    3 Minute Oxygen Saturation %  93 %  94 %    3 Minute Liters of Oxygen  0 L  0 L    4 Minute Oxygen Saturation %  94 %  94 %    4 Minute Liters of Oxygen  0 L  0 L    5 Minute Oxygen Saturation %  93 %  94 %    5 Minute Liters of Oxygen  0 L  0 L    6 Minute Oxygen Saturation %  94 %  93 %    6 Minute Liters of Oxygen  0 L  0 L    2 Minute Post Oxygen Saturation %  99 %  98 %    2 Minute Post Liters of Oxygen  0 L  0 L       Psychological, QOL, Others - Outcomes: PHQ 2/9: Depression screen [facility] 2/9 12/24/2019 12/10/2019 10/29/2019 10/11/2019 05/21/2019  Decreased Interest 0 2 0 1 0  Down, Depressed, Hopeless 0 3 0 1 0  PHQ - 2 Score 0 5 0 2 0  Altered sleeping 2 2 0 3 0  Tired, decreased energy 1 2 0 3 0  Change in appetite 0 0 0 3 0  Feeling bad or failure about yourself  0 1 0 1 0  Trouble concentrating 1 1 0 1 0  Moving slowly or fidgety/restless 0 0 0 0 0  Suicidal thoughts 0 0 0 0 0  PHQ-9 Score 4 11 0 13 0  Difficult doing work/chores Not difficult at all Somewhat difficult Not difficult at all Somewhat difficult Not difficult at all    Quality of Life:    Post Biometrics - 01/09/20 0844       Post  Biometrics   Height  5' 6.75" (1.695 m)    Weight  256 lb 6.4  oz (116.3 kg)    BMI (Calculated)  40.48    Single Leg Stand  10.7 seconds       Nutrition Discharge: Nutrition Assessments - 01/16/20 0937      MEDFICTS Scores   Pre Score  29    Post Score  26    Score Difference  -3       Education Questionnaire Score: Knowledge Questionnaire Score - 01/16/20 0936      Knowledge Questionnaire Score   Pre Score  17/18 Education: O2 saturations    Post Score  16/18       Goals reviewed with patient; copy given to patient.

## 2020-01-23 NOTE — Progress Notes (Signed)
Pulmonary Individual Treatment Plan  Patient Details  Name: Sharon Cervantes MRN: [id number] Date of Birth: 12-Mar-1951 Referring Provider:     Pulmonary Rehab from 10/11/2019 in [facility] Cardiac and Pulmonary Rehab  Referring Provider  Claudette Stapler MD      Initial Encounter Date:    Pulmonary Rehab from 10/11/2019 in [facility] Cardiac and Pulmonary Rehab  Date  10/11/19      Visit Diagnosis: Pulmonary HTN ([place])  Severe persistent asthma, unspecified whether complicated  Patient's Home Medications on Admission:  Current Outpatient Medications:  .  albuterol (PROVENTIL HFA;VENTOLIN HFA) 108 (90 Base) MCG/ACT inhaler, Inhale 1-2 puffs into the lungs every 6 (six) hours as needed for wheezing or shortness of breath. , Disp: , Rfl:  .  budesonide (PULMICORT) 0.5 MG/2ML nebulizer solution, Take 0.5 mg by nebulization every 6 (six) hours as needed (shortness of breath or wheezing). , Disp: , Rfl:  .  cyanocobalamin (,VITAMIN B-12,) 1000 MCG/ML injection, Inject 1,000 mcg into the muscle every 30 (thirty) days., Disp: , Rfl:  .  cyclobenzaprine (FLEXERIL) 10 MG tablet, Take 1 tablet (10 mg total) by mouth 2 (two) times daily as needed for muscle spasms. (Patient not taking: Reported on 01/22/2020), Disp: 20 tablet, Rfl: 0 .  Dupilumab (DUPIXENT) 300 MG/2ML SOPN, Inject 300 mg into the skin every 14 (fourteen) days. , Disp: , Rfl:  .  fluticasone (FLONASE) 50 MCG/ACT nasal spray, Place into the nose., Disp: , Rfl:  .  fluticasone (FLOVENT HFA) 220 MCG/ACT inhaler, Inhale into the lungs., Disp: , Rfl:  .  hydrochlorothiazide (HYDRODIURIL) 12.5 MG tablet, Take 12.5 mg by mouth daily. , Disp: , Rfl:  .  ibuprofen (ADVIL) 800 MG tablet, Take 1 tablet (800 mg total) by mouth every 8 (eight) hours as needed. (Patient not taking: Reported on 01/22/2020), Disp: 21 tablet, Rfl: 0 .  montelukast (SINGULAIR) 10 MG tablet, Take 1 tablet (10 mg total) by mouth at bedtime. appt further refills (Patient taking differently:  Take 10 mg by mouth at bedtime. ), Disp: 90 tablet, Rfl: 1 .  omeprazole (PRILOSEC) 40 MG capsule, Take 40 mg by mouth at bedtime., Disp: , Rfl:  .  pramipexole (MIRAPEX) 1.5 MG tablet, Take 1.5 mg by mouth daily. , Disp: , Rfl:  .  venlafaxine XR (EFFEXOR-XR) 150 MG 24 hr capsule, Take 1 capsule (150 mg total) by mouth daily with breakfast. (Patient not taking: Reported on 01/22/2020), Disp: 90 capsule, Rfl: 3 .  venlafaxine XR (EFFEXOR-XR) 75 MG 24 hr capsule, Take 225 mg by mouth daily with breakfast., Disp: , Rfl:  No current facility-administered medications for this visit.  Facility-Administered Medications Ordered in Other Visits:  .  [COMPLETED] sodium chloride 0.9 % nebulizer solution 3 mL, 3 mL, Nebulization, Once, 3 mL at 10/03/18 1130 **FOLLOWED BY** [COMPLETED] methacholine (PROVOCHOLINE) inhaler solution 0.125 mg, 2 mL, Inhalation, Once, 0.125 mg at 10/03/18 1139 **FOLLOWED BY** methacholine (PROVOCHOLINE) inhaler solution 0.5 mg, 2 mL, Inhalation, Once **FOLLOWED BY** methacholine (PROVOCHOLINE) inhaler solution 2 mg, 2 mL, Inhalation, Once **FOLLOWED BY** methacholine (PROVOCHOLINE) inhaler solution 8 mg, 2 mL, Inhalation, Once **FOLLOWED BY** methacholine (PROVOCHOLINE) inhaler solution 32 mg, 2 mL, Inhalation, Once **FOLLOWED BY** [COMPLETED] albuterol (PROVENTIL) (2.5 MG/3ML) 0.083% nebulizer solution 2.5 mg, 2.5 mg, Nebulization, Once, Sharon Glazier, MD, 2.5 mg at 10/03/18 1146  Past Medical History: Past Medical History:  Diagnosis Date  . Arthritis   . Asthma   . Chronic cough   . Colon polyps   . Depression   .  GERD (gastroesophageal reflux disease)   . Hiatal hernia   . History of chicken pox   . Macular degeneration   . Multinodular goiter    FNA in past neg h/o thyroid cysts  . Pernicious anemia   . RLS (restless legs syndrome)     Tobacco Use: Social History   Tobacco Use  Smoking Status Never Smoker  Smokeless Tobacco Never Used    Labs: Recent Review  Scientist, physiological    Labs for ITP Cardiac and Pulmonary Rehab Latest Ref Rng & Units 10/12/2018   Hemoglobin A1c 4.6 - 6.5 % 5.6       Pulmonary Assessment Scores: Pulmonary Assessment Scores    Row Name 10/11/19 1243 01/09/20 1539 01/16/20 0854     ADL UCSD   ADL Phase  Entry  Exit  Exit   SOB Score total  60  --  29   Rest  1  --  0   Walk  4  --  3   Stairs  4  --  5   Bath  1  --  0   Dress  1  --  0   Shop  4  --  2     CAT Score   CAT Score  25  --  15     mMRC Score   mMRC Score  '4  1  1      ' UCSD: Self-administered rating of dyspnea associated with activities of daily living (ADLs) 6-point scale (0 = "not at all" to 5 = "maximal or unable to do because of breathlessness")  Scoring Scores range from 0 to 120.  Minimally important difference is 5 units  CAT: CAT can identify the health impairment of COPD patients and is better correlated with disease progression.  CAT has a scoring range of zero to 40. The CAT score is classified into four groups of low (less than 10), medium (10 - 20), high (21-30) and very high (31-40) based on the impact level of disease on health status. A CAT score over 10 suggests significant symptoms.  A worsening CAT score could be explained by an exacerbation, poor medication adherence, poor inhaler technique, or progression of COPD or comorbid conditions.  CAT MCID is 2 points  mMRC: mMRC (Modified Medical Research Council) Dyspnea Scale is used to assess the degree of baseline functional disability in patients of respiratory disease due to dyspnea. No minimal important difference is established. A decrease in score of 1 point or greater is considered a positive change.   Pulmonary Function Assessment:   Exercise Target Goals: Exercise Program Goal: Individual exercise prescription set using results from initial 6 min walk test and THRR while considering  patient's activity barriers and safety.   Exercise Prescription Goal: Initial  exercise prescription builds to 30-45 minutes a day of aerobic activity, 2-3 days per week.  Home exercise guidelines will be given to patient during program as part of exercise prescription that the participant will acknowledge.  Education: Aerobic Exercise & Resistance Training: - Gives group verbal and written instruction on the various components of exercise. Focuses on aerobic and resistive training programs and the benefits of this training and how to safely progress through these programs..   Education: Exercise & Equipment Safety: - Individual verbal instruction and demonstration of equipment use and safety with use of the equipment.   Pulmonary Rehab from 10/11/2019 in [facility] Cardiac and Pulmonary Rehab  Date  10/11/19  Educator  [facility]  Instruction Review  Code  1- Verbalizes Understanding      Education: Exercise Physiology & General Exercise Guidelines: - Group verbal and written instruction with models to review the exercise physiology of the cardiovascular system and associated critical values. Provides general exercise guidelines with specific guidelines to those with heart or lung disease.    Education: Flexibility, Balance, Mind/Body Relaxation: Provides group verbal/written instruction on the benefits of flexibility and balance training, including mind/body exercise modes such as yoga, pilates and tai chi.  Demonstration and skill practice provided.   Activity Barriers & Risk Stratification: Activity Barriers & Cardiac Risk Stratification - 10/11/19 1238      Activity Barriers & Cardiac Risk Stratification   Activity Barriers  Deconditioning;Joint Problems;Other (comment);Muscular Weakness;Arthritis;Shortness of Breath    Comments  R knee bone on bone needs surgery once able       6 Minute Walk: 6 Minute Walk    Row Name 10/11/19 1237 01/09/20 0827       6 Minute Walk   Phase  Initial  Discharge    Distance  910 feet  1500 feet    Walk Time  6 minutes  6 minutes     # of Rest Breaks  0  0    MPH  1.72  2.84    METS  1.73  3.06    RPE  12  12    Perceived Dyspnea   2  2    VO2 Peak  6.06  10.7    Symptoms  Yes (comment)  Yes (comment)    Comments  SOB  SOB    Resting HR  89 bpm  74 bpm    Resting BP  124/74  136/74    Resting Oxygen Saturation   97 %  96 %    Exercise Oxygen Saturation  during 6 min walk  93 %  93 %    Max Ex. HR  122 bpm  120 bpm    Max Ex. BP  148/76  166/74    2 Minute Post BP  130/72  136/70      Interval HR   1 Minute HR  107  --    2 Minute HR  113  88    3 Minute HR  119  --    4 Minute HR  121  --    5 Minute HR  122  --    6 Minute HR  117  120    2 Minute Post HR  97  91    Interval Heart Rate?  Yes  Yes pulse oximeter did not register pulse consistently      Interval Oxygen   Interval Oxygen?  Yes  Yes    Baseline Oxygen Saturation %  97 %  96 %    1 Minute Oxygen Saturation %  98 %  96 % Room Air    1 Minute Liters of Oxygen  0 L Room Air  0 L    2 Minute Oxygen Saturation %  94 %  93 %    2 Minute Liters of Oxygen  0 L  0 L    3 Minute Oxygen Saturation %  93 %  94 %    3 Minute Liters of Oxygen  0 L  0 L    4 Minute Oxygen Saturation %  94 %  94 %    4 Minute Liters of Oxygen  0 L  0 L    5 Minute Oxygen Saturation %  93 %  94 %    5 Minute Liters of Oxygen  0 L  0 L    6 Minute Oxygen Saturation %  94 %  93 %    6 Minute Liters of Oxygen  0 L  0 L    2 Minute Post Oxygen Saturation %  99 %  98 %    2 Minute Post Liters of Oxygen  0 L  0 L      Oxygen Initial Assessment: Oxygen Initial Assessment - 10/10/19 1418      Home Oxygen   Home Oxygen Device  None    Sleep Oxygen Prescription  None   was supposed to use CPAP but sent back as it was not working with asthma   Home Exercise Oxygen Prescription  None    Home at Rest Exercise Oxygen Prescription  None      Initial 6 min Walk   Oxygen Used  None      Program Oxygen Prescription   Program Oxygen Prescription  None      Intervention    Short Term Goals  To learn and understand importance of monitoring SPO2 with pulse oximeter and demonstrate accurate use of the pulse oximeter.;To learn and understand importance of maintaining oxygen saturations>88%;To learn and demonstrate proper pursed lip breathing techniques or other breathing techniques.;To learn and demonstrate proper use of respiratory medications    Long  Term Goals  Verbalizes importance of monitoring SPO2 with pulse oximeter and return demonstration;Maintenance of O2 saturations>88%;Exhibits proper breathing techniques, such as pursed lip breathing or other method taught during program session;Compliance with respiratory medication;Demonstrates proper use of MDI's       Oxygen Re-Evaluation: Oxygen Re-Evaluation    Row Name 10/15/19 0817 10/29/19 0814 12/10/19 0752 12/24/19 0757 01/18/20 0844     Program Oxygen Prescription   Program Oxygen Prescription  None  None  None  None  None     Home Oxygen   Home Oxygen Device  None  None  None  None  None   Sleep Oxygen Prescription  None  None  None  None  None   Home Exercise Oxygen Prescription  None  None  None  None  None   Home at Rest Exercise Oxygen Prescription  None  None  None  None  None   Compliance with Home Oxygen Use  -- NA  --  --  --  --     Goals/Expected Outcomes   Short Term Goals  To learn and understand importance of monitoring SPO2 with pulse oximeter and demonstrate accurate use of the pulse oximeter.;To learn and understand importance of maintaining oxygen saturations>88%;To learn and demonstrate proper pursed lip breathing techniques or other breathing techniques.  To learn and understand importance of monitoring SPO2 with pulse oximeter and demonstrate accurate use of the pulse oximeter.;To learn and understand importance of maintaining oxygen saturations>88%;To learn and demonstrate proper pursed lip breathing techniques or other breathing techniques.  To learn and understand importance of  monitoring SPO2 with pulse oximeter and demonstrate accurate use of the pulse oximeter.;To learn and understand importance of maintaining oxygen saturations>88%;To learn and demonstrate proper pursed lip breathing techniques or other breathing techniques.;To learn and demonstrate proper use of respiratory medications  To learn and understand importance of monitoring SPO2 with pulse oximeter and demonstrate accurate use of the pulse oximeter.;To learn and understand importance of maintaining oxygen saturations>88%;To learn and demonstrate proper pursed lip breathing techniques or other breathing techniques.;To learn and  demonstrate proper use of respiratory medications  To learn and understand importance of monitoring SPO2 with pulse oximeter and demonstrate accurate use of the pulse oximeter.;To learn and understand importance of maintaining oxygen saturations>88%;To learn and demonstrate proper pursed lip breathing techniques or other breathing techniques.;To learn and demonstrate proper use of respiratory medications   Long  Term Goals  Exhibits compliance with exercise, home and travel O2 prescription;Verbalizes importance of monitoring SPO2 with pulse oximeter and return demonstration;Maintenance of O2 saturations>88%;Exhibits proper breathing techniques, such as pursed lip breathing or other method taught during program session  Verbalizes importance of monitoring SPO2 with pulse oximeter and return demonstration;Maintenance of O2 saturations>88%;Exhibits proper breathing techniques, such as pursed lip breathing or other method taught during program session  Verbalizes importance of monitoring SPO2 with pulse oximeter and return demonstration;Maintenance of O2 saturations>88%;Exhibits proper breathing techniques, such as pursed lip breathing or other method taught during program session;Compliance with respiratory medication;Demonstrates proper use of MDI's  Verbalizes importance of monitoring SPO2 with  pulse oximeter and return demonstration;Maintenance of O2 saturations>88%;Exhibits proper breathing techniques, such as pursed lip breathing or other method taught during program session;Compliance with respiratory medication;Demonstrates proper use of MDI's  Verbalizes importance of monitoring SPO2 with pulse oximeter and return demonstration;Maintenance of O2 saturations>88%;Exhibits proper breathing techniques, such as pursed lip breathing or other method taught during program session;Compliance with respiratory medication;Demonstrates proper use of MDI's   Comments  Reviewed PLB technique with pt.  Talked about how it works and it's importance in maintaining their exercise saturations.  Sharon Cervantes is doing well with her PLB and has gotten a pulse oximeter to monitor her oxygen levels at home.  So far they are doing well.  Sharon Cervantes has noticed that her breathing has gotten worse over the last couple of days and she has needed to do a breathing treatment to help.  It is enough that she does not need her inhaler.  She is working on her PLB and it is getting better.  Sharon Cervantes is doing well with her breathing.  She has not needed  to use her nebulizer recently. She uses her PLB when she thinks about it.  Overall, she is doing better with breathing.  Continue to use PLB and inhalers to help control breathing.  Continue to improve functionality overall.   Goals/Expected Outcomes  Short: Become more profiecient at using PLB.   Long: Become independent at using PLB.  Short: Continue to ues PLB to help with  SOB in AM.  Long: Continue to monitor oxygen levels.  Short: Continue to use PLB and breathing treatments to help.  Long: Continue to monitor oxygen and breathing at home.  Short: Continue to use PLB more frequently  Long: Continue to keep eye on oxygen  Use PLB for avoid undue SOB      Oxygen Discharge (Final Oxygen Re-Evaluation): Oxygen Re-Evaluation - 01/18/20 0844      Program Oxygen Prescription   Program  Oxygen Prescription  None      Home Oxygen   Home Oxygen Device  None    Sleep Oxygen Prescription  None    Home Exercise Oxygen Prescription  None    Home at Rest Exercise Oxygen Prescription  None      Goals/Expected Outcomes   Short Term Goals  To learn and understand importance of monitoring SPO2 with pulse oximeter and demonstrate accurate use of the pulse oximeter.;To learn and understand importance of maintaining oxygen saturations>88%;To learn and demonstrate proper pursed lip breathing techniques or  other breathing techniques.;To learn and demonstrate proper use of respiratory medications    Long  Term Goals  Verbalizes importance of monitoring SPO2 with pulse oximeter and return demonstration;Maintenance of O2 saturations>88%;Exhibits proper breathing techniques, such as pursed lip breathing or other method taught during program session;Compliance with respiratory medication;Demonstrates proper use of MDI's    Comments  Continue to use PLB and inhalers to help control breathing.  Continue to improve functionality overall.    Goals/Expected Outcomes  Use PLB for avoid undue SOB       Initial Exercise Prescription: Initial Exercise Prescription - 10/11/19 1200      Date of Initial Exercise RX and Referring Provider   Date  10/11/19    Referring Provider  Claudette Stapler MD      Treadmill   MPH  1.5    Grade  0.5    Minutes  15    METs  2.25      NuStep   Level  1    SPM  80    Minutes  15    METs  2      T5 Nustep   Level  1    SPM  80    Minutes  15    METs  2      Biostep-RELP   Level  1    SPM  50    Minutes  15    METs  2      Prescription Details   Frequency (times per week)  3    Duration  Progress to 30 minutes of continuous aerobic without signs/symptoms of physical distress      Intensity   THRR 40-80% of Max Heartrate  114-139    Ratings of Perceived Exertion  11-13    Perceived Dyspnea  0-4      Progression   Progression  Continue to  progress workloads to maintain intensity without signs/symptoms of physical distress.      Resistance Training   Training Prescription  Yes    Weight  3 lb    Reps  10-15       Perform Capillary Blood Glucose checks as needed.  Exercise Prescription Changes: Exercise Prescription Changes    Row Name 10/11/19 1200 10/17/19 0900 10/23/19 0800 10/31/19 1400 11/15/19 0800     Response to Exercise   Blood Pressure (Admit)  124/74  138/88  --  126/74  100/60   Blood Pressure (Exercise)  148/76  152/68  --  110/64  102/70   Blood Pressure (Exit)  130/72  130/78  --  126/70  122/60   Heart Rate (Admit)  89 bpm  81 bpm  --  82 bpm  83 bpm   Heart Rate (Exercise)  122 bpm  114 bpm  --  90 bpm  98 bpm   Heart Rate (Exit)  91 bpm  90 bpm  --  70 bpm  --   Oxygen Saturation (Admit)  97 %  96 %  --  96 %  96 %   Oxygen Saturation (Exercise)  93 %  97 %  --  93 %  94 %   Oxygen Saturation (Exit)  98 %  98 %  --  96 %  94 %   Rating of Perceived Exertion (Exercise)  12  13  --  13  12   Perceived Dyspnea (Exercise)  2  3  --  1  1   Symptoms  SOb  --  --  none  none   Comments  walk test results  second day   --  --  --   Duration  --  Continue with 30 min of aerobic exercise without signs/symptoms of physical distress.  --  Continue with 30 min of aerobic exercise without signs/symptoms of physical distress.  Continue with 30 min of aerobic exercise without signs/symptoms of physical distress.   Intensity  --  THRR unchanged  --  THRR unchanged  THRR unchanged     Progression   Progression  --  Continue to progress workloads to maintain intensity without signs/symptoms of physical distress.  --  Continue to progress workloads to maintain intensity without signs/symptoms of physical distress.  Continue to progress workloads to maintain intensity without signs/symptoms of physical distress.   Average METs  --  2  --  2.06  2.8     Resistance Training   Training Prescription  --  Yes  --  Yes  Yes    Weight  --  3 lb  --  3 lb   5 lb   Reps  --  10-15  --  10-15  10-15     Interval Training   Interval Training  --  No  --  No  No     Treadmill   MPH  --  1.5  --  1.5  --   Grade  --  0.5  --  0.5  --   Minutes  --  15  --  15  --   METs  --  2.25  --  2.25  --     NuStep   Level  --  --  --  2  6   SPM  --  --  --  --  80   Minutes  --  --  --  15  15   METs  --  --  --  2  3.6     T5 Nustep   Level  --  3  --  3  --   SPM  --  80  --  --  --   Minutes  --  15  --  15  --   METs  --  2  --  2  --     Biostep-RELP   Level  --  --  --  4  6   SPM  --  --  --  --  50   Minutes  --  --  --  15  15   METs  --  --  --  2  2     Home Exercise Plan   Plans to continue exercise at  --  --  [employer] (comment) walking, gym, [employer] (comment) walking, gym, [employer] (comment) walking, gym, [employer]  --  --  Add 2 additional days to program exercise sessions.  Add 2 additional days to program exercise sessions.  Add 2 additional days to program exercise sessions.   Initial Home Exercises Provided  --  --  10/23/19  10/23/19  10/23/19   Row Name 11/27/19 1500 12/14/19 0800 12/25/19 1600 01/07/20 1700       Response to Exercise   Blood Pressure (Admit)  100/58  126/64  134/74  126/74    Blood Pressure (Exercise)  134/74  136/74  124/60  130/60    Blood Pressure (Exit)  92/60  112/58  136/74  126/74  Heart Rate (Admit)  85 bpm  64 bpm  83 bpm  74 bpm    Heart Rate (Exercise)  110 bpm  107 bpm  98 bpm  104 bpm    Heart Rate (Exit)  98 bpm  85 bpm  87 bpm  78 bpm    Oxygen Saturation (Admit)  95 %  97 %  95 %  93 %    Oxygen Saturation (Exercise)  93 %  95 %  92 %  93 %    Oxygen Saturation (Exit)  95 %  95 %  93 %  96 %    Rating of Perceived Exertion (Exercise)  '15  14  14  15    ' Perceived Dyspnea (Exercise)  '3  3  2  3    ' Symptoms  none  none  none  none    Duration  Continue with 30 min of aerobic exercise without  signs/symptoms of physical distress.  Continue with 30 min of aerobic exercise without signs/symptoms of physical distress.  Continue with 30 min of aerobic exercise without signs/symptoms of physical distress.  Continue with 30 min of aerobic exercise without signs/symptoms of physical distress.    Intensity  THRR unchanged  THRR unchanged  THRR unchanged  THRR unchanged      Progression   Progression  Continue to progress workloads to maintain intensity without signs/symptoms of physical distress.  Continue to progress workloads to maintain intensity without signs/symptoms of physical distress.  Continue to progress workloads to maintain intensity without signs/symptoms of physical distress.  Continue to progress workloads to maintain intensity without signs/symptoms of physical distress.    Average METs  3.65  3.6  3.56  3.2      Resistance Training   Training Prescription  Yes  Yes  Yes  Yes    Weight   5 lb  6 lb  6 lb  6 lb    Reps  10-15  10-15  10-15  10-15      Interval Training   Interval Training  No  No  No  No      Treadmill   MPH  2.5  2.6  2.3  2.3    Grade  0  1  0  0    Minutes  '15  15  15  15    ' METs  2.91  3.35  2.76  2.76      NuStep   Level  '7  7  7  ' --    SPM  --  80  --  --    Minutes  '15  15  15  ' --    METs  5.3  3.8  3  --      T5 Nustep   Level  5  --  6  6    SPM  --  --  --  80    Minutes  15  --  15  15    METs  2.4  --  3.5  3.6      Biostep-RELP   Level  7  --  7  --    Minutes  15  --  15  --    METs  4  --  5  --      Home Exercise Plan   Plans to continue exercise at  [employer] (comment) walking, gym, [employer] (comment) walking, gym, [employer] (comment) walking, gym, [employer]  [facility] (comment) walking, gym, [employer]    Frequency  Add 2 additional days to program exercise sessions.  Add 2 additional days to program exercise sessions.  Add 2 additional days to program exercise sessions.  Add 2  additional days to program exercise sessions.    Initial Home Exercises Provided  10/23/19  10/23/19  10/23/19  10/23/19       Exercise Comments: Exercise Comments    Row Name 10/15/19 0759 01/23/20 0828         Exercise Comments  First full day of exercise!  Patient was oriented to gym and equipment including functions, settings, policies, and procedures.  Patient's individual exercise prescription and treatment plan were reviewed.  All starting workloads were established based on the results of the 6 minute walk test done at initial orientation visit.  The plan for exercise progression was also introduced and progression will be customized based on patient's performance and goals.  Sharon Cervantes graduated today from  rehab with 35 sessions completed.  Details of the patient's exercise prescription and what She needs to do in order to continue the prescription and progress were discussed with patient.  Patient was given a copy of prescription and goals.  Patient verbalized understanding.  Sharon Cervantes plans to continue to exercise by eventually joining the [facility], she has knee surgery and PT following the surgery first. .         Exercise Goals and Review: Exercise Goals    Row Name 10/11/19 1241             Exercise Goals   Increase Physical Activity  Yes       Intervention  Provide advice, education, support and counseling about physical activity/exercise needs.;Develop an individualized exercise prescription for aerobic and resistive training based on initial evaluation findings, risk stratification, comorbidities and participant's personal goals.       Expected Outcomes  Short Term: Attend rehab on a regular basis to increase amount of physical activity.;Long Term: Add in home exercise to make exercise part of routine and to increase amount of physical activity.;Long Term: Exercising regularly at least 3-5 days a week.       Increase Strength and Stamina  Yes       Intervention  Provide advice,  education, support and counseling about physical activity/exercise needs.;Develop an individualized exercise prescription for aerobic and resistive training based on initial evaluation findings, risk stratification, comorbidities and participant's personal goals.       Expected Outcomes  Short Term: Increase workloads from initial exercise prescription for resistance, speed, and METs.;Short Term: Perform resistance training exercises routinely during rehab and add in resistance training at home;Long Term: Improve cardiorespiratory fitness, muscular endurance and strength as measured by increased METs and functional capacity (6MWT)       Able to understand and use rate of perceived exertion (RPE) scale  Yes       Intervention  Provide education and explanation on how to use RPE scale       Expected Outcomes  Short Term: Able to use RPE daily in rehab to express subjective intensity level;Long Term:  Able to use RPE to guide intensity level when exercising independently       Able to understand and use Dyspnea scale  Yes       Intervention  Provide education and explanation on how to use Dyspnea scale       Expected Outcomes  Short Term: Able to use Dyspnea scale daily in rehab to express subjective sense  of shortness of breath during exertion;Long Term: Able to use Dyspnea scale to guide intensity level when exercising independently       Knowledge and understanding of Target Heart Rate Range (THRR)  Yes       Intervention  Provide education and explanation of THRR including how the numbers were predicted and where they are located for reference       Expected Outcomes  Short Term: Able to state/look up THRR;Long Term: Able to use THRR to govern intensity when exercising independently;Short Term: Able to use daily as guideline for intensity in rehab       Able to check pulse independently  Yes       Intervention  Provide education and demonstration on how to check pulse in carotid and radial  arteries.;Review the importance of being able to check your own pulse for safety during independent exercise       Expected Outcomes  Short Term: Able to explain why pulse checking is important during independent exercise;Long Term: Able to check pulse independently and accurately       Understanding of Exercise Prescription  Yes       Intervention  Provide education, explanation, and written materials on patient's individual exercise prescription       Expected Outcomes  Short Term: Able to explain program exercise prescription;Long Term: Able to explain home exercise prescription to exercise independently          Exercise Goals Re-Evaluation : Exercise Goals Re-Evaluation    Row Name 10/15/19 0815 10/23/19 0804 10/29/19 0809 10/31/19 1415 11/15/19 0857     Exercise Goal Re-Evaluation   Exercise Goals Review  Able to understand and use Dyspnea scale;Able to understand and use rate of perceived exertion (RPE) scale;Knowledge and understanding of Target Heart Rate Range (THRR);Understanding of Exercise Prescription  Able to understand and use Dyspnea scale;Able to understand and use rate of perceived exertion (RPE) scale;Knowledge and understanding of Target Heart Rate Range (THRR);Understanding of Exercise Prescription;Increase Physical Activity;Increase Strength and Stamina;Able to check pulse independently  Increase Physical Activity;Increase Strength and Stamina;Understanding of Exercise Prescription  Increase Physical Activity;Increase Strength and Stamina;Understanding of Exercise Prescription  Increase Physical Activity;Increase Strength and Stamina;Able to understand and use rate of perceived exertion (RPE) scale;Able to understand and use Dyspnea scale;Knowledge and understanding of Target Heart Rate Range (THRR);Able to check pulse independently;Understanding of Exercise Prescription   Comments  Reviewed RPE scale, THR and program prescription with pt today.  Pt voiced understanding and was  given a copy of goals to take home.  Reviewed home exercise with pt today.  Pt plans to walk, go to [facility], and use videos for exercise.  She is planning to get a pulse oximeter from [employer]. Reviewed THR, pulse, RPE, sign and symptoms, and when to call 911 or MD.  Also discussed weather considerations and indoor options.  Pt voiced understanding.  Sharon Cervantes is off to good start.  She is already getting in her weights at house and going to gym at apartment complex.  She has gotten her pulse oximeter to monitor herself on treadmill at home.  Varina has been doing well in rehab. She is up to level 4 on the BioStep.  We will continue to monitor her progress.  Nicolasa has movd up to level 6 on NS and Biostep.  She is up to 5 lb for strength work.   Expected Outcomes  Short: Use RPE daily to regulate intensity. Long: Follow program prescription in THR.  Short: Start  to add in exercise at home.  Long: Continue to improve stamina.  Short: Continue to exercise on off days.  Long; Continue to attend regularly.  Short: Increase workload on NuStep.  Long: Continue to exercise on off days.  Short : continue to exercise consistently Long:  increase MET level   Row Name 11/27/19 1542 12/10/19 0743 12/14/19 0816 12/24/19 0753 01/07/20 1723     Exercise Goal Re-Evaluation   Exercise Goals Review  Increase Physical Activity;Increase Strength and Stamina;Understanding of Exercise Prescription  Increase Physical Activity;Increase Strength and Stamina;Understanding of Exercise Prescription  Increase Physical Activity;Increase Strength and Stamina;Able to understand and use rate of perceived exertion (RPE) scale;Able to understand and use Dyspnea scale;Knowledge and understanding of Target Heart Rate Range (THRR);Able to check pulse independently;Understanding of Exercise Prescription  Increase Physical Activity;Increase Strength and Stamina;Understanding of Exercise Prescription  Increase Physical Activity;Increase Strength and  Stamina;Able to understand and use rate of perceived exertion (RPE) scale;Able to understand and use Dyspnea scale;Knowledge and understanding of Target Heart Rate Range (THRR);Able to check pulse independently;Understanding of Exercise Prescription   Comments  Sharon Cervantes has been doing well in rehab. She was out on vacation but came extra days to make up her time. She was also planning to walk while she was gone.  She is now on level 7 for both NuStep and BioStep.  We will continue to monitor her progress.  Sharon Cervantes is doing well in rehab.  She admits that she could do better with her home exercise.  Her knee has been bothering her at home and she won't go walking.  She is supposed to go back to doctor to set up surgery.  She normally goes to apartment gym to walk on treadmill, but has been consistent with her weights.  We talked about adding in the staff videos for the chair exercises for her knee days.  Sharon Cervantes works at [employer] and has increased to 6 lb weights.  Staff will monitor progress.  Sharon Cervantes is doing well in rehab.  She is walking at home on her off days for about 20 min on hilly terrain.  She is getting her strength and stamina back!  Sharon Cervantes attends consistently and works at [employer].  Staff will monitor progress.   Expected Outcomes  Short: Continue to improve workloads. Long: Continue to improve stamina.  Short: Add in chair exercises  Long: Continue to improve strength for surgery.  Short:  continue consistent exercise Long:  build overall stamina  Short: Continue to exercise on her off days  Long: Continue to improve stamina.  Short: continue to exercise on her own Long: increase MET level   Row Name 01/18/20 0809             Exercise Goal Re-Evaluation   Exercise Goals Review  Increase Physical Activity;Increase Strength and Stamina;Understanding of Exercise Prescription       Comments  Sharon Cervantes is graduating next week.  She is headed off to a wedding in [place] and then getting ready for  her knee surgery.  She plans to continue to go to gym at her complex to maintain her exercise.       Expected Outcomes  Continue to exercise on her own          Discharge Exercise Prescription (Final Exercise Prescription Changes): Exercise Prescription Changes - 01/07/20 1700      Response to Exercise   Blood Pressure (Admit)  126/74    Blood Pressure (Exercise)  130/60  Blood Pressure (Exit)  126/74    Heart Rate (Admit)  74 bpm    Heart Rate (Exercise)  104 bpm    Heart Rate (Exit)  78 bpm    Oxygen Saturation (Admit)  93 %    Oxygen Saturation (Exercise)  93 %    Oxygen Saturation (Exit)  96 %    Rating of Perceived Exertion (Exercise)  15    Perceived Dyspnea (Exercise)  3    Symptoms  none    Duration  Continue with 30 min of aerobic exercise without signs/symptoms of physical distress.    Intensity  THRR unchanged      Progression   Progression  Continue to progress workloads to maintain intensity without signs/symptoms of physical distress.    Average METs  3.2      Resistance Training   Training Prescription  Yes    Weight  6 lb    Reps  10-15      Interval Training   Interval Training  No      Treadmill   MPH  2.3    Grade  0    Minutes  15    METs  2.76      T5 Nustep   Level  6    SPM  80    Minutes  15    METs  3.6      Home Exercise Plan   Plans to continue exercise at  [employer] (comment)   walking, gym, [employer]   Frequency  Add 2 additional days to program exercise sessions.    Initial Home Exercises Provided  10/23/19       Nutrition:  Target Goals: Understanding of nutrition guidelines, daily intake of sodium <1594m, cholesterol <2065m calories 30% from fat and 7% or less from saturated fats, daily to have 5 or more servings of fruits and vegetables.  Education: Controlling Sodium/Reading Food Labels -Group verbal and written material supporting the discussion of sodium use in heart healthy nutrition. Review and explanation with  models, verbal and written materials for utilization of the food label.   Education: General Nutrition Guidelines/Fats and Fiber: -Group instruction provided by verbal, written material, models and posters to present the general guidelines for heart healthy nutrition. Gives an explanation and review of dietary fats and fiber.   Biometrics: Pre Biometrics - 10/11/19 1241      Pre Biometrics   Height  5' 6.75" (1.695 m)    Weight  273 lb 14.4 oz (124.2 kg)    BMI (Calculated)  43.24    Single Leg Stand  6.76 seconds      Post Biometrics - 01/09/20 0844       Post  Biometrics   Height  5' 6.75" (1.695 m)    Weight  256 lb 6.4 oz (116.3 kg)    BMI (Calculated)  40.48    Single Leg Stand  10.7 seconds       Nutrition Therapy Plan and Nutrition Goals: Nutrition Therapy & Goals - 11/08/19 1009      Nutrition Therapy   Diet  HH, Low Na    Protein (specify units)  100-105g    Fiber  25 grams    Whole Grain Foods  3 servings    Saturated Fats  12 max. grams    Fruits and Vegetables  5 servings/day    Sodium  1.5 grams      Personal Nutrition Goals   Nutrition Goal  ST: repleace honey wheat with  100% wheat LT: eating better, weight loss    Comments  B instant grits, egg (scrambled) with bacon, peanut butter with natures own honey wheat and skippy's honey. tea - diet tea or coffee - cream, stevia. L: sandwich (pb or cheese or ham) D: salad (chicken caesar salad packet) with walnuts, bacon bits, extra cheese, seasame sticks. S: a few ritz crackers or kettle corn (rice cakes). Drinks: diet tea. trying to add more water (will usually add crystal lite). Pt reports not feeling hungry. energy improved since starting rehab 6-7/10 (was 0/10). Discussed HH eating.      Intervention Plan   Intervention  Prescribe, educate and counsel regarding individualized specific dietary modifications aiming towards targeted core components such as weight, hypertension, lipid management, diabetes, heart  failure and other comorbidities.;Nutrition handout(s) given to patient.    Expected Outcomes  Short Term Goal: Understand basic principles of dietary content, such as calories, fat, sodium, cholesterol and nutrients.;Short Term Goal: A plan has been developed with personal nutrition goals set during dietitian appointment.;Long Term Goal: Adherence to prescribed nutrition plan.       Nutrition Assessments: Nutrition Assessments - 01/16/20 0937      MEDFICTS Scores   Pre Score  29    Post Score  26    Score Difference  -3       MEDIFICTS Score Key:          ?70 Need to make dietary changes          40-70 Heart Healthy Diet         ? 40 Therapeutic Level Cholesterol Diet  Nutrition Goals Re-Evaluation: Nutrition Goals Re-Evaluation    Sharon Cervantes Name 12/03/19 0815 01/18/20 0843           Goals   Nutrition Goal  ST: continue with current changes LT: eating better, weight loss  --      Comment  Pt changed over the 100% whole wheat and bought carrots and apples to have for snacks. Pt reports enjoying that on its own but when she wants more will add peanut butter. Discussed low/no salt and low/no sugar added peanut butter, pt showed understanding. Pt reports would like to continue with current changes.  Sharon Cervantes is planning to stick with a healthier diet as she is already losing weight.  She feels better when she eats better too.      Expected Outcome  ST: continue with current changes LT: eating better, weight loss  Continue to focus on healthier eating habits.         Nutrition Goals Discharge (Final Nutrition Goals Re-Evaluation): Nutrition Goals Re-Evaluation - 01/18/20 Sharon Cervantes is planning to stick with a healthier diet as she is already losing weight.  She feels better when she eats better too.    Expected Outcome  Continue to focus on healthier eating habits.       Psychosocial: Target Goals: Acknowledge presence or absence of significant depression and/or  stress, maximize coping skills, provide positive support system. Participant is able to verbalize types and ability to use techniques and skills needed for reducing stress and depression.   Education: Depression - Provides group verbal and written instruction on the correlation between heart/lung disease and depressed mood, treatment options, and the stigmas associated with seeking treatment.   Education: Sleep Hygiene -Provides group verbal and written instruction about how sleep can affect your health.  Define sleep hygiene, discuss sleep cycles and impact  of sleep habits. Review good sleep hygiene tips.    Education: Stress and Anxiety: - Provides group verbal and written instruction about the health risks of elevated stress and causes of high stress.  Discuss the correlation between heart/lung disease and anxiety and treatment options. Review healthy ways to manage with stress and anxiety.   Initial Review & Psychosocial Screening: Initial Psych Review & Screening - 10/10/19 1420      Initial Review   Current issues with  Current Stress Concerns;History of Depression;Current Depression    Source of Stress Concerns  Chronic Illness;Financial    Comments  long history of pulm disease, finances are stressful and in a day to day mangament phase, not currently having symptoms      Family Dynamics   Good Support System?  No   friends [facility] and [place]) able to call when needed   Concerns  Inappropriate over/under dependence on family/friends      Barriers   Psychosocial barriers to participate in program  The patient should benefit from training in stress management and relaxation.;Psychosocial barriers identified (see note)      Screening Interventions   Interventions  Provide feedback about the scores to participant;To provide support and resources with identified psychosocial needs;Encouraged to exercise    Expected Outcomes  Short Term goal: Utilizing psychosocial counselor,  staff and physician to assist with identification of specific Stressors or current issues interfering with healing process. Setting desired goal for each stressor or current issue identified.;Long Term Goal: Stressors or current issues are controlled or eliminated.;Short Term goal: Identification and review with participant of any Quality of Life or Depression concerns found by scoring the questionnaire.;Long Term goal: The participant improves quality of Life and PHQ9 Scores as seen by post scores and/or verbalization of changes       Quality of Life Scores:  Scores of 19 and below usually indicate a poorer quality of life in these areas.  A difference of  2-3 points is a clinically meaningful difference.  A difference of 2-3 points in the total score of the Quality of Life Index has been associated with significant improvement in overall quality of life, self-image, physical symptoms, and general health in studies assessing change in quality of life.  PHQ-9: Recent Review Flowsheet Data    Depression screen [facility] 2/9 12/24/2019 12/10/2019 10/29/2019 10/11/2019 05/21/2019   Decreased Interest 0 2 0 1 0   Down, Depressed, Hopeless 0 3 0 1 0   PHQ - 2 Score 0 5 0 2 0   Altered sleeping 2 2 0 3 0   Tired, decreased energy 1 2 0 3 0   Change in appetite 0 0 0 3 0   Feeling bad or failure about yourself  0 1 0 1 0   Trouble concentrating 1 1 0 1 0   Moving slowly or fidgety/restless 0 0 0 0 0   Suicidal thoughts 0 0 0 0 0   PHQ-9 Score 4 11 0 13 0   Difficult doing work/chores Not difficult at all Somewhat difficult Not difficult at all Somewhat difficult Not difficult at all     Interpretation of Total Score  Total Score Depression Severity:  1-4 = Minimal depression, 5-9 = Mild depression, 10-14 = Moderate depression, 15-19 = Moderately severe depression, 20-27 = Severe depression   Psychosocial Evaluation and Intervention: Psychosocial Evaluation - 01/18/20 0840      Discharge Psychosocial  Assessment & Intervention   [place] is graduating next  week.  She is headed to a wedding in [place] and then when she gets home she is supposed to have knee surgery.  She has really enjoyed rehab especially getting to know others in her group and the staff.  She has noticed how big of a difference exercise makes in her mood and plans to continue to go to the gym in her complex.       Psychosocial Re-Evaluation: Psychosocial Re-Evaluation    Row Name 10/29/19 [phone] 12/10/19 0748 12/24/19 0800         Psychosocial Re-Evaluation   Current issues with  Current Stress Concerns;Current Sleep Concerns  Current Stress Concerns;Current Sleep Concerns  Current Stress Concerns;Current Sleep Concerns     Comments  PHQ improved to 0!!  She is feeling better overall.  Her sleep is still disrupted by her coughing, but overall it is good.  She denies symptoms of depression and attributes her improvements to getting back to working out and coming to class!  Most days she is doing good but does have some depressive symptoms that are keeping her from getting out and going to exericse at home.  She has several test coming up this week and a planned surgery on her knee.  She said that she is not too worried about them, just need to get them done.  Akansha is doing well in rehab.  Her PHQ score has improved as she has gotten moving again and determined to change her mind set.  She goes back to doctor today about her knee.  She is sleeping better still not all night but better.  Her cat wakes her up around 3-4am by pouncing on her.     Expected Outcomes  Short: Continue to exercise for her mentally health.  Long: Continue to boost mental health.  Short: Get out to exericse for mental boost.  Long: Continue to fight depression and cope positively.  Short: Continue to exercise for mood boost  Long: Continue to stay positive.     Interventions  Encouraged to attend Pulmonary Rehabilitation for the exercise   Encouraged to attend Pulmonary Rehabilitation for the exercise  Encouraged to attend Pulmonary Rehabilitation for the exercise     Continue Psychosocial Services   Follow up required by staff  Follow up required by staff  Follow up required by staff        Psychosocial Discharge (Final Psychosocial Re-Evaluation): Psychosocial Re-Evaluation - 12/24/19 0800      Psychosocial Re-Evaluation   Current issues with  Current Stress Concerns;Current Sleep Concerns    Comments  Sharon Cervantes is doing well in rehab.  Her PHQ score has improved as she has gotten moving again and determined to change her mind set.  She goes back to doctor today about her knee.  She is sleeping better still not all night but better.  Her cat wakes her up around 3-4am by pouncing on her.    Expected Outcomes  Short: Continue to exercise for mood boost  Long: Continue to stay positive.    Interventions  Encouraged to attend Pulmonary Rehabilitation for the exercise    Continue Psychosocial Services   Follow up required by staff       Education: Education Goals: Education classes will be provided on a weekly basis, covering required topics. Participant will state understanding/return demonstration of topics presented.  Learning Barriers/Preferences:   General Pulmonary Education Topics:  Infection Prevention: - Provides verbal and written material to individual with discussion of infection control including proper  hand washing and proper equipment cleaning during exercise session.   Pulmonary Rehab from 10/11/2019 in [facility] Cardiac and Pulmonary Rehab  Date  10/11/19  Educator  [facility]  Instruction Review Code  1- Verbalizes Understanding      Falls Prevention: - Provides verbal and written material to individual with discussion of falls prevention and safety.   Pulmonary Rehab from 10/11/2019 in [facility] Cardiac and Pulmonary Rehab  Date  10/11/19  Educator  [facility]  Instruction Review Code  1- Verbalizes Understanding       Chronic Lung Diseases: - Group verbal and written instruction to review updates, respiratory medications, advancements in procedures and treatments. Discuss use of supplemental oxygen including available portable oxygen systems, continuous and intermittent flow rates, concentrators, personal use and safety guidelines. Review proper use of inhaler and spacers. Provide informative websites for self-education.    Energy Conservation: - Provide group verbal and written instruction for methods to conserve energy, plan and organize activities. Instruct on pacing techniques, use of adaptive equipment and posture/positioning to relieve shortness of breath.   Triggers and Exacerbations: - Group verbal and written instruction to review types of environmental triggers and ways to prevent exacerbations. Discuss weather changes, air quality and the benefits of nasal washing. Review warning signs and symptoms to help prevent infections. Discuss techniques for effective airway clearance, coughing, and vibrations.   AED/CPR: - Group verbal and written instruction with the use of models to demonstrate the basic use of the AED with the basic ABC's of resuscitation.   Anatomy and Physiology of the Lungs: - Group verbal and written instruction with the use of models to provide basic lung anatomy and physiology related to function, structure and complications of lung disease.   Anatomy & Physiology of the Heart: - Group verbal and written instruction and models provide basic cardiac anatomy and physiology, with the coronary electrical and arterial systems. Review of Valvular disease and Heart Failure   Cardiac Medications: - Group verbal and written instruction to review commonly prescribed medications for heart disease. Reviews the medication, class of the drug, and side effects.   Other: -Provides group and verbal instruction on various topics (see comments)   Knowledge Questionnaire  Score: Knowledge Questionnaire Score - 01/16/20 0936      Knowledge Questionnaire Score   Pre Score  17/18 Education: O2 saturations    Post Score  16/18        Core Components/Risk Factors/Patient Goals at Admission: Personal Goals and Risk Factors at Admission - 10/11/19 1242      Core Components/Risk Factors/Patient Goals on Admission    Weight Management  Yes;Obesity;Weight Loss    Intervention  Weight Management: Develop a combined nutrition and exercise program designed to reach desired caloric intake, while maintaining appropriate intake of nutrient and fiber, sodium and fats, and appropriate energy expenditure required for the weight goal.;Weight Management/Obesity: Establish reasonable short term and long term weight goals.;Weight Management: Provide education and appropriate resources to help participant work on and attain dietary goals.;Obesity: Provide education and appropriate resources to help participant work on and attain dietary goals.    Admit Weight  273 lb 14.4 oz (124.2 kg)    Goal Weight: Short Term  268 lb (121.6 kg)    Goal Weight: Long Term  260 lb (117.9 kg)    Expected Outcomes  Short Term: Continue to assess and modify interventions until short term weight is achieved;Long Term: Adherence to nutrition and physical activity/exercise program aimed toward attainment of established weight goal;Weight  Loss: Understanding of general recommendations for a balanced deficit meal plan, which promotes 1-2 lb weight loss per week and includes a negative energy balance of [phone] kcal/d;Understanding recommendations for meals to include 15-35% energy as protein, 25-35% energy from fat, 35-60% energy from carbohydrates, less than 272m of dietary cholesterol, 20-35 gm of total fiber daily;Understanding of distribution of calorie intake throughout the day with the consumption of 4-5 meals/snacks    Improve shortness of breath with ADL's  Yes    Intervention  Provide education,  individualized exercise plan and daily activity instruction to help decrease symptoms of SOB with activities of daily living.    Expected Outcomes  Short Term: Improve cardiorespiratory fitness to achieve a reduction of symptoms when performing ADLs;Long Term: Be able to perform more ADLs without symptoms or delay the onset of symptoms    Hypertension  Yes    Intervention  Provide education on lifestyle modifcations including regular physical activity/exercise, weight management, moderate sodium restriction and increased consumption of fresh fruit, vegetables, and low fat dairy, alcohol moderation, and smoking cessation.;Monitor prescription use compliance.    Expected Outcomes  Long Term: Maintenance of blood pressure at goal levels.;Short Term: Continued assessment and intervention until BP is < 140/952mHG in hypertensive participants. < 130/8041mG in hypertensive participants with diabetes, heart failure or chronic kidney disease.    Lipids  Yes    Intervention  Provide education and support for participant on nutrition & aerobic/resistive exercise along with prescribed medications to achieve LDL <24m57mDL >40mg18m Expected Outcomes  Short Term: Participant states understanding of desired cholesterol values and is compliant with medications prescribed. Participant is following exercise prescription and nutrition guidelines.;Long Term: Cholesterol controlled with medications as prescribed, with individualized exercise RX and with personalized nutrition plan. Value goals: LDL < 24mg,78m > 40 mg.       Education:Diabetes - Individual verbal and written instruction to review signs/symptoms of diabetes, desired ranges of glucose level fasting, after meals and with exercise. Acknowledge that pre and post exercise glucose checks will be done for 3 sessions at entry of program.   Education: Know Your Numbers and Risk Factors: -Group verbal and written instruction about important numbers in your  health.  Discussion of what are risk factors and how they play a role in the disease process.  Review of Cholesterol, Blood Pressure, Diabetes, and BMI and the role they play in your overall health.   Core Components/Risk Factors/Patient Goals Review:  Goals and Risk Factor Review    Row Name 10/29/19 0810 12/10/19 0750 12/24/19 0755 01/18/20 0842       Core Components/Risk Factors/Patient Goals Review   Personal Goals Review  Weight Management/Obesity;Improve shortness of breath with ADL's;Hypertension;Lipids  Weight Management/Obesity;Improve shortness of breath with ADL's;Hypertension;Lipids  Weight Management/Obesity;Improve shortness of breath with ADL's;Hypertension;Lipids  Weight Management/Obesity;Improve shortness of breath with ADL's;Hypertension;Lipids    Review  Sharon Cervantes starting to lose weight and feeling good despite not eating as well while out of town.  Blood pressures have been good and she usually checks them at home.  Meds are good.  SOB in morning but it does improve as day goes by.  Sharon Cervantes well overall.  Her weight is holding steady and not moving as she is not getting in her exercise in on off days.  Her pressures have been running low with low fluid levels. She has been a little lightheaded and slightly dehydrated and we talked about getting in enough  water.  Her breathing has gotten worse the last couple of days and needed to do a breathing treatment to help.  Sharon Cervantes has lost almost 20 lb since her event.  She is glad to get it coming down again.  Her SOB has improved and feeling pretty good and has not needed her nebulizer.  Blood pressures are doing good overall.  She is back to drinking more water.  Sharon Cervantes continues to lose weight and has made that part of her journey she plans to continue with after graduation. Her breathing has improved and she does note that it is better when she exercises regularly.  She plans to continue to keep eye on blood pressures and  drink more water.    Expected Outcomes  Short: Continue to work SOB and weight loss.  Long: Continue to monitor risk factors.  Short: Drink more water.  Long: Continue to work on weight loss.  Short: Continue to work on breathing  Long: Continue to maintain weight loss  Continue to work on weight loss and monitor risk factors.       Core Components/Risk Factors/Patient Goals at Discharge (Final Review):  Goals and Risk Factor Review - 01/18/20 0842      Core Components/Risk Factors/Patient Goals Review   Personal Goals Review  Weight Management/Obesity;Improve shortness of breath with ADL's;Hypertension;Lipids    Review  Sharon Cervantes continues to lose weight and has made that part of her journey she plans to continue with after graduation. Her breathing has improved and she does note that it is better when she exercises regularly.  She plans to continue to keep eye on blood pressures and drink more water.    Expected Outcomes  Continue to work on weight loss and monitor risk factors.       ITP Comments: ITP Comments    Row Name 10/10/19 1439 10/11/19 1229 10/15/19 0759 10/24/19 0632 11/08/19 1135   ITP Comments  Completed virtual orientation today.  EP eval scheduled for 2/11 at 11am.  Documentation for diagnosis can be found in CE encounter 10/03/19.  Completed 6MWT and gym orientation.  Initial ITP created and sent for review to Dr. Emily Filbert, Medical Director.  First full day of exercise!  Patient was oriented to gym and equipment including functions, settings, policies, and procedures.  Patient's individual exercise prescription and treatment plan were reviewed.  All starting workloads were established based on the results of the 6 minute walk test done at initial orientation visit.  The plan for exercise progression was also introduced and progression will be customized based on patient's performance and goals.  30 day chart review completed. ITP sent to Dr Zachery Dakins Medical Director, for  review,changes as needed and signature.  Completed Initial RD Eval   Row Name 11/21/19 0635 12/19/19 0550 01/16/20 0615 01/23/20 0827     ITP Comments  30 day chart review completed. ITP sent to Dr Zachery Dakins Medical Director, for review,changes as needed and signature. Continue with ITP if no changes requested  30 Day review completed. Medical Director review done, changes made as directed,and approval shown by signature of Market researcher.  30 Day review completed. ITP review done, changes made as directed,and approval shown by signature of  Scientist, research (life sciences).  Sabiha graduated today from  rehab with 35 sessions completed.  Details of the patient's exercise prescription and what She needs to do in order to continue the prescription and progress were discussed with patient.  Patient was given a copy of  prescription and goals.  Patient verbalized understanding.  Kesha plans to continue to exercise by eventually joining the [facility], she has knee surgery and PT following the surgery first. .       Comments: Discharge ITP

## 2020-01-29 ENCOUNTER — Inpatient Hospital Stay: Admission: RE | Admit: 2020-01-29 | Payer: Medicare Other | Source: Ambulatory Visit

## 2020-01-31 ENCOUNTER — Other Ambulatory Visit: Payer: Self-pay

## 2020-01-31 ENCOUNTER — Encounter
Admission: RE | Admit: 2020-01-31 | Discharge: 2020-01-31 | Disposition: A | Discharge status: Home / Self Care | Payer: Medicare Other | Source: Ambulatory Visit | Attending: Orthopedic Surgery | Admitting: Orthopedic Surgery

## 2020-01-31 DIAGNOSIS — Z20822 Contact with and (suspected) exposure to covid-19: Secondary | ICD-10-CM | POA: Diagnosis not present

## 2020-01-31 DIAGNOSIS — Z01818 Encounter for other preprocedural examination: Secondary | ICD-10-CM | POA: Insufficient documentation

## 2020-01-31 HISTORY — DX: Sleep apnea, unspecified: G47.30

## 2020-01-31 HISTORY — DX: Essential (primary) hypertension: I10

## 2020-01-31 NOTE — Pre-Procedure Instructions (Signed)
Progress Notes - documented in this encounter Sharon Glazier, MD - 01/10/2020 8:00 AM EDT Formatting of this note is different from the original. Images from the original note were not included.    Pulmonary Clinic Follow up encounter  Follow up for :  [name] is 69 y.o. female who was seen September 2020 cough in the context of severe persistent asthma.  Interval History:  Pleasant female, 69 y.o. patient reports recent worsening of asthma with refractory symptoms despite full compliance.   She continues to have nasal rhinitis despite using flonase as well as Xyzal and Singulair.   We had stepped up therapy to high dose ICS via pulmicort nebulizer, Advair once daily, Albuterol twice daily at this.   05/11/2019- patient continues to have night time awakenings multiple times each night with cough and SOB.  I prescribed prednisone burst and Z pack for Red zone of asthma action plan but patient has needed it in June.   She feels that Atrovent intranasal is most helpful.  She had mild asthma exacerbation with use of red zone medications.    Cough has recurred we have tried numerous treatments at this point, she recently started gabapentin as salvage therapy and reported excellent efficacy but has had few intermittent periods of recurrence.   Continue Dupixent injection threrapy   01/10/20- Patient had initiated Physical therapy and has lost >20lbs with dietary modification. She has been on Dupixent injections and asthma symptoms have significantly improved. Nasal symptoms with rhinitis have improved. She does still get injection reactions at times but not for each injection.  Past medical History: She has a past medical history of Asthma, unspecified asthma severity, unspecified whether complicated, unspecified whether persistent, Depression, Heart murmur, unspecified, Hyperlipidemia, Hypertension, and Vitamin D deficiency.  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  has No Known Allergies.  ROS: 10 point ROS is negative except as per subjective findings  Current Medications (including changes made at this visit) Current Outpatient Medications  Medication Sig Dispense Refill  . albuterol (PROVENTIL) 2.5 mg /3 mL (0.083 %) nebulizer solution Take 3 mLs (2.5 mg total) by nebulization every 6 (six) hours as needed for Wheezing 1080 mL 3  . albuterol 90 mcg/actuation inhaler Inhale 2 inhalations into the lungs every 6 (six) hours as needed for Wheezing 1 Inhaler 10  . budesonide (PULMICORT) 0.5 mg/2 mL nebulizer solution Take 2 mLs (0.5 mg total) by nebulization 2 (two) times daily 360 mL 3  . cyanocobalamin (VITAMIN B12) 1,000 mcg/mL injection Inject 3 mLs into the muscle every 3 (three) months  . diclofenac (VOLTAREN) 1 % topical gel Apply topically  . DUPIXENT PEN 300 mg/2 mL pen injector INJECT 2 PENS UNDER THE SKIN ON DAY 1. 4 mL 5  . EPINEPHrine (EPIPEN) 0.3 mg/0.3 mL pen injector as needed  . fluticasone propion-salmeteroL (ADVAIR DISKUS) 500-50 mcg/dose diskus inhaler Inhale 1 inhalation into the lungs every 12 (twelve) hours TO REPLACE ADVAIR 250-50 dosage 1 Inhaler 12  . FUROsemide (LASIX) 20 MG tablet TAKE 1 TABLET BY MOUTH EVERY DAY AS NEEDED FOR EDEMA 30 tablet 1  . gabapentin (NEURONTIN) 300 MG capsule Take 1 capsule (300 mg total) by mouth 3 (three) times daily 90 capsule 1  . hydroCHLOROthiazide (HYDRODIURIL) 12.5 MG tablet Take 1 tablet (12.5 mg total) by mouth once daily 30 tablet 11  . hydrocodone-homatropine (HYCODAN)  5-1.5 mg/5 mL syrup Take 5 mLs by mouth every 6 (six) hours as needed for Cough 473 mL 0  . indomethacin (INDOCIN SR) 75 mg SR capsule TAKE 1 CAPSULE (75 MG TOTAL) BY MOUTH 2 (TWO) TIMES DAILY WITH A MEAL 60 capsule 0  . inhalational spacer (AEROCHAMBER) spacer Use as  instructed. 1 each 2  . ipratropium (ATROVENT) 0.06 % nasal spray Place 2 sprays into both nostrils 3 (three) times daily 15 mL 11  . omeprazole (PRILOSEC) 40 MG DR capsule Take 1 capsule (40 mg total) by mouth nightly 30 capsule 11  . pramipexole (MIRAPEX) 1.5 MG tablet Take 1.5 mg by mouth nightly  . venlafaxine (EFFEXOR-XR) 150 MG XR capsule Take 150 mg by mouth once daily   No current facility-administered medications for this visit.   Social History: She reports that she has never smoked. She has never used smokeless tobacco. She reports that she does not drink alcohol and does not use drugs.  Family History: Her family history includes Angina in her mother; Heart disease in her father and mother; High blood pressure (Hypertension) in her father.  Physical Exam: LMP (LMP Unknown)  GENERAL: NAD, able to speak in complete sentences without dyspnea or cough HEENT: normocephalic, PERRL, EOMI, TM with sharp light reflex bilaterally. Normal external auditory canal. No hypertrophy of nasal turbinates. Clear mucosa in mouth without any exudates or erythema NECK: Supple, no jugular venous distension, nodes, stridor nor thyromegaly. Trachea midline.  CV: RRR, no murmurs, gallops, rubs PULM: normal respiratory effort, clear to auscultation bilaterally. No crackles, or wheezes ABD: NABS, soft, non tender, non distended EXTR: No edema, homans sign or cyanosis NODES: No nodes in neck or supraclavicular areas SKIN: No rashes, bruising, telantagias, sclerodactaly nor alopecia NEURO: Cranial nerves, motor, sensation intact. Normal gait  Lab data and Imaging:   I have personally reviewed above lab data and imaging and discussed pertinent findings with patient during visit today.  MedicalDecision Making:   Impression/Plan:   Moderate persistent asthma- now with daily symptoms and poor control due to recent pollen/seasonal allergy onset -continue pulmicort - Asthma action plan - discussed and  educated patient how to use  -contineu with dupixent - significant improvement - if you need to use Red zone meds please call us -continue singulair, advair bid hold and hold flovent - take daily walk for PT and use albuterol neb prior to walking -has not used any antibiotic or prednisone since beginning of dupixent  Possible PAH TTE 11/07/2018 - Right ventricular systolic pressure is mildly elevated with an estimated pressure of 35.8 mmHg. Left Atrium: left atrial size was moderate to severely dilated  Chronic cough  Refractory to asthma treatement  - failed pulmicort nebulizer and had to use prednisone for asthma exacerbation' - will start short course of tussinex and gabapentin  Laryngopharyngeal reflux - due to moderate sized hiatal hernia- patient had mild dysphagia and could not perform funduplication but had hiatal hernia repaired.  - QHS omeprazole 40 mg   Allergic rhinitis  On Flonase , still having symptoms on nasal congestion , will start atrovent   Atelectasis  - contributing to dyspnea  - will start with incentive spirometer few times each hour as able  Morbid obesity  - contributing to breathlessness - will sign up for pulmonary rehab - encourage decreased PO intake -ideally would like to lose 1-2lbs per month ->20lbs lost  Georga Hacking, MD [facility]  Division of Pulmonary & Critical Care Medicine  Electronically signed by Vida Rigger, MD at 01/10/2020 8:16 AM EDT

## 2020-01-31 NOTE — Pre-Procedure Instructions (Signed)
NM myocardial perfusion SPECT multiple (stress and rest)01/16/2019 [facility] Result Impression  Negative Lexiscan stress. LV function normal. No evidence of ischemia.  Low risk study.  Result Narrative  CARDIOLOGY DEPARTMENT [facility] [address] [place], [place] [address] [phone]  Procedure: Pharmacologic Myocardial Perfusion Imaging  ONE day procedure  Indication: Essential hypertension Plan: NM myocardial perfusion SPECT multiple (stress     and rest), ECG stress test only  Hyperlipidemia, unspecified hyperlipidemia type Plan: NM myocardial perfusion SPECT multiple (stress     and rest), ECG stress test only  Obesity, Class III, BMI 40-49.9 (morbid obesity) (CMS-HCC) Plan: NM myocardial perfusion SPECT multiple (stress     and rest), ECG stress test only  Shortness of breath on exertion Plan: NM myocardial perfusion SPECT multiple (stress     and rest), ECG stress test only  Ordering Physician:   Dr. Harold Hedge   Clinical History: 69 y.o. year old female Vitals: Height: 65 in Weight: 253 lb Cardiac risk factors include:   Hyperlipidemia, HTN and Obesity    Procedure:  Pharmacologic stress testing was performed with Regadenoson using a single  use 0.4mg /8ml (0.08 mg/ml) prefilled syringe intravenously infused as a  bolus dose over 10-15 seconds. The stress test was stopped due to Infusion  completion. Blood pressure response was normal. The patient did not  develop any symptoms other than fatigue during the procedure.   Rest HR: 72bpm Rest BP: 102/84mmHg Max HR: 92bpm Min BP: 114/32mmHg  Stress Test Administered by: Percell Boston, CMA  ECG Interpretation: Rest ECG: normal sinus rhythm, none Stress ECG: normal sinus rhythm, no arrhythmia or ischemia Recovery ECG: normal sinus rhythm ECG Interpretation: non-diagnostic due to pharmacologic testing.   Administrations  This Visit   regadenoson (LEXISCAN) 0.4 mg/5 mL inj syringe 0.4 mg   Admin Date 01/16/2019 Action Given Dose 0.4 mg Route Intravenous Administered By Titus Dubin, CNMT     technetium Tc51m sestamibi (CARDIOLITE) injection 10.91 millicurie   Admin Date 01/16/2019 Action Given Dose 10.91 millicurie Route Intravenous Administered By Titus Dubin, CNMT     technetium Tc48m sestamibi (CARDIOLITE) injection 32.09 millicurie   Admin Date 01/16/2019 Action Given Dose 32.09 millicurie Route Intravenous Administered By Titus Dubin, CNMT       Gated post-stress perfusion imaging was performed 30 minutes after stress.  Rest images were performed 30 minutes after injection.  Gated LV Analysis:   Summary of LV Perfusion: Normal,  Summary of LV Function: Normal   TID Ratio: 0.95  LVEF= 73%  FINDINGS: Regional wall motion: reveals normal myocardial thickening and wall  motion. The overall quality of the study is excellent.  Artifacts noted: no Left ventricular cavity: normal.  Perfusion Analysis: SPECT images demonstrate homogeneous tracer  distribution throughout the myocardium. Defect type : Normal   Status Results Details   Encounter Summary

## 2020-01-31 NOTE — Pre-Procedure Instructions (Signed)
Progress Notes - documented in this encounter Matilde Haymaker, PA - 11/19/2019 10:00 AM EDT Formatting of this note is different from the original. Images from the original note were not included. Established Patient Visit   Chief Complaint: Chief Complaint  Patient presents with  . Follow-up  66mo  Date of Service: 11/19/2019 Date of Birth: 04-06-1951 PCP: McLean-Scocuzza, Pervis Hocking, MD  History of Present Illness: Sharon Cervantes is a 69 y.o.female patient with a past medical history significant for mitral insufficiency, mild pulmonary hypertension detected on echocardiogram, severe, persistent asthma, obstructive sleep apnea, intolerant to CPAP, hypertension, hyperlipidemia, and obesity who presents for a follow up visit. She is currently participating in pulmonary rehab three times a week and is doing well with this. She has lost 15 pounds over the past few months and continues to work on weight loss through diet and increased physical activity. She has noticed lower BP readings, with systolic readings in the 100s, with associated dizziness and lightheadedness. She also admits to occasional palpitations, but they are brief in duration and resolve without intervention. Lower extremity swelling is well controlled and she rarely takes Lasix. She denies chest pain or chest pressure, orthopnea, PND, or syncopal/presyncopal episodes.   Echocardiogram on 07/31/19 revealed normal LV systolic function with an EF estimated greater than 55% with mild TR, trivial MR. Grade 1 diastolic dysfunction noted. Right ventricle and right atrium were mildly enlarged. Left atrium was moderately enlarged.   Past Medical and Surgical History  Past Medical History Past Medical History:  Diagnosis Date  . Asthma, unspecified asthma severity, unspecified whether complicated, unspecified whether persistent  . Depression  . Heart murmur, unspecified  . Hyperlipidemia  . Hypertension  . 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   Medications and Allergies  Current Medications  Current Outpatient Medications  Medication Sig Dispense Refill  . albuterol (PROVENTIL) 2.5 mg /3 mL (0.083 %) nebulizer solution Take 3 mLs (2.5 mg total) by nebulization every 6 (six) hours as needed for Wheezing 1080 mL 3  . albuterol 90 mcg/actuation inhaler Inhale 2 inhalations into the lungs every 6 (six) hours as needed for Wheezing 1 Inhaler 10  . budesonide (PULMICORT) 0.5 mg/2 mL nebulizer solution Take 2 mLs (0.5 mg total) by nebulization 2 (two) times daily 360 mL 3  . DUPIXENT PEN 300 mg/2 mL pen injector INJECT 2 PENS UNDER THE SKIN ON DAY 1. 4 mL 5  . EPINEPHrine (EPIPEN) 0.3 mg/0.3 mL pen injector as needed  . fluticasone propion-salmeteroL (ADVAIR DISKUS) 500-50 mcg/dose diskus inhaler Inhale 1 inhalation into the lungs every 12 (twelve) hours TO REPLACE ADVAIR 250-50 dosage 1 Inhaler 12  . FUROsemide (LASIX) 20 MG tablet TAKE 1 TABLET BY MOUTH EVERY DAY AS NEEDED FOR EDEMA 30 tablet 1  . gabapentin (NEURONTIN) 300 MG capsule Take 1 capsule (300 mg total) by mouth 3 (three) times daily 90 capsule 1  . hydrocodone-homatropine (HYCODAN) 5-1.5 mg/5 mL syrup Take 5 mLs by mouth every 6 (six) hours as needed for Cough 473 mL 0  . inhalational spacer (AEROCHAMBER) spacer Use as instructed. 1 each 2  . ipratropium (ATROVENT) 0.06 % nasal spray Place 2 sprays into both nostrils 3 (three) times daily 15 mL 11  . olmesartan-hydrochlorothiazide (BENICAR HCT) 20-12.5 mg tablet Take 1 tablet by mouth once  daily 30 tablet 11  . omeprazole (PRILOSEC) 40 MG DR capsule Take 1 capsule (40 mg total) by mouth nightly 30 capsule 11  . pramipexole (MIRAPEX) 1.5 MG tablet Take 1.5 mg by mouth nightly  . venlafaxine  (EFFEXOR-XR) 150 MG XR capsule Take 150 mg by mouth once daily   No current facility-administered medications for this visit.   Allergies: Patient has no known allergies.  Social and Family History  Social History reports that she has never smoked. She has never used smokeless tobacco. She reports that she does not drink alcohol or use drugs.  Family History Family History  Problem Relation Age of Onset  . Heart disease Mother  . Angina Mother  . Heart disease Father  . High blood pressure (Hypertension) Father   Review of Systems   Review of Systems: The patient denies chest pain, shortness of breath, orthopnea, paroxysmal nocturnal dyspnea, pedal edema, palpitations, heart racing, fatigue, dizziness, lightheadedness, presyncope, syncope, leg pain, leg cramping. Review of 12 Systems is negative except as described in HPI.   Physical Examination   Vitals:BP 106/68  Pulse 103  Ht 165.1 cm (5\' 5" )  Wt (!) 118.8 kg (261 lb 12.8 oz)  LMP (LMP Unknown)  SpO2 98%  BMI 43.57 kg/m  Ht:165.1 cm (5\' 5" ) Wt:(!) 118.8 kg (261 lb 12.8 oz) TDV:VOHY surface area is 2.33 meters squared. Body mass index is 43.57 kg/m.  General: Well developed, obese. In no acute distress HEENT: Pupils equally reactive to light and accomodation  Neck: Supple without thyromegaly, or goiter. Carotid pulses 2+. No carotid bruits present.  Pulmonary: Clear to auscultation bilaterally; no wheezes, rales, rhonchi Cardiovascular: Tachycardic, regular rhythm. No gallops, murmurs or rubs Gastrointestinal: Soft nontender, nondistended, with normal bowel sounds Extremities: No cyanosis, clubbing, or edema Peripheral Pulses: 2+ in upper extremities, 2+ in lower extremities  Neurology: Alert and oriented X3 Pysch: Good affect. Responds appropriately  Assessment and Plan   69 y.o. female with  1. Essential hypertension  -With recent episodes of symptomatic hypotension  -Will reduce olmesartan-HCTZ from  40mg -12.5mg  daily to 20mg -12.5mg  daily and follow  -Encouraged to continue BP monitoring at home  2. Mitral valve insufficiency, unspecified etiology  -No significant insufficiency per recent echo; will continue to monitor  3. Pulmonary hypertension, mild (CMS-HCC)  -Intolerant to CPAP; currently participating in pulmonary rehab  4. Obstructive sleep apnea  -Intolerant to CPAP machine  -Will send in sleep study results to obtain oral appliance -Continue to work on weight loss  5. Hyperlipidemia, unspecified hyperlipidemia type  -Healthy, low fat diet recommended  6. Obesity, Class III, BMI 40-49.9 (morbid obesity) (CMS-HCC)  -With recent weight loss; continue to work on weight loss through dietary modifications and routine physical activity  7. Heart palpitations  -Stable; will continue to monitor     No orders of the defined types were placed in this encounter.  Return in about 6 months (around 05/21/2020).  I personally performed the service, non-incident to. (WP)   NICOLE Julian Hy, PA    Electronically signed by Zeb Comfort, PA at 11/19/2019 3:23 PM EDT

## 2020-01-31 NOTE — Patient Instructions (Addendum)
Your procedure is scheduled on: 02-05-20 TUESDAY Report to Same Day Surgery 2nd floor medical mall [facility] Entrance-take elevator on left to 2nd floor.  Check in with surgery information desk.) To find out your arrival time please call [phone] between 1PM - 3PM on 02-04-20   Remember: Instructions that are not followed completely may result in serious medical risk, up to and including death, or upon the discretion of your surgeon and anesthesiologist your surgery may need to be rescheduled.    _x___ 1. Do not eat food after midnight the night before your procedure. NO GUM OR CANDY AFTER MIDNIGHT. You may drink clear liquids up to 2 hours before you are scheduled to arrive at the hospital for your procedure.  Do not drink clear liquids within 2 hours of your scheduled arrival to the hospital.  Clear liquids include  --Water or Apple juice without pulp  --Gatorade  --Black Coffee or Clear Tea (No milk, no creamers, do not add anything to the coffee or Tea-ok to add sugar)   ____Ensure clear carbohydrate drink on the way to the hospital for bariatric patients  _x___Ensure clear carbohydrate drink-FINISH DRINK 2 HOUR PRIOR TO ARRIVAL TIME TO HOSPITAL DAY OF SURGERY    __x__ 2. No Alcohol for 24 hours before or after surgery.   __x__3. No Smoking or e-cigarettes for 24 prior to surgery.  Do not use any chewable tobacco products for at least 6 hour prior to surgery   ____  4. Bring all medications with you on the day of surgery if instructed.    __x__ 5. Notify your doctor if there is any change in your medical condition     (cold, fever, infections).    x___6. On the morning of surgery brush your teeth with toothpaste and water.  You may rinse your mouth with mouth wash if you wish.  Do not swallow any toothpaste or mouthwash.   Do not wear jewelry, make-up, hairpins, clips or nail polish.  Do not wear lotions, powders, or perfumes.  Do not shave 48 hours prior to surgery. Men  may shave face and neck.  Do not bring valuables to the hospital.    [facility] is not responsible for any belongings or valuables.               Contacts, dentures or bridgework may not be worn into surgery.  Leave your suitcase in the car. After surgery it may be brought to your room.  For patients admitted to the hospital, discharge time is determined by your  treatment team.  _  Patients discharged the day of surgery will not be allowed to drive home.  You will need someone to drive you home and stay with you the night of your procedure.    Please read over the following fact sheets that you were given:   [facility] Preparing for Surgery and MRSA Information/INCENTIVE SPIROMETER INSTRUCTIONS   _x___ TAKE THE FOLLOWING MEDICATION THE MORNING OF SURGERY WITH A SMALL SIP OF WATER. These include:  1. EFFEXOR (VENLAFAXINE)  2. PRILOSEC (OMEPRAZOLE)  3.  4.  5.  6.  ____Fleets enema or Magnesium Citrate as directed.   _x___ Use CHG Soap or sage wipes as directed on instruction sheet   _X___ Use inhalers on the day of surgery and bring to hospital day of surgery-USE YOUR PULMICORT NEBULIZER MORNING OF SURGERY AND BRING YOUR ALBUTEROL INHALER TO THE HOSPITAL  ____ Stop Metformin and Janumet 2 days prior to  surgery.    ____ Take 1/2 of usual insulin dose the night before surgery and none on the morning surgery.   ____ Follow recommendations from Cardiologist, Pulmonologist or PCP regarding stopping Aspirin, Coumadin, Plavix ,Eliquis, Effient, or Pradaxa, and Pletal.  X____Stop Anti-inflammatories such as Advil, Aleve, Ibuprofen, Motrin, Naproxen, Naprosyn, Goodies powders or aspirin products NOW-OK to take Tylenol    ____ Stop supplements until after surgery   ____ Bring C-Pap to the hospital.

## 2020-01-31 NOTE — Pre-Procedure Instructions (Signed)
Echo complete12/08/2018 [place] Component Name Value Ref Range  LV Ejection Fraction (%) 55   Aortic Valve Regurgitation Grade none   Aortic Valve Stenosis Grade none   Aortic Valve Max Velocity (m/s) 1.4 m/sec  Aortic Valve Stenosis Mean Gradient (mmHg) 4.0 mmHg  Mitral Valve Regurgitation Grade trivial   Mitral Valve Stenosis Grade none   Tricuspid Valve Regurgitation Grade mild   Tricuspid Valve Regurgitation Max Velocity (m/s) 2.7 m/sec  Right Ventricle Systolic Pressure (mmHg) 61.4 mmHg  LV End Diastolic Diameter (cm) 4.3 cm  LV End Systolic Diameter (cm) 3 cm  LV Septum Wall Thickness (cm) 0.84 cm  LV Posterior Wall Thickness (cm) 0.7 cm  Left Atrium Diameter (cm) 4.5 cm  Result Narrative             CARDIOLOGY DEPARTMENT         Sharon Cervantes                  [id number]      Joiner #: [id number]      [address] [place],  [address]    Date: 07/31/2019 09: 33 AM                                Adult  Female  Age: 69 yrs      ECHOCARDIOGRAM REPORT               Outpatient                                [name]    STUDY:CHEST WALL        TAPE:0000: 00: 0: 00: 00 MD1: Cervantes, Sharon LYNN    ECHO:Yes  DOPPLER:Yes    FILE:[id number]    COLOR:Yes  CONTRAST:No   MACHINE:Philips  RV BIOPSY:No     3D:No SOUND QLTY:Moderate   MEDIUM:None _________________________________________________________________________________________        HISTORY: DOE         REASON: Assess, LV function       INDICATION: I34.0 Nonrheumatic mitral (valve) insufficiency, I27.2 Other             secondary pulmonary  hypertension _________________________________________________________________________________________ ECHOCARDIOGRAPHIC MEASUREMENTS 2D DIMENSIONS AORTA         Values  Normal Range  MAIN PA     Values  Normal Range        Annulus: nm*     [2.1-2.5]     PA Main: nm*    [1.5-2.1]       Aorta Sin: nm*     [2.7-3.3]  RIGHT VENTRICLE      ST Junction: nm*     [2.3-2.9]     RV Base: 4.2 cm  [< 4.2]       Asc.Aorta: nm*     [2.3-3.1]     RV Mid: nm*    [<3.5] LEFT VENTRICLE                   RV Length: nm*    [<8.6]         LVIDd: 4.3 cm    [3.9-5.3]  INFERIOR VENA CAVA         LVIDs: 3.0 cm  Max. IVC: nm*    [<=2.1]           FS: 31.6 %    [>25]      Min. IVC: nm*          SWT: 0.84 cm   [0.5-0.9]  ------------------          PWT: 0.70 cm   [0.5-0.9]  nm* - not measured LEFT ATRIUM        LA Diam: 4.5 cm    [2.7-3.8]      LA A4C Area: nm*     [<20]       LA Volume: nm*     [22-52] _________________________________________________________________________________________ ECHOCARDIOGRAPHIC DESCRIPTIONS AORTIC ROOT          Size: Normal       Dissection: INDETERM FOR DISSECTION AORTIC VALVE        Leaflets: Tricuspid          Morphology: Normal        Mobility: Fully mobile LEFT VENTRICLE          Size: Normal            Anterior: Normal      Contraction: Normal             Lateral: Normal       Closest EF: >55% (Estimated)        Septal: Normal       LV Masses: No Masses            Apical: Normal          LVH: None             Inferior: Normal                           Posterior: Normal      Dias.FxClass: (Grade 1)  relaxation abnormal, E/A reversal MITRAL VALVE        Leaflets: Normal            Mobility: Fully mobile       Morphology: ANNULAR CALC LEFT ATRIUM          Size: MODERATELY ENLARGED     LA Masses: No masses       IA Septum: Normal IAS MAIN PA          Size: Normal PULMONIC VALVE       Morphology: Normal            Mobility: Fully mobile RIGHT VENTRICLE          Size: MILDLY ENLARGED       Free Wall: Normal      Contraction: Normal            RV Masses: No Masses TRICUSPID VALVE        Leaflets: Normal            Mobility: Fully mobile       Morphology: Normal RIGHT ATRIUM          Size: MILDLY ENLARGED        RA Other: None        RA Mass: No masses PERICARDIUM         Fluid: No effusion INFERIOR VENACAVA          Size: SMALL Normal respiratory collapse _________________________________________________________________________________________  DOPPLER ECHO and OTHER SPECIAL PROCEDURES         Aortic: No AR  No AS             144.5 cm/sec peak vel   8.3 mmHg peak grad             4.0 mmHg mean grad     3.1 cm^2 by DOPPLER         Mitral: TRIVIAL MR         No MS             MV Inflow E Vel = 109.0 cm/sec   MV Annulus E'Vel = 7.9 cm/sec             E/E'Ratio = 13.8       Tricuspid: MILD TR          No TS             268.6 cm/sec peak TR vel  33.9 mmHg peak RV pressure       Pulmonary: TRIVIAL PR         No PS _________________________________________________________________________________________ INTERPRETATION NORMAL LEFT VENTRICULAR SYSTOLIC FUNCTION NORMAL RIGHT VENTRICULAR SYSTOLIC FUNCTION MILD VALVULAR REGURGITATION (See above) NO VALVULAR STENOSIS Closest EF: >55%  (Estimated) Mitral: TRIVIAL MR Tricuspid: MILD TR _________________________________________________________________________________________ Electronically signed by      MD Sharon Cervantes on 07/31/2019 12: 26 PM      Performed By: Sharon Cervantes, RDCS, RVT   Ordering Physician: Sharon Cervantes _________________________________________________________________________________________  Other Result Information  Interface, Text Results In - 07/31/2019 12:27 PM EST                       CARDIOLOGY DEPARTMENT                  Sharon Cervantes, Sharon Cervantes           [facility]                                    [id number]           [facility]                           Acct #: [id number]           [address] [place], [place] [address]       Date: 07/31/2019 09: 15 AM                                                              Adult   Female    Age: 78 yrs           ECHOCARDIOGRAM REPORT                              Outpatient                                                              [facility]      STUDY:CHEST WALL  TAPE:0000: 00: 0: 00: 00 MD1: Cervantes, Sharon LYNN       ECHO:Yes    DOPPLER:Yes       FILE:[id number]      COLOR:Yes   CONTRAST:No     MACHINE:Philips  RV BIOPSY:No          3D:No  SOUND QLTY:Moderate     MEDIUM:None _________________________________________________________________________________________               HISTORY: DOE                REASON: Assess, LV function            INDICATION: I34.0 Nonrheumatic mitral (valve) insufficiency, I27.2 Other                        secondary pulmonary hypertension _________________________________________________________________________________________ ECHOCARDIOGRAPHIC MEASUREMENTS 2D DIMENSIONS AORTA                  Values   Normal Range   MAIN PA         Values    Normal Range               Annulus: nm*          [2.1-2.5]         PA Main: nm*       [1.5-2.1]             Aorta Sin: nm*           [2.7-3.3]    RIGHT VENTRICLE           ST Junction: nm*          [2.3-2.9]         RV Base: 4.2 cm    [< 4.2]             Asc.Aorta: nm*          [2.3-3.1]          RV Mid: nm*       [<3.5] LEFT VENTRICLE                                      RV Length: nm*       [<8.6]                 LVIDd: 4.3 cm       [3.9-5.3]    INFERIOR VENA CAVA                 LVIDs: 3.0 cm                        Max. IVC: nm*       [<=2.1]                    FS: 31.6 %       [>25]            Min. IVC: nm*                   SWT: 0.84 cm      [0.5-0.9]    ------------------                   PWT: 0.70 cm      [0.5-0.9]    nm* - not measured LEFT ATRIUM  LA Diam: 4.5 cm       [2.7-3.8]           LA A4C Area: nm*          [<20]             LA Volume: nm*          [22-52] _________________________________________________________________________________________ ECHOCARDIOGRAPHIC DESCRIPTIONS AORTIC ROOT                  Size: Normal            Dissection: INDETERM FOR DISSECTION AORTIC VALVE              Leaflets: Tricuspid                   Morphology: Normal              Mobility: Fully mobile LEFT VENTRICLE                  Size: Normal                        Anterior: Normal           Contraction: Normal                         Lateral: Normal            Closest EF: >55% (Estimated)                Septal: Normal             LV Masses: No Masses                       Apical: Normal                   LVH: None                          Inferior: Normal                                                     Posterior: Normal          Dias.FxClass: (Grade 1) relaxation abnormal, E/A reversal MITRAL VALVE              Leaflets: Normal                        Mobility: Fully mobile            Morphology: ANNULAR CALC LEFT ATRIUM                  Size: MODERATELY ENLARGED          LA Masses: No masses             IA Septum: Normal IAS MAIN PA                  Size: Normal PULMONIC VALVE             Morphology: Normal                        Mobility: Fully mobile RIGHT VENTRICLE  Size: MILDLY ENLARGED              Free Wall: Normal           Contraction: Normal                       RV Masses: No Masses TRICUSPID VALVE              Leaflets: Normal                        Mobility: Fully mobile            Morphology: Normal RIGHT ATRIUM                  Size: MILDLY ENLARGED               RA Other: None               RA Mass: No masses PERICARDIUM                 Fluid: No effusion INFERIOR VENACAVA                  Size: SMALL Normal respiratory collapse _________________________________________________________________________________________  DOPPLER ECHO and OTHER SPECIAL PROCEDURES                Aortic: No AR                      No AS                        144.5 cm/sec peak vel      8.3 mmHg peak grad                        4.0 mmHg mean grad         3.1 cm^2 by DOPPLER                Mitral: TRIVIAL MR                 No MS                        MV Inflow E Vel = 109.0 cm/sec      MV Annulus E'Vel = 7.9 cm/sec                        E/E'Ratio = 13.8             Tricuspid: MILD TR                    No TS                        268.6 cm/sec peak TR vel   33.9 mmHg peak RV pressure             Pulmonary: TRIVIAL PR                 No PS _________________________________________________________________________________________ INTERPRETATION NORMAL LEFT VENTRICULAR SYSTOLIC FUNCTION NORMAL RIGHT VENTRICULAR SYSTOLIC FUNCTION MILD VALVULAR REGURGITATION (See above) NO VALVULAR STENOSIS Closest EF: >55% (Estimated) Mitral: TRIVIAL MR Tricuspid: MILD TR _________________________________________________________________________________________ Electronically signed by            MD Sharon KanskyKen Fath on 07/31/2019 12: 26 PM  Performed By: Mathis Bud, RVT    Ordering Physician: Sharon Cervantes _________________________________________________________________________________________  Status Results Details   Encounter Summary

## 2020-02-01 ENCOUNTER — Other Ambulatory Visit: Payer: Medicare Other

## 2020-02-01 ENCOUNTER — Encounter
Admission: RE | Admit: 2020-02-01 | Discharge: 2020-02-01 | Disposition: A | Discharge status: Home / Self Care | Payer: Medicare Other | Source: Ambulatory Visit | Attending: Orthopedic Surgery | Admitting: Orthopedic Surgery

## 2020-02-01 DIAGNOSIS — Z01818 Encounter for other preprocedural examination: Secondary | ICD-10-CM | POA: Diagnosis not present

## 2020-02-01 LAB — CBC WITH DIFFERENTIAL/PLATELET
Abs Immature Granulocytes: 0.02 10*3/uL (ref 0.00–0.07)
Basophils Absolute: 0 10*3/uL (ref 0.0–0.1)
Basophils Relative: 0 %
Eosinophils Absolute: 0 10*3/uL (ref 0.0–0.5)
Eosinophils Relative: 0 %
HCT: 38.6 % (ref 36.0–46.0)
Hemoglobin: 12.4 g/dL (ref 12.0–15.0)
Immature Granulocytes: 0 %
Lymphocytes Relative: 26 %
Lymphs Abs: 1.4 10*3/uL (ref 0.7–4.0)
MCH: 27.8 pg (ref 26.0–34.0)
MCHC: 32.1 g/dL (ref 30.0–36.0)
MCV: 86.5 fL (ref 80.0–100.0)
Monocytes Absolute: 0.6 10*3/uL (ref 0.1–1.0)
Monocytes Relative: 12 %
Neutro Abs: 3.4 10*3/uL (ref 1.7–7.7)
Neutrophils Relative %: 62 %
Platelets: 220 10*3/uL (ref 150–400)
RBC: 4.46 MIL/uL (ref 3.87–5.11)
RDW: 13.2 % (ref 11.5–15.5)
WBC: 5.5 10*3/uL (ref 4.0–10.5)
nRBC: 0 % (ref 0.0–0.2)

## 2020-02-01 LAB — URINALYSIS, ROUTINE W REFLEX MICROSCOPIC
Bilirubin Urine: NEGATIVE
Glucose, UA: NEGATIVE mg/dL
Hgb urine dipstick: NEGATIVE
Ketones, ur: NEGATIVE mg/dL
Nitrite: NEGATIVE
Protein, ur: NEGATIVE mg/dL
Specific Gravity, Urine: 1.016 (ref 1.005–1.030)
pH: 5 (ref 5.0–8.0)

## 2020-02-01 LAB — COMPREHENSIVE METABOLIC PANEL
ALT: 15 U/L (ref 0–44)
AST: 19 U/L (ref 15–41)
Albumin: 3.8 g/dL (ref 3.5–5.0)
Alkaline Phosphatase: 111 U/L (ref 38–126)
Anion gap: 9 (ref 5–15)
BUN: 24 mg/dL — ABNORMAL HIGH (ref 8–23)
CO2: 28 mmol/L (ref 22–32)
Calcium: 9 mg/dL (ref 8.9–10.3)
Chloride: 101 mmol/L (ref 98–111)
Creatinine, Ser: 0.99 mg/dL (ref 0.44–1.00)
GFR calc Af Amer: >60 mL/min (ref >60)
GFR calc non Af Amer: 58 mL/min — ABNORMAL LOW (ref >60)
Glucose, Bld: 100 mg/dL — ABNORMAL HIGH (ref 70–99)
Potassium: 3.7 mmol/L (ref 3.5–5.1)
Sodium: 138 mmol/L (ref 135–145)
Total Bilirubin: 0.5 mg/dL (ref 0.3–1.2)
Total Protein: 7 g/dL (ref 6.5–8.1)

## 2020-02-01 LAB — TYPE AND SCREEN
ABO/RH(D): O POS
Antibody Screen: NEGATIVE

## 2020-02-01 LAB — SURGICAL PCR SCREEN
MRSA, PCR: NEGATIVE
Staphylococcus aureus: NEGATIVE

## 2020-02-01 LAB — SARS CORONAVIRUS 2 (TAT 6-24 HRS): SARS Coronavirus 2: NEGATIVE

## 2020-02-04 NOTE — H&P (Signed)
Chief Complaint  Patient presents with   Follow-up  Rt TKA 02/05/20   History of the Present Illness: Sharon Cervantes is a 69 y.o. female here for H and P prior to right total knee arthroplasty on 02/05/2020. The patient obtained her MyKnee CT 2 months ago.   I have reviewed past medical, surgical, social and family history, and allergies as documented in the EMR.  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/2017   Right knee arthroscopy Right 2018   TONSILLECTOMY   Past Family History: Family History  Problem Relation Age of Onset   Heart disease Mother   Angina Mother   Heart disease Father   High blood pressure (Hypertension) Father   Medications: Current Outpatient Medications Ordered in Epic  Medication Sig Dispense Refill   albuterol (PROVENTIL) 2.5 mg /3 mL (0.083 %) nebulizer solution Take 3 mLs (2.5 mg total) by nebulization every 6 (six) hours as needed for Wheezing 1080 mL 3   albuterol 90 mcg/actuation inhaler Inhale 2 inhalations into the lungs every 6 (six) hours as needed for Wheezing 1 Inhaler 10   budesonide (PULMICORT) 0.5 mg/2 mL nebulizer solution Take 2 mLs (0.5 mg total) by nebulization 2 (two) times daily (Patient not taking: Reported on 01/10/2020 ) 360 mL 3   cyanocobalamin (VITAMIN B12) 1,000 mcg/mL injection Inject 3 mLs into the muscle every 3 (three) months   diclofenac (VOLTAREN) 1 % topical gel Apply topically (Patient not taking: Reported on 01/10/2020 )   DUPIXENT PEN 300 mg/2 mL pen injector INJECT 2 PENS UNDER THE SKIN ON DAY 1. 4 mL 5    EPINEPHrine (EPIPEN) 0.3 mg/0.3 mL pen injector as needed   fluticasone propion-salmeteroL (ADVAIR DISKUS) 500-50 mcg/dose diskus inhaler Inhale 1 inhalation into the lungs every 12 (twelve) hours TO REPLACE ADVAIR 250-50 dosage 1 Inhaler 12   gabapentin (NEURONTIN) 300 MG capsule Take 1 capsule (300 mg total) by mouth 3 (three) times daily 90 capsule 1   hydrocodone-homatropine (HYCODAN) 5-1.5 mg/5 mL syrup Take 5 mLs by mouth every 6 (six) hours as needed for Cough 473 mL 0   indomethacin (INDOCIN SR) 75 mg SR capsule TAKE 1 CAPSULE (75 MG TOTAL) BY MOUTH 2 (TWO) TIMES DAILY WITH A MEAL 60 capsule 0   inhalational spacer (AEROCHAMBER) spacer Use as instructed. 1 each 2   ipratropium (ATROVENT) 0.06 % nasal spray Place 2 sprays into both nostrils 3 (three) times daily 15 mL 11   montelukast (SINGULAIR) 10 mg tablet Take 1 tablet by mouth once daily   omeprazole (PRILOSEC) 40 MG DR capsule Take 1 capsule (40 mg total) by mouth nightly 30 capsule 11   pramipexole (MIRAPEX) 1.5 MG tablet Take 1.5 mg by mouth nightly   venlafaxine (EFFEXOR-XR) 150 MG XR capsule Take 150 mg by mouth once daily   No current Epic-ordered facility-administered medications on file.   Allergies: No Known Allergies   Body mass index is 42.47 kg/m.  Review of Systems: A comprehensive 14 point ROS was performed, reviewed, and the pertinent orthopaedic findings are documented in the HPI.  Vitals:  01/21/20 1014  BP: 122/76  General Physical Examination:  General/Constitutional: No apparent distress: well-nourished and well developed. Eyes: Pupils equal, round with synchronous movement. Lungs: Clear to auscultation HEENT: Normal Vascular: No edema, swelling or tenderness, except as noted in detailed exam. Cardiac: Heart rate and rhythm is regular. Integumentary: No impressive skin lesions present, except as noted in detailed exam. Neuro/Psych: Normal mood and affect, oriented to person, place  and time.  Musculoskeletal Examination: On exam, lungs are clear. Heart rate and rhythm is normal. HEENT is normal. Diffuse swelling with Baker cyst on the right knee. Flexion contracture of the right knee and flexion to 100 degrees. Good palpable pulses to the right leg and no significant edema.   Radiographs: No new imaging studies were obtained or reviewed today.  Assessment: ICD-10-CM  1. Primary osteoarthritis of right knee M17.11   Plan: The patient has clinical findings of severe right knee osteoarthritis.   I reviewed the patient's prior x-rays and we discussed over the procedure and postoperative course. All the patient's questions were answered.   The patient will present for surgery in 2 weeks.  Surgical Risks:  The nature of the condition and the proposed procedure has been reviewed in detail with the patient. Surgical versus non-surgical options and prognosis for recovery have been reviewed and the inherent risks and benefits of each have been discussed including the risks of infection, bleeding, injury to nerves/blood vessels/tendons, incomplete relief of symptoms, persisting pain and/or stiffness, loss of function, complex regional pain syndrome, failure of the procedure, as appropriate.  Teeth: Nothing loose or removable.   Scribe Attestation: I, Dawn Royse, am acting as scribe for [employer], MD    Electronically signed by Marlena Clipper, MD at 01/21/2020 7:46 PM EDT  Reviewed paper H+P, will be scanned into chart. No changes noted.

## 2020-02-05 ENCOUNTER — Inpatient Hospital Stay: Payer: Medicare Other | Admitting: Anesthesiology

## 2020-02-05 ENCOUNTER — Inpatient Hospital Stay
Admission: RE | Admit: 2020-02-05 | Discharge: 2020-02-08 | DRG: 470 | Disposition: A | Discharge status: Home / Self Care | Payer: Medicare Other | Attending: Orthopedic Surgery | Admitting: Orthopedic Surgery

## 2020-02-05 ENCOUNTER — Observation Stay: Payer: Medicare Other

## 2020-02-05 ENCOUNTER — Encounter: Payer: Self-pay | Admitting: Orthopedic Surgery

## 2020-02-05 ENCOUNTER — Encounter
Admission: RE | Disposition: A | Discharge status: Home / Self Care | Payer: Self-pay | Source: Home / Self Care | Attending: Orthopedic Surgery

## 2020-02-05 ENCOUNTER — Other Ambulatory Visit: Payer: Self-pay

## 2020-02-05 DIAGNOSIS — Z79899 Other long term (current) drug therapy: Secondary | ICD-10-CM

## 2020-02-05 DIAGNOSIS — G8918 Other acute postprocedural pain: Secondary | ICD-10-CM

## 2020-02-05 DIAGNOSIS — H3530 Unspecified macular degeneration: Secondary | ICD-10-CM | POA: Diagnosis present

## 2020-02-05 DIAGNOSIS — Z6841 Body Mass Index (BMI) 40.0 and over, adult: Secondary | ICD-10-CM

## 2020-02-05 DIAGNOSIS — E785 Hyperlipidemia, unspecified: Secondary | ICD-10-CM | POA: Diagnosis present

## 2020-02-05 DIAGNOSIS — Z7952 Long term (current) use of systemic steroids: Secondary | ICD-10-CM

## 2020-02-05 DIAGNOSIS — G2581 Restless legs syndrome: Secondary | ICD-10-CM | POA: Diagnosis present

## 2020-02-05 DIAGNOSIS — I10 Essential (primary) hypertension: Secondary | ICD-10-CM | POA: Diagnosis present

## 2020-02-05 DIAGNOSIS — R011 Cardiac murmur, unspecified: Secondary | ICD-10-CM | POA: Diagnosis present

## 2020-02-05 DIAGNOSIS — F329 Major depressive disorder, single episode, unspecified: Secondary | ICD-10-CM | POA: Diagnosis present

## 2020-02-05 DIAGNOSIS — Z96651 Presence of right artificial knee joint: Secondary | ICD-10-CM

## 2020-02-05 DIAGNOSIS — R05 Cough: Secondary | ICD-10-CM | POA: Diagnosis present

## 2020-02-05 DIAGNOSIS — Z8249 Family history of ischemic heart disease and other diseases of the circulatory system: Secondary | ICD-10-CM

## 2020-02-05 DIAGNOSIS — K219 Gastro-esophageal reflux disease without esophagitis: Secondary | ICD-10-CM | POA: Diagnosis present

## 2020-02-05 DIAGNOSIS — M1711 Unilateral primary osteoarthritis, right knee: Principal | ICD-10-CM | POA: Diagnosis present

## 2020-02-05 DIAGNOSIS — D510 Vitamin B12 deficiency anemia due to intrinsic factor deficiency: Secondary | ICD-10-CM | POA: Diagnosis present

## 2020-02-05 HISTORY — PX: TOTAL KNEE ARTHROPLASTY: SHX125

## 2020-02-05 HISTORY — PX: APPLICATION OF WOUND VAC: SHX5189

## 2020-02-05 LAB — BASIC METABOLIC PANEL
Anion gap: 8 (ref 5–15)
BUN: 17 mg/dL (ref 8–23)
CO2: 27 mmol/L (ref 22–32)
Calcium: 8.3 mg/dL — ABNORMAL LOW (ref 8.9–10.3)
Chloride: 102 mmol/L (ref 98–111)
Creatinine, Ser: 0.93 mg/dL (ref 0.44–1.00)
GFR calc Af Amer: >60 mL/min (ref >60)
GFR calc non Af Amer: >60 mL/min (ref >60)
Glucose, Bld: 185 mg/dL — ABNORMAL HIGH (ref 70–99)
Potassium: 3.6 mmol/L (ref 3.5–5.1)
Sodium: 137 mmol/L (ref 135–145)

## 2020-02-05 LAB — CBC
HCT: 36.5 % (ref 36.0–46.0)
Hemoglobin: 11.9 g/dL — ABNORMAL LOW (ref 12.0–15.0)
MCH: 27.8 pg (ref 26.0–34.0)
MCHC: 32.6 g/dL (ref 30.0–36.0)
MCV: 85.3 fL (ref 80.0–100.0)
Platelets: 199 10*3/uL (ref 150–400)
RBC: 4.28 MIL/uL (ref 3.87–5.11)
RDW: 13.2 % (ref 11.5–15.5)
WBC: 11.5 10*3/uL — ABNORMAL HIGH (ref 4.0–10.5)
nRBC: 0 % (ref 0.0–0.2)

## 2020-02-05 LAB — ABO/RH: ABO/RH(D): O POS

## 2020-02-05 SURGERY — ARTHROPLASTY, KNEE, TOTAL
Anesthesia: Spinal | Site: Knee | Laterality: Right

## 2020-02-05 MED ORDER — TRANEXAMIC ACID-NACL 1000-0.7 MG/100ML-% IV SOLN: 1000.0000 mg | Freq: Once | INTRAVENOUS | Status: AC

## 2020-02-05 MED ORDER — HYDROCHLOROTHIAZIDE 25 MG PO TABS
12.5000 mg | ORAL_TABLET | Freq: Every day | ORAL | Status: DC
  Administered 2020-02-06 – 2020-02-08 (×3): 12.5 mg via ORAL
  Filled 2020-02-05 (×4): qty 1

## 2020-02-05 MED ORDER — TRAMADOL HCL 50 MG PO TABS
ORAL_TABLET | ORAL | Status: AC
  Filled 2020-02-05: qty 1

## 2020-02-05 MED ORDER — ZOLPIDEM TARTRATE 5 MG PO TABS
5.0000 mg | ORAL_TABLET | Freq: Every evening | ORAL | Status: DC | PRN
  Filled 2020-02-05: qty 1

## 2020-02-05 MED ORDER — BISACODYL 10 MG RE SUPP
10.0000 mg | Freq: Every day | RECTAL | Status: DC | PRN
  Administered 2020-02-07: 10 mg via RECTAL
  Filled 2020-02-05 (×2): qty 1

## 2020-02-05 MED ORDER — DIPHENHYDRAMINE HCL 12.5 MG/5ML PO ELIX
12.5000 mg | ORAL_SOLUTION | ORAL | Status: DC | PRN
  Filled 2020-02-05: qty 10

## 2020-02-05 MED ORDER — OXYCODONE HCL 5 MG PO TABS
10.0000 mg | ORAL_TABLET | ORAL | Status: DC | PRN
  Administered 2020-02-05 – 2020-02-07 (×4): 10 mg via ORAL
  Filled 2020-02-05: qty 2
  Filled 2020-02-05: qty 3
  Filled 2020-02-05: qty 2

## 2020-02-05 MED ORDER — MIDAZOLAM HCL 5 MG/5ML IJ SOLN
INTRAMUSCULAR | Status: DC | PRN
  Administered 2020-02-05 (×2): 1 mg via INTRAVENOUS

## 2020-02-05 MED ORDER — FENTANYL CITRATE (PF) 100 MCG/2ML IJ SOLN
INTRAMUSCULAR | Status: AC
  Administered 2020-02-05: 25 ug via INTRAVENOUS
  Filled 2020-02-05: qty 2

## 2020-02-05 MED ORDER — PHENYLEPHRINE HCL (PRESSORS) 10 MG/ML IV SOLN
INTRAVENOUS | Status: DC | PRN
  Administered 2020-02-05: 100 ug via INTRAVENOUS

## 2020-02-05 MED ORDER — SODIUM CHLORIDE 0.9 % IV SOLN
INTRAVENOUS | Status: DC | PRN
  Administered 2020-02-05: 10 ug/min via INTRAVENOUS

## 2020-02-05 MED ORDER — BUPIVACAINE LIPOSOME 1.3 % IJ SUSP
INTRAMUSCULAR | Status: AC
  Filled 2020-02-05: qty 40

## 2020-02-05 MED ORDER — LACTATED RINGERS IV SOLN: INTRAVENOUS | Status: DC | PRN

## 2020-02-05 MED ORDER — DOCUSATE SODIUM 100 MG PO CAPS
100.0000 mg | ORAL_CAPSULE | Freq: Two times a day (BID) | ORAL | Status: DC
  Administered 2020-02-05 – 2020-02-08 (×6): 100 mg via ORAL
  Filled 2020-02-05 (×6): qty 1

## 2020-02-05 MED ORDER — MIDAZOLAM HCL 2 MG/2ML IJ SOLN
INTRAMUSCULAR | Status: AC
  Filled 2020-02-05: qty 2

## 2020-02-05 MED ORDER — CEFAZOLIN SODIUM-DEXTROSE 2-4 GM/100ML-% IV SOLN
INTRAVENOUS | Status: AC
  Filled 2020-02-05: qty 100

## 2020-02-05 MED ORDER — SUCCINYLCHOLINE CHLORIDE 200 MG/10ML IV SOSY
PREFILLED_SYRINGE | INTRAVENOUS | Status: AC
  Filled 2020-02-05: qty 10

## 2020-02-05 MED ORDER — ROCURONIUM BROMIDE 10 MG/ML (PF) SYRINGE
PREFILLED_SYRINGE | INTRAVENOUS | Status: AC
  Filled 2020-02-05: qty 10

## 2020-02-05 MED ORDER — NEOMYCIN-POLYMYXIN B GU 40-200000 IR SOLN
Status: DC | PRN
  Administered 2020-02-05: 16 mL

## 2020-02-05 MED ORDER — ONDANSETRON HCL 4 MG/2ML IJ SOLN
INTRAMUSCULAR | Status: DC | PRN
  Administered 2020-02-05: 4 mg via INTRAVENOUS

## 2020-02-05 MED ORDER — BUPIVACAINE HCL (PF) 0.5 % IJ SOLN
INTRAMUSCULAR | Status: DC | PRN
  Administered 2020-02-05: 2.8 mL via INTRATHECAL

## 2020-02-05 MED ORDER — PROPOFOL 500 MG/50ML IV EMUL
INTRAVENOUS | Status: AC
  Filled 2020-02-05: qty 50

## 2020-02-05 MED ORDER — ENOXAPARIN SODIUM 40 MG/0.4ML ~~LOC~~ SOLN
30.0000 mg | Freq: Two times a day (BID) | SUBCUTANEOUS | Status: DC
  Administered 2020-02-06: 30 mg via SUBCUTANEOUS
  Filled 2020-02-05 (×3): qty 0.4

## 2020-02-05 MED ORDER — SODIUM CHLORIDE FLUSH 0.9 % IV SOLN
INTRAVENOUS | Status: AC
  Filled 2020-02-05: qty 20

## 2020-02-05 MED ORDER — ONDANSETRON HCL 4 MG/2ML IJ SOLN
INTRAMUSCULAR | Status: AC
  Filled 2020-02-05: qty 2

## 2020-02-05 MED ORDER — PANTOPRAZOLE SODIUM 40 MG PO TBEC
40.0000 mg | DELAYED_RELEASE_TABLET | Freq: Every day | ORAL | Status: DC
  Administered 2020-02-06 – 2020-02-08 (×3): 40 mg via ORAL
  Filled 2020-02-05 (×5): qty 1

## 2020-02-05 MED ORDER — OXYCODONE HCL 5 MG PO TABS
5.0000 mg | ORAL_TABLET | ORAL | Status: DC | PRN
  Administered 2020-02-06: 5 mg via ORAL
  Administered 2020-02-06 (×2): 10 mg via ORAL
  Administered 2020-02-07: 5 mg via ORAL
  Filled 2020-02-05 (×5): qty 2
  Filled 2020-02-05 (×2): qty 1
  Filled 2020-02-05 (×2): qty 2

## 2020-02-05 MED ORDER — MORPHINE SULFATE 10 MG/ML IJ SOLN
INTRAMUSCULAR | Status: DC | PRN
  Administered 2020-02-05: 10 mg via INTRAVENOUS

## 2020-02-05 MED ORDER — METHOCARBAMOL 500 MG PO TABS
ORAL_TABLET | ORAL | Status: AC
  Filled 2020-02-05: qty 1

## 2020-02-05 MED ORDER — DEXAMETHASONE SODIUM PHOSPHATE 10 MG/ML IJ SOLN
INTRAMUSCULAR | Status: AC
  Filled 2020-02-05: qty 1

## 2020-02-05 MED ORDER — LACTATED RINGERS IV SOLN: INTRAVENOUS | Status: DC

## 2020-02-05 MED ORDER — VENLAFAXINE HCL ER 75 MG PO CP24
225.0000 mg | ORAL_CAPSULE | Freq: Every day | ORAL | Status: DC
  Administered 2020-02-06 – 2020-02-08 (×3): 225 mg via ORAL
  Filled 2020-02-05 (×3): qty 1

## 2020-02-05 MED ORDER — PROPOFOL 10 MG/ML IV BOLUS
INTRAVENOUS | Status: DC | PRN
Start: 2020-02-05 — End: 2020-02-05
  Administered 2020-02-05: 30 mg via INTRAVENOUS

## 2020-02-05 MED ORDER — ALUM & MAG HYDROXIDE-SIMETH 200-200-20 MG/5ML PO SUSP: 30.0000 mL | ORAL | Status: DC | PRN

## 2020-02-05 MED ORDER — CEFAZOLIN SODIUM-DEXTROSE 2-4 GM/100ML-% IV SOLN
2.0000 g | Freq: Four times a day (QID) | INTRAVENOUS | Status: AC
  Administered 2020-02-05 (×2): 2 g via INTRAVENOUS
  Filled 2020-02-05 (×2): qty 100

## 2020-02-05 MED ORDER — ACETAMINOPHEN 10 MG/ML IV SOLN
INTRAVENOUS | Status: AC
  Filled 2020-02-05: qty 100

## 2020-02-05 MED ORDER — ORAL CARE MOUTH RINSE: 15.0000 mL | Freq: Once | OROMUCOSAL | Status: AC

## 2020-02-05 MED ORDER — OXYCODONE HCL 5 MG PO TABS
ORAL_TABLET | ORAL | Status: AC
  Administered 2020-02-05: 5 mg via ORAL
  Filled 2020-02-05: qty 1

## 2020-02-05 MED ORDER — SODIUM CHLORIDE 0.9 % IV SOLN
INTRAVENOUS | Status: DC | PRN
  Administered 2020-02-05: 60 mL

## 2020-02-05 MED ORDER — METHOCARBAMOL 500 MG PO TABS
500.0000 mg | ORAL_TABLET | Freq: Four times a day (QID) | ORAL | Status: DC | PRN
  Administered 2020-02-05 – 2020-02-07 (×5): 500 mg via ORAL
  Filled 2020-02-05 (×4): qty 1

## 2020-02-05 MED ORDER — HYDROMORPHONE HCL 1 MG/ML IJ SOLN: 0.5000 mg | INTRAMUSCULAR | Status: DC | PRN

## 2020-02-05 MED ORDER — MAGNESIUM HYDROXIDE 400 MG/5ML PO SUSP
30.0000 mL | Freq: Every day | ORAL | Status: DC | PRN
  Filled 2020-02-05: qty 30

## 2020-02-05 MED ORDER — METHOCARBAMOL 1000 MG/10ML IJ SOLN
500.0000 mg | Freq: Four times a day (QID) | INTRAVENOUS | Status: DC | PRN
  Filled 2020-02-05: qty 5

## 2020-02-05 MED ORDER — CHLORHEXIDINE GLUCONATE 0.12 % MT SOLN: 15.0000 mL | Freq: Once | OROMUCOSAL | Status: AC

## 2020-02-05 MED ORDER — ONDANSETRON HCL 4 MG/2ML IJ SOLN: 4.0000 mg | Freq: Once | INTRAMUSCULAR | Status: DC | PRN

## 2020-02-05 MED ORDER — TRAMADOL HCL 50 MG PO TABS
50.0000 mg | ORAL_TABLET | Freq: Four times a day (QID) | ORAL | Status: DC
  Administered 2020-02-05 – 2020-02-08 (×12): 50 mg via ORAL
  Filled 2020-02-05 (×12): qty 1

## 2020-02-05 MED ORDER — FLUTICASONE PROPIONATE 50 MCG/ACT NA SUSP
1.0000 | Freq: Every day | NASAL | Status: DC
  Administered 2020-02-06 – 2020-02-08 (×3): 1 via NASAL
  Filled 2020-02-05 (×2): qty 16

## 2020-02-05 MED ORDER — PRAMIPEXOLE DIHYDROCHLORIDE 1 MG PO TABS
1.5000 mg | ORAL_TABLET | Freq: Every day | ORAL | Status: DC
  Administered 2020-02-05 – 2020-02-07 (×3): 1.5 mg via ORAL
  Filled 2020-02-05 (×4): qty 2

## 2020-02-05 MED ORDER — ONDANSETRON HCL 4 MG PO TABS: 4.0000 mg | ORAL_TABLET | Freq: Four times a day (QID) | ORAL | Status: DC | PRN

## 2020-02-05 MED ORDER — DEXAMETHASONE SODIUM PHOSPHATE 10 MG/ML IJ SOLN
INTRAMUSCULAR | Status: DC | PRN
Start: 2020-02-05 — End: 2020-02-05
  Administered 2020-02-05: 10 mg via INTRAVENOUS

## 2020-02-05 MED ORDER — CHLORHEXIDINE GLUCONATE 0.12 % MT SOLN
OROMUCOSAL | Status: AC
  Administered 2020-02-05: 15 mL via OROMUCOSAL
  Filled 2020-02-05: qty 15

## 2020-02-05 MED ORDER — MAGNESIUM CITRATE PO SOLN
1.0000 | Freq: Once | ORAL | Status: AC | PRN
  Administered 2020-02-07: 1 via ORAL
  Filled 2020-02-05 (×2): qty 296

## 2020-02-05 MED ORDER — FENTANYL CITRATE (PF) 100 MCG/2ML IJ SOLN
25.0000 ug | INTRAMUSCULAR | Status: DC | PRN
  Administered 2020-02-05 (×3): 25 ug via INTRAVENOUS

## 2020-02-05 MED ORDER — SODIUM CHLORIDE 0.9 % IV SOLN: INTRAVENOUS | Status: DC

## 2020-02-05 MED ORDER — PHENOL 1.4 % MT LIQD
1.0000 | OROMUCOSAL | Status: DC | PRN
  Filled 2020-02-05: qty 177

## 2020-02-05 MED ORDER — METOCLOPRAMIDE HCL 10 MG PO TABS: 5.0000 mg | ORAL_TABLET | Freq: Three times a day (TID) | ORAL | Status: DC | PRN

## 2020-02-05 MED ORDER — BUPIVACAINE HCL (PF) 0.25 % IJ SOLN
INTRAMUSCULAR | Status: AC
  Filled 2020-02-05: qty 60

## 2020-02-05 MED ORDER — PROPOFOL 500 MG/50ML IV EMUL
INTRAVENOUS | Status: DC | PRN
  Administered 2020-02-05: 50 ug/kg/min via INTRAVENOUS

## 2020-02-05 MED ORDER — ACETAMINOPHEN 325 MG PO TABS
325.0000 mg | ORAL_TABLET | Freq: Four times a day (QID) | ORAL | Status: DC | PRN
  Administered 2020-02-06: 650 mg via ORAL
  Filled 2020-02-05: qty 2

## 2020-02-05 MED ORDER — GLYCOPYRROLATE 0.2 MG/ML IJ SOLN
INTRAMUSCULAR | Status: AC
  Filled 2020-02-05: qty 1

## 2020-02-05 MED ORDER — FENTANYL CITRATE (PF) 100 MCG/2ML IJ SOLN
INTRAMUSCULAR | Status: DC | PRN
  Administered 2020-02-05 (×2): 25 ug via INTRAVENOUS
  Administered 2020-02-05: 50 ug via INTRAVENOUS

## 2020-02-05 MED ORDER — ALBUTEROL SULFATE (2.5 MG/3ML) 0.083% IN NEBU
3.0000 mL | INHALATION_SOLUTION | Freq: Four times a day (QID) | RESPIRATORY_TRACT | Status: DC | PRN
  Filled 2020-02-05: qty 3

## 2020-02-05 MED ORDER — BUDESONIDE 0.5 MG/2ML IN SUSP
0.5000 mg | Freq: Four times a day (QID) | RESPIRATORY_TRACT | Status: DC | PRN
  Filled 2020-02-05: qty 2

## 2020-02-05 MED ORDER — PROPOFOL 10 MG/ML IV BOLUS
INTRAVENOUS | Status: AC
  Filled 2020-02-05: qty 20

## 2020-02-05 MED ORDER — MORPHINE SULFATE (PF) 10 MG/ML IV SOLN
INTRAVENOUS | Status: AC
  Filled 2020-02-05: qty 2

## 2020-02-05 MED ORDER — FENTANYL CITRATE (PF) 100 MCG/2ML IJ SOLN
INTRAMUSCULAR | Status: AC
  Filled 2020-02-05: qty 2

## 2020-02-05 MED ORDER — LIDOCAINE HCL (PF) 2 % IJ SOLN
INTRAMUSCULAR | Status: AC
  Filled 2020-02-05: qty 5

## 2020-02-05 MED ORDER — MONTELUKAST SODIUM 10 MG PO TABS
10.0000 mg | ORAL_TABLET | Freq: Every day | ORAL | Status: DC
  Administered 2020-02-05 – 2020-02-07 (×3): 10 mg via ORAL
  Filled 2020-02-05 (×3): qty 1

## 2020-02-05 MED ORDER — SODIUM CHLORIDE FLUSH 0.9 % IV SOLN
INTRAVENOUS | Status: AC
  Filled 2020-02-05: qty 40

## 2020-02-05 MED ORDER — BUPIVACAINE-EPINEPHRINE (PF) 0.25% -1:200000 IJ SOLN
INTRAMUSCULAR | Status: DC | PRN
  Administered 2020-02-05: 30 mL via PERINEURAL

## 2020-02-05 MED ORDER — PROPOFOL 500 MG/50ML IV EMUL
INTRAVENOUS | Status: AC
  Filled 2020-02-05: qty 100

## 2020-02-05 MED ORDER — ACETAMINOPHEN 10 MG/ML IV SOLN
INTRAVENOUS | Status: DC | PRN
  Administered 2020-02-05: 1000 mg via INTRAVENOUS

## 2020-02-05 MED ORDER — CEFAZOLIN SODIUM-DEXTROSE 2-4 GM/100ML-% IV SOLN
2.0000 g | INTRAVENOUS | Status: AC
  Administered 2020-02-05: 2 g via INTRAVENOUS

## 2020-02-05 MED ORDER — MENTHOL 3 MG MT LOZG
1.0000 | LOZENGE | OROMUCOSAL | Status: DC | PRN
  Filled 2020-02-05: qty 9

## 2020-02-05 MED ORDER — ACETAMINOPHEN 500 MG PO TABS
1000.0000 mg | ORAL_TABLET | Freq: Four times a day (QID) | ORAL | Status: AC
  Administered 2020-02-05 – 2020-02-06 (×3): 1000 mg via ORAL
  Filled 2020-02-05 (×3): qty 2

## 2020-02-05 MED ORDER — ONDANSETRON HCL 4 MG/2ML IJ SOLN: 4.0000 mg | Freq: Four times a day (QID) | INTRAMUSCULAR | Status: DC | PRN

## 2020-02-05 MED ORDER — GLYCOPYRROLATE 0.2 MG/ML IJ SOLN
INTRAMUSCULAR | Status: DC | PRN
  Administered 2020-02-05: .2 mg via INTRAVENOUS

## 2020-02-05 MED ORDER — METOCLOPRAMIDE HCL 5 MG/ML IJ SOLN: 5.0000 mg | Freq: Three times a day (TID) | INTRAMUSCULAR | Status: DC | PRN

## 2020-02-05 MED ORDER — TRANEXAMIC ACID-NACL 1000-0.7 MG/100ML-% IV SOLN
INTRAVENOUS | Status: AC
  Administered 2020-02-05: 1000 mg via INTRAVENOUS
  Filled 2020-02-05: qty 100

## 2020-02-05 SURGICAL SUPPLY — 71 items
BLADE SAGITTAL 25.0X1.19X90 (BLADE) ×2 IMPLANT
BLOCK CUTTING FEMUR 5 RT MED (MISCELLANEOUS) ×2 IMPLANT
BLOCK CUTTING TIBIAL 4 RT (MISCELLANEOUS) ×2 IMPLANT
BLOCK CUTTING TIBIAL 4 RT MIS (MISCELLANEOUS) ×2 IMPLANT
BNDG ELASTIC 6X5.8 VLCR STR LF (GAUZE/BANDAGES/DRESSINGS) ×2 IMPLANT
CANISTER SUCT 1200ML W/VALVE (MISCELLANEOUS) ×2 IMPLANT
CANISTER SUCT 3000ML PPV (MISCELLANEOUS) ×4 IMPLANT
CANISTER WOUND CARE 500ML ATS (WOUND CARE) ×2 IMPLANT
CEMENT HV SMART SET (Cement) ×4 IMPLANT
CHLORAPREP W/TINT 26 (MISCELLANEOUS) ×4 IMPLANT
COOLER POLAR GLACIER W/PUMP (MISCELLANEOUS) ×2 IMPLANT
COVER WAND RF STERILE (DRAPES) ×2 IMPLANT
CUFF TOURN SGL QUICK 34 (TOURNIQUET CUFF) ×1
CUFF TRNQT CYL 34X4.125X (TOURNIQUET CUFF) ×1 IMPLANT
DRAPE 3/4 80X56 (DRAPES) ×4 IMPLANT
ELECT CAUTERY BLADE 6.4 (BLADE) ×2 IMPLANT
ELECT REM PT RETURN 9FT ADLT (ELECTROSURGICAL) ×2
ELECTRODE REM PT RTRN 9FT ADLT (ELECTROSURGICAL) ×1 IMPLANT
FEMORAL COMP CEMENTED SZ5 (Femur) ×2 IMPLANT
FEMUR BONE MODEL (MISCELLANEOUS) ×2 IMPLANT
GAUZE SPONGE 4X4 12PLY STRL (GAUZE/BANDAGES/DRESSINGS) ×2 IMPLANT
GAUZE XEROFORM 1X8 LF (GAUZE/BANDAGES/DRESSINGS) ×2 IMPLANT
GLOVE BIOGEL PI IND STRL 9 (GLOVE) ×1 IMPLANT
GLOVE BIOGEL PI INDICATOR 9 (GLOVE) ×1
GLOVE INDICATOR 8.0 STRL GRN (GLOVE) ×2 IMPLANT
GLOVE SURG ORTHO 8.0 STRL STRW (GLOVE) ×2 IMPLANT
GLOVE SURG SYN 9.0  PF PI (GLOVE) ×2
GLOVE SURG SYN 9.0 PF PI (GLOVE) ×2 IMPLANT
GOWN SRG 2XL LVL 4 RGLN SLV (GOWNS) ×1 IMPLANT
GOWN STRL NON-REIN 2XL LVL4 (GOWNS) ×1
GOWN STRL REUS W/ TWL LRG LVL3 (GOWN DISPOSABLE) ×1 IMPLANT
GOWN STRL REUS W/ TWL XL LVL3 (GOWN DISPOSABLE) ×1 IMPLANT
GOWN STRL REUS W/TWL LRG LVL3 (GOWN DISPOSABLE) ×1
GOWN STRL REUS W/TWL XL LVL3 (GOWN DISPOSABLE) ×1
HOLDER FOLEY CATH W/STRAP (MISCELLANEOUS) ×2 IMPLANT
HOOD PEEL AWAY FLYTE STAYCOOL (MISCELLANEOUS) ×4 IMPLANT
INSERT TIBIAL SZ4 RIGHT 10MM (Insert) ×2 IMPLANT
KIT PREVENA INCISION MGT20CM45 (CANNISTER) ×2 IMPLANT
KIT TURNOVER KIT A (KITS) ×2 IMPLANT
NDL SAFETY ECLIPSE 18X1.5 (NEEDLE) ×1 IMPLANT
NEEDLE HYPO 18GX1.5 SHARP (NEEDLE) ×1
NEEDLE SPNL 18GX3.5 QUINCKE PK (NEEDLE) ×2 IMPLANT
NEEDLE SPNL 20GX3.5 QUINCKE YW (NEEDLE) ×2 IMPLANT
NS IRRIG 1000ML POUR BTL (IV SOLUTION) ×2 IMPLANT
PACK TOTAL KNEE (MISCELLANEOUS) ×2 IMPLANT
PAD WRAPON POLAR KNEE (MISCELLANEOUS) ×1 IMPLANT
PAD WRAPON POLOR MULTI XL (MISCELLANEOUS) ×1 IMPLANT
PATELLA RESURFACING MEDACTA 02 (Bone Implant) ×2 IMPLANT
PENCIL SMOKE EVACUATOR COATED (MISCELLANEOUS) ×2 IMPLANT
PULSAVAC PLUS IRRIG FAN TIP (DISPOSABLE) ×2
SCALPEL PROTECTED #10 DISP (BLADE) ×4 IMPLANT
SOL .9 NS 3000ML IRR  AL (IV SOLUTION) ×1
SOL .9 NS 3000ML IRR UROMATIC (IV SOLUTION) ×1 IMPLANT
STAPLER SKIN PROX 35W (STAPLE) ×2 IMPLANT
STEM EXTENSION 11MMX30MM (Stem) ×2 IMPLANT
SUCTION FRAZIER HANDLE 10FR (MISCELLANEOUS) ×1
SUCTION TUBE FRAZIER 10FR DISP (MISCELLANEOUS) ×1 IMPLANT
SUT DVC 2 QUILL PDO  T11 36X36 (SUTURE) ×1
SUT DVC 2 QUILL PDO T11 36X36 (SUTURE) ×1 IMPLANT
SUT ETHIBOND 2 V 37 (SUTURE) IMPLANT
SUT V-LOC 90 ABS DVC 3-0 CL (SUTURE) ×2 IMPLANT
SYR 20ML LL LF (SYRINGE) ×2 IMPLANT
SYR 50ML LL SCALE MARK (SYRINGE) ×4 IMPLANT
TIBIAL TRAY FIXED 4 MEDACTA 02 (Joint) ×2 IMPLANT
TIP FAN IRRIG PULSAVAC PLUS (DISPOSABLE) ×1 IMPLANT
TOWEL OR 17X26 4PK STRL BLUE (TOWEL DISPOSABLE) ×2 IMPLANT
TOWER CARTRIDGE SMART MIX (DISPOSABLE) ×4 IMPLANT
TRAY FOLEY MTR SLVR 16FR STAT (SET/KITS/TRAYS/PACK) ×2 IMPLANT
WRAP-ON POLOR PAD MULTI XL (MISCELLANEOUS) ×1
WRAPON POLAR PAD KNEE (MISCELLANEOUS) ×2
WRAPON POLOR PAD MULTI XL (MISCELLANEOUS) ×1

## 2020-02-05 NOTE — Anesthesia Preprocedure Evaluation (Signed)
Anesthesia Evaluation  Patient identified by MRN, date of birth, ID band Patient awake    Reviewed: Allergy & Precautions, NPO status , Patient's Chart, lab work & pertinent test results  History of Anesthesia Complications Negative for: history of anesthetic complications  Airway Mallampati: II  TM Distance: >3 FB Neck ROM: Full    Dental no notable dental hx.    Pulmonary asthma , sleep apnea (did not tolerate CPAP) ,    breath sounds clear to auscultation- rhonchi (-) wheezing      Cardiovascular Exercise Tolerance: Good hypertension, Pt. on medications (-) CAD, (-) Past MI, (-) Cardiac Stents and (-) CABG + Valvular Problems/Murmurs MR  Rhythm:Regular Rate:Normal - Systolic murmurs and - Diastolic murmurs Echo 11/07/18: 1. The left ventricle has normal systolic function with an ejection  fraction of 60-65%. The cavity size was normal. Left ventricular diastolic  parameters were normal.  2. The right ventricle has normal systolic function. The cavity was  normal. There is no increase in right ventricular wall thickness. Right  ventricular systolic pressure is mildly elevated with an estimated  pressure of 35.8 mmHg.  3. Left atrial size was moderate to severely dilated.  4. Mitral valve regurgitation is moderate    Neuro/Psych neg Seizures PSYCHIATRIC DISORDERS Depression negative neurological ROS     GI/Hepatic Neg liver ROS, hiatal hernia, GERD  ,  Endo/Other  negative endocrine ROSneg diabetes  Renal/GU negative Renal ROS     Musculoskeletal  (+) Arthritis ,   Abdominal (+) + obese,   Peds  Hematology  (+) anemia ,   Anesthesia Other Findings Past Medical History: No date: Arthritis No date: Asthma No date: Chronic cough No date: Colon polyps No date: Depression No date: GERD (gastroesophageal reflux disease) No date: Hiatal hernia     Comment:  LARGE No date: History of chicken pox No date:  Hypertension No date: Macular degeneration No date: Multinodular goiter     Comment:  FNA in past neg h/o thyroid cysts No date: Pernicious anemia     Comment:  PERNICIOUS  No date: RLS (restless legs syndrome) No date: Sleep apnea     Comment:  NO CPAP   Reproductive/Obstetrics                             Lab Results  Component Value Date   WBC 5.5 02/01/2020   HGB 12.4 02/01/2020   HCT 38.6 02/01/2020   MCV 86.5 02/01/2020   PLT 220 02/01/2020    Anesthesia Physical Anesthesia Plan  ASA: III  Anesthesia Plan: Spinal   Post-op Pain Management:    Induction:   PONV Risk Score and Plan: 2 and Propofol infusion  Airway Management Planned: Natural Airway  Additional Equipment:   Intra-op Plan:   Post-operative Plan:   Informed Consent: I have reviewed the patients History and Physical, chart, labs and discussed the procedure including the risks, benefits and alternatives for the proposed anesthesia with the patient or authorized representative who has indicated his/her understanding and acceptance.     Dental advisory given  Plan Discussed with: CRNA and Anesthesiologist  Anesthesia Plan Comments:         Anesthesia Quick Evaluation

## 2020-02-05 NOTE — Transfer of Care (Signed)
Immediate Anesthesia Transfer of Care Note  Patient: Sharon Cervantes  Procedure(s) Performed: RIGHT TOTAL KNEE ARTHROPLASTY (Right Knee) APPLICATION OF WOUND VAC (Right Knee)  Patient Location: PACU  Anesthesia Type:Spinal  Level of Consciousness: awake, alert , oriented and patient cooperative  Airway & Oxygen Therapy: Patient Spontanous Breathing and Patient connected to face mask oxygen  Post-op Assessment: Report given to RN and Post -op Vital signs reviewed and stable  Post vital signs: Reviewed and stable  Last Vitals:  Vitals Value Taken Time  BP 90/51 02/05/20 0922  Temp    Pulse 96 02/05/20 0924  Resp 20 02/05/20 0924  SpO2 94 % 02/05/20 0924  Vitals shown include unvalidated device data.  Last Pain:  Vitals:   02/05/20 0606  TempSrc: Temporal  PainSc: 0-No pain         Complications: No apparent anesthesia complications

## 2020-02-05 NOTE — Anesthesia Procedure Notes (Signed)
Spinal  Patient location during procedure: OR Start time: 02/05/2020 7:20 AM End time: 02/05/2020 7:27 AM Staffing Performed: anesthesiologist  Anesthesiologist: Alver Fisher, MD Preanesthetic Checklist Completed: patient identified, IV checked, site marked, risks and benefits discussed, surgical consent, monitors and equipment checked, pre-op evaluation and timeout performed Spinal Block Patient position: sitting Prep: ChloraPrep Patient monitoring: heart rate, continuous pulse ox and blood pressure Approach: midline Location: L3-4 Injection technique: single-shot Needle Needle type: Introducer and Pencil-Tip  Needle gauge: 24 G Needle length: 9 cm Assessment Sensory level: T4

## 2020-02-05 NOTE — Anesthesia Procedure Notes (Signed)
Procedure Name: General with mask airway Performed by: Fletcher-Harrison, Iola Turri, CRNA Pre-anesthesia Checklist: Patient identified, Emergency Drugs available, Suction available and Patient being monitored Patient Re-evaluated:Patient Re-evaluated prior to induction Oxygen Delivery Method: Simple face mask Ventilation: Oral airway inserted - appropriate to patient size Dental Injury: Teeth and Oropharynx as per pre-operative assessment        

## 2020-02-05 NOTE — Op Note (Signed)
Date February 05, 2019  Time 9:20 AM   PATIENT:   Sharon Cervantes   PRE-OPERATIVE DIAGNOSIS:  Primary localized osteoarthritis of right knee   POST-OPERATIVE DIAGNOSIS:  Same   PROCEDURE:  Procedure(s): Right TOTAL KNEE ARTHROPLASTY   SURGEON: Leitha Schuller, MD   ASSISTANTS: Cranston Neighbor, PA-C   ANESTHESIA:   spinal   EBL:     BLOOD ADMINISTERED:none   DRAINS: Incisional wound VAC    LOCAL MEDICATIONS USED:  MARCAINE    and OTHER Exparel and morphine   SPECIMEN:  No Specimen   DISPOSITION OF SPECIMEN:  N/A   COUNTS:  YES   TOURNIQUET:   60 minutes at 300 mm Hg   IMPLANTS: Medacta  GMK sphere system with  right 5 femur, right 4 tibia with short stem and  10 mm insert.  Size  2 patella, all components cemented.   DICTATION: Reubin Milan Dictation   patient was brought to the operating room and spinal anesthesia was obtained.  After prepping and draping the  right leg in sterile fashion, and after patient identification and timeout procedures were completed, tourniquet was raised  and midline skin incision was made followed by medial parapatellar arthrotomy with  severe medial compartment osteoarthritis, severe patellofemoral arthritis and  mild lateral compartment arthritis, partial synovectomy was also carried out.   The ACL and PCL and fat pad were excised along with anterior horns of the meniscus. The proximal tibia cutting guide from  the [facility] system was applied and the proximal tibia cut carried out.  The distal femoral cut was carried out in a similar fashion     The  5 femoral cutting guide applied with anterior posterior and chamfer cuts made.  The posterior horns of the menisci were removed at this point.   Injection of the above medication was carried out after the femoral and tibial cuts were carried out.  The  for baseplate trial was placed pinned into position and proximal tibial preparation carried out with drilling hand reaming and the keel punch followed by placement of the  5  femur and sizing the tibial insert size   10 millimeter gave the best fit with stability and full extension.  The distal femoral drill holes were made in the notch cut for the trochlear groove was then carried out with trials were then removed the patella was cut using the patellar cutting guide and it sized to a size  2 after drill holes have been made  The knee was irrigated with pulsatile lavage and the bony surfaces dried the tibial component was cemented into place first.  Excess cement was removed and the polyethylene insert placed with a torque screw placed with a torque screwdriver tightened.  The distal femoral component was placed and the knee was held in extension as the patellar button was clamped into place.  After the cement was set, excess cement was removed and the knee was again irrigated thoroughly thoroughly irrigated.  The tourniquet was let down and hemostasis checked with electrocautery. The arthrotomy was repaired with a heavy Quill suture,  followed by 3-0 V lock subcuticular closure, skin staples followed by incisional wound VAC and Polar Care.Marland Kitchen   PLAN OF CARE: Admit for overnight observation   PATIENT DISPOSITION:  PACU - hemodynamically stable.

## 2020-02-05 NOTE — Evaluation (Signed)
Physical Therapy Evaluation Patient Details Name: Sharon Cervantes MRN: [id number] DOB: 21-Mar-1951 Today's Date: 02/05/2020   History of Present Illness  Patient is a 69 year old female admitted for R TKA. PMH includes: HLD, HTN  Clinical Impression  Patient received in bed, pleasant, agreeable to PT session. Patient required min assist for bed mobility, min assist for sit to stand. Ambulation deferred at this time due to pain ( pain increased from 3/10 to 10/10 with activity). Min guard for LE bed exercises. Patient will continue to benefit from skilled PT while here to improve strength, ROM and functional independence for return home at discharge.       Follow Up Recommendations Home health PT;Supervision for mobility/OOB    Equipment Recommendations  Rolling walker with 5" wheels    Recommendations for Other Services       Precautions / Restrictions Precautions Precautions: Fall Restrictions Weight Bearing Restrictions: No Other Position/Activity Restrictions: WBAT      Mobility  Bed Mobility Overal bed mobility: Needs Assistance Bed Mobility: Supine to Sit;Sit to Supine     Supine to sit: Supervision Sit to supine: Min assist   General bed mobility comments: She was able to use B UEs to assist R LE off bed, required min assist from me to lift back onto bed  Transfers Overall transfer level: Needs assistance Equipment used: Rolling walker (2 wheeled) Transfers: Sit to/from Stand Sit to Stand: Min guard            Ambulation/Gait             General Gait Details: deferred at this time due to pain  Stairs            Wheelchair Mobility    Modified Rankin (Stroke Patients Only)       Balance Overall balance assessment: Needs assistance Sitting-balance support: Feet supported Sitting balance-Leahy Scale: Good     Standing balance support: Bilateral upper extremity supported;During functional activity Standing balance-Leahy Scale: Fair Standing  balance comment: reliant on B UE support with standing and supervision/min guard for safety                             Pertinent Vitals/Pain Pain Assessment: 0-10 Pain Score: 3  Pain Location: R knee at rest Pain Descriptors / Indicators: Sore;Discomfort Pain Intervention(s): Monitored during session;Repositioned    Home Living Family/patient expects to be discharged to:: Private residence Living Arrangements: Spouse/significant other Available Help at Discharge: Family Type of Home: House Home Access: Level entry     Home Layout: One level Home Equipment: Toilet riser      Prior Function Level of Independence: Independent with assistive device(s)         Comments: was using SPC just prior to admission     Hand Dominance        Extremity/Trunk Assessment   Upper Extremity Assessment Upper Extremity Assessment: Overall WFL for tasks assessed    Lower Extremity Assessment Lower Extremity Assessment: RLE deficits/detail RLE: Unable to fully assess due to pain RLE Sensation: WNL    Cervical / Trunk Assessment Cervical / Trunk Assessment: Normal  Communication   Communication: No difficulties  Cognition Arousal/Alertness: Awake/alert Behavior During Therapy: WFL for tasks assessed/performed Overall Cognitive Status: Within Functional Limits for tasks assessed  General Comments: patient very pleasant, joking, spouse present for assessment.      General Comments      Exercises Total Joint Exercises Ankle Circles/Pumps: AROM;15 reps;Both Quad Sets: AROM;Right;10 reps Heel Slides: AROM;Seated;Right;10 reps Hip ABduction/ADduction: AAROM;Supine;Right;10 reps Goniometric ROM: 8-70   Assessment/Plan    PT Assessment Patient needs continued PT services  PT Problem List Decreased strength;Decreased mobility;Decreased balance;Decreased knowledge of use of DME;Pain;Decreased activity tolerance;Decreased  range of motion       PT Treatment Interventions DME instruction;Therapeutic exercise;Gait training;Balance training;Neuromuscular re-education;Functional mobility training;Therapeutic activities;Patient/family education;Stair training    PT Goals (Current goals can be found in the Care Plan section)  Acute Rehab PT Goals Patient Stated Goal: to return home PT Goal Formulation: With patient Time For Goal Achievement: 02/12/20 Potential to Achieve Goals: Good    Frequency BID   Barriers to discharge        Co-evaluation               AM-PAC PT "6 Clicks" Mobility  Outcome Measure Help needed turning from your back to your side while in a flat bed without using bedrails?: A Little Help needed moving from lying on your back to sitting on the side of a flat bed without using bedrails?: A Little Help needed moving to and from a bed to a chair (including a wheelchair)?: A Little Help needed standing up from a chair using your arms (e.g., wheelchair or bedside chair)?: A Little Help needed to walk in hospital room?: A Lot Help needed climbing 3-5 steps with a railing? : A Lot 6 Click Score: 16    End of Session Equipment Utilized During Treatment: Gait belt Activity Tolerance: Patient tolerated treatment well;Patient limited by pain Patient left: in bed;with bed alarm set;with family/visitor present;with call bell/phone within reach;with SCD's reapplied Nurse Communication: Mobility status PT Visit Diagnosis: Muscle weakness (generalized) (M62.81);Difficulty in walking, not elsewhere classified (R26.2);Pain Pain - Right/Left: Right Pain - part of body: Knee    Time: 1430-1500 PT Time Calculation (min) (ACUTE ONLY): 30 min   Charges:   PT Evaluation $PT Eval Moderate Complexity: 1 Mod PT Treatments $Therapeutic Exercise: 8-22 mins        Satya Buttram, PT, GCS 02/05/20,3:16 PM

## 2020-02-06 DIAGNOSIS — Z7952 Long term (current) use of systemic steroids: Secondary | ICD-10-CM | POA: Diagnosis not present

## 2020-02-06 DIAGNOSIS — Z79899 Other long term (current) drug therapy: Secondary | ICD-10-CM | POA: Diagnosis not present

## 2020-02-06 DIAGNOSIS — Z6841 Body Mass Index (BMI) 40.0 and over, adult: Secondary | ICD-10-CM | POA: Diagnosis not present

## 2020-02-06 DIAGNOSIS — M1711 Unilateral primary osteoarthritis, right knee: Secondary | ICD-10-CM | POA: Diagnosis present

## 2020-02-06 DIAGNOSIS — F329 Major depressive disorder, single episode, unspecified: Secondary | ICD-10-CM | POA: Diagnosis present

## 2020-02-06 DIAGNOSIS — E785 Hyperlipidemia, unspecified: Secondary | ICD-10-CM | POA: Diagnosis present

## 2020-02-06 DIAGNOSIS — R05 Cough: Secondary | ICD-10-CM | POA: Diagnosis present

## 2020-02-06 DIAGNOSIS — Z8249 Family history of ischemic heart disease and other diseases of the circulatory system: Secondary | ICD-10-CM | POA: Diagnosis not present

## 2020-02-06 DIAGNOSIS — R011 Cardiac murmur, unspecified: Secondary | ICD-10-CM | POA: Diagnosis present

## 2020-02-06 DIAGNOSIS — K219 Gastro-esophageal reflux disease without esophagitis: Secondary | ICD-10-CM | POA: Diagnosis present

## 2020-02-06 DIAGNOSIS — G2581 Restless legs syndrome: Secondary | ICD-10-CM | POA: Diagnosis present

## 2020-02-06 DIAGNOSIS — H3530 Unspecified macular degeneration: Secondary | ICD-10-CM | POA: Diagnosis present

## 2020-02-06 DIAGNOSIS — D510 Vitamin B12 deficiency anemia due to intrinsic factor deficiency: Secondary | ICD-10-CM | POA: Diagnosis present

## 2020-02-06 DIAGNOSIS — I10 Essential (primary) hypertension: Secondary | ICD-10-CM | POA: Diagnosis present

## 2020-02-06 MED ORDER — MAGNESIUM HYDROXIDE 400 MG/5ML PO SUSP
30.0000 mL | Freq: Once | ORAL | Status: AC
  Administered 2020-02-06: 30 mL via ORAL
  Filled 2020-02-06: qty 30

## 2020-02-06 MED ORDER — ENOXAPARIN SODIUM 30 MG/0.3ML ~~LOC~~ SOLN
30.0000 mg | Freq: Two times a day (BID) | SUBCUTANEOUS | Status: DC
  Administered 2020-02-06 – 2020-02-08 (×4): 30 mg via SUBCUTANEOUS
  Filled 2020-02-06 (×4): qty 0.3

## 2020-02-06 NOTE — Progress Notes (Signed)
   Subjective: 1 Day Post-Op Procedure(s) (LRB): RIGHT TOTAL KNEE ARTHROPLASTY (Right) APPLICATION OF WOUND VAC (Right) Patient reports pain as mild.   Patient is well, and has had no acute complaints or problems Denies any CP, SOB, ABD pain. We will continue therapy today.  Plan is to go Home after hospital stay.  Objective: Vital signs in last 24 hours: Temp:  [97.5 F (36.4 C)-98.6 F (37 C)] 97.5 F (36.4 C) (06/09 0428) Pulse Rate:  [73-98] 73 (06/09 0428) Resp:  [14-29] 18 (06/09 0428) BP: (90-139)/(51-90) 126/84 (06/09 0428) SpO2:  [92 %-99 %] 99 % (06/09 0428) FiO2 (%):  [21 %] 21 % (06/08 0921)  Intake/Output from previous day: 06/08 0701 - 06/09 0700 In: 2900.8 [P.O.:780; I.V.:1920.8; IV Piggyback:200] Out: 3450 [Urine:3350; Blood:100] Intake/Output this shift: No intake/output data recorded.  Recent Labs    02/05/20 1249  HGB 11.9*   Recent Labs    02/05/20 1249  WBC 11.5*  RBC 4.28  HCT 36.5  PLT 199   Recent Labs    02/05/20 1249  NA 137  K 3.6  CL 102  CO2 27  BUN 17  CREATININE 0.93  GLUCOSE 185*  CALCIUM 8.3*   No results for input(s): LABPT, INR in the last 72 hours.  EXAM General - Patient is Alert, Appropriate and Oriented Extremity - Neurovascular intact Sensation intact distally Intact pulses distally Dorsiflexion/Plantar flexion intact No cellulitis present Compartment soft Dressing - dressing C/D/I and no drainage, prevena intact with out drainage Motor Function - intact, moving foot and toes well on exam.   Past Medical History:  Diagnosis Date  . Arthritis   . Asthma   . Chronic cough   . Colon polyps   . Depression   . GERD (gastroesophageal reflux disease)   . Hiatal hernia    LARGE  . History of chicken pox   . Hypertension   . Macular degeneration   . Multinodular goiter    FNA in past neg h/o thyroid cysts  . Pernicious anemia    PERNICIOUS   . RLS (restless legs syndrome)   . Sleep apnea    NO CPAP     Assessment/Plan:   1 Day Post-Op Procedure(s) (LRB): RIGHT TOTAL KNEE ARTHROPLASTY (Right) APPLICATION OF WOUND VAC (Right) Active Problems:   Status post total knee replacement using cement, right  Estimated body mass index is 41.27 kg/m as calculated from the following:   Height as of this encounter: 5\' 5"  (1.651 m).   Weight as of this encounter: 112.5 kg. Advance diet Up with therapy  Needs BM VSS Labs pending  CM to assist with discharge   DVT Prophylaxis - Lovenox, Foot Pumps and TED hose Weight-Bearing as tolerated to right leg   T. , PA-C [facility] 02/06/2020, 8:09 AM

## 2020-02-06 NOTE — TOC Initial Note (Signed)
Transition of Care [facility]) - Initial/Assessment Note    Patient Details  Name: Sharon Cervantes MRN: [id number] Date of Birth: 10-Sep-1950  Transition of Care [facility]) CM/SW Contact:    Su Hilt, RN Phone Number: 02/06/2020, 12:49 PM  Clinical Narrative:                  Met with the patient to discuss DC plan an needs She lives at home with her wife and she provides assistance and transportation, She will need a RW and a a 3 in 1, I notified Dasher with [employer] and they will bring into the room, She is [facility] with [facility],  She is up to date with PCP and can afford her medications, I sent a request to get the price of meds Expected Discharge Plan: [place] Barriers to Discharge: Continued Medical Work up   Patient Goals and CMS Choice Patient states their goals for this hospitalization and ongoing recovery are:: go home      Expected Discharge Plan and Services Expected Discharge Plan: [place]   Discharge Planning Services: CM Consult   Living arrangements for the past 2 months: Single Family Home                 DME Arranged: 3-N-1, Walker rolling DME Agency: [employer] Date DME Agency Contacted: 02/06/20 Time DME Agency Contacted: 83 Representative spoke with at DME Agency: Zack HH Arranged: PT, OT [place] Agency: [facility] (formerly [employer]) Date [place]: 02/06/20 Time [place]: 57 Representative spoke with at [place]: Helene Kelp  Prior Living Arrangements/Services Living arrangements for the past 2 months: [place] Lives with:: Spouse Patient language and need for interpreter reviewed:: Yes Do you feel safe going back to the place where you live?: Yes      Need for Family Participation in Patient Care: No (Comment) Care giver support system in place?: Yes (comment)   Criminal Activity/Legal Involvement Pertinent to Current Situation/Hospitalization: No - Comment as  needed  Activities of Daily Living Home Assistive Devices/Equipment: Cane (specify quad or straight), Eyeglasses ADL Screening (condition at time of admission) Patient's cognitive ability adequate to safely complete daily activities?: Yes Is the patient deaf or have difficulty hearing?: No Does the patient have difficulty seeing, even when wearing glasses/contacts?: No Does the patient have difficulty concentrating, remembering, or making decisions?: No Patient able to express need for assistance with ADLs?: Yes Does the patient have difficulty dressing or bathing?: No Independently performs ADLs?: Yes (appropriate for developmental age) Does the patient have difficulty walking or climbing stairs?: Yes Weakness of Legs: Right Weakness of Arms/Hands: None  Permission Sought/Granted   Permission granted to share information with : Yes, Verbal Permission Granted              Emotional Assessment Appearance:: Appears stated age Attitude/Demeanor/Rapport: Engaged Affect (typically observed): Appropriate Orientation: : Oriented to Situation, Oriented to  Time, Oriented to Place, Oriented to Self Alcohol / Substance Use: Not Applicable Psych Involvement: No (comment)  Admission diagnosis:  Status post total knee replacement using cement, right [Z96.651] Patient Active Problem List   Diagnosis Date Noted   Status post total knee replacement using cement, right 02/05/2020   Chronic cough 02/02/2019   Mitral valve regurgitation 11/19/2018   Cardiac murmur 11/19/2018   Vitamin D deficiency 10/12/2018   Essential hypertension 10/12/2018   Pernicious anemia 10/12/2018   Elevated alkaline phosphatase  level 10/12/2018   Hypercalcemia 10/12/2018   Asthma 10/12/2018   RLS (restless legs syndrome) 10/12/2018   Hyperlipidemia 10/12/2018   PCP:  Gale Journey, MD Pharmacy:   [facility] [place], [place] [address] [place]  [address] Phone: [phone] Fax: [phone]     Social Determinants of Health (SDOH) Interventions    Readmission Risk Interventions No flowsheet data found.

## 2020-02-06 NOTE — Anesthesia Postprocedure Evaluation (Signed)
Anesthesia Post Note  Patient: Sharon Cervantes  Procedure(s) Performed: RIGHT TOTAL KNEE ARTHROPLASTY (Right Knee) APPLICATION OF WOUND VAC (Right Knee)  Patient location during evaluation: Nursing Unit Anesthesia Type: Spinal Level of consciousness: oriented and awake and alert Pain management: pain level controlled Vital Signs Assessment: post-procedure vital signs reviewed and stable Respiratory status: spontaneous breathing and respiratory function stable Cardiovascular status: blood pressure returned to baseline and stable Postop Assessment: no headache, no backache, no apparent nausea or vomiting and patient able to bend at knees Anesthetic complications: no     Last Vitals:  Vitals:   02/06/20 0033 02/06/20 0428  BP: 119/79 126/84  Pulse: 77 73  Resp: 18 18  Temp: (!) 36.4 C (!) 36.4 C  SpO2: 95% 99%    Last Pain:  Vitals:   02/06/20 0428  TempSrc: Oral  PainSc:                  Sharon Cervantes

## 2020-02-06 NOTE — Plan of Care (Signed)

## 2020-02-06 NOTE — TOC Benefit Eligibility Note (Signed)
Transition of Care [facility]) Benefit Eligibility Note    Patient Details  Name: Sharon Cervantes MRN: [id number] Date of Birth: [birth year]   Medication/Dose: Enoxaparin 40 mg daily x 14 days  Covered?: Yes  Tier: Other(Tier 1)  Prescription Coverage Preferred Pharmacy: Any in-network  Spoke with Person/Company/Phone Number:: [place], [employer], [phone]  Co-Pay: $15 for Enoxaparin  Prior Approval: No  Deductible: (No deductible)  Additional Notes: Lovenox Copay is $85.00    Verita Schneiders Sydne Krahl Phone Number: 02/06/2020, 2:40 PM

## 2020-02-06 NOTE — Progress Notes (Signed)
Physical Therapy Treatment Patient Details Name: Sharon Cervantes MRN: [id number] DOB: 12-Aug-1951 Today's Date: 02/06/2020    History of Present Illness Patient is a 69 year old female admitted for R TKA. PMH includes: HLD, HTN    PT Comments    Patient received in bed, agrees to PT session. Reports she slept fair. Mild pain reported this morning. Patient is mod independent with bed mobility. Requires assist for lines and leads but no physical assist. She transfers with min assist from elevated surface. Ambulation with RW and min guard 20 feet. She will continue to benefit from skilled PT while here to improve strength, ROM of R knee and to improve functional independence for safe return home.       Follow Up Recommendations  Home health PT;Supervision - Intermittent;Supervision for mobility/OOB     Equipment Recommendations  Rolling walker with 5" wheels    Recommendations for Other Services       Precautions / Restrictions Precautions Precautions: Fall Restrictions Weight Bearing Restrictions: No RLE Weight Bearing: Weight bearing as tolerated    Mobility  Bed Mobility Overal bed mobility: Modified Independent Bed Mobility: Supine to Sit     Supine to sit: Modified independent (Device/Increase time)     General bed mobility comments: Assist for lines, leads, no physical assist provided  Transfers Overall transfer level: Needs assistance Equipment used: Rolling walker (2 wheeled) Transfers: Sit to/from Stand Sit to Stand: From elevated surface;Min assist         General transfer comment: able to stand from elevated bed with min A  Ambulation/Gait Ambulation/Gait assistance: Min guard Gait Distance (Feet): 20 Feet Assistive device: Rolling walker (2 wheeled) Gait Pattern/deviations: Step-to pattern;Decreased step length - right;Decreased step length - left Gait velocity: decr   General Gait Details: slow steady pace, reliant on RW and external assist for safety at  this time   Stairs             Wheelchair Mobility    Modified Rankin (Stroke Patients Only)       Balance Overall balance assessment: Needs assistance Sitting-balance support: Feet supported Sitting balance-Leahy Scale: Good Sitting balance - Comments: Supervision only for sitting edge of bed   Standing balance support: Bilateral upper extremity supported;During functional activity Standing balance-Leahy Scale: Fair Standing balance comment: reliant on B UE support with standing and supervision/min guard for safety                            Cognition Arousal/Alertness: Awake/alert Behavior During Therapy: WFL for tasks assessed/performed Overall Cognitive Status: Within Functional Limits for tasks assessed                                 General Comments: Pt pleasant, conversational t/o evaluation.      Exercises Total Joint Exercises Ankle Circles/Pumps: AROM;15 reps;Both Quad Sets: AROM;Both;10 reps Heel Slides: AROM;AAROM;10 reps;Seated Hip ABduction/ADduction: AAROM;10 reps;Supine;Right Straight Leg Raises: AAROM;Supine;10 reps;Right Goniometric ROM: 8-70 Other Exercises Other Exercises: Pt educated in falls prevention strategies, safe use of AE/DME for LB ADL management, compression stocking management, polar care management, complementary alternative methods for pain management including gentle self-massage and distraction techniques, pet management and safety strategies, and routines modifications to support safety and fxl independence during meaningful occupations of daily life.    General Comments        Pertinent Vitals/Pain Pain Assessment: Faces Pain Score:  5  Faces Pain Scale: Hurts a little bit Pain Location: R knee with flexion and WB Pain Descriptors / Indicators: Sore;Discomfort;Grimacing Pain Intervention(s): Monitored during session;Premedicated before session;Repositioned    Home Living Family/patient expects  to be discharged to:: Private residence Living Arrangements: Spouse/significant other Available Help at Discharge: Family;Available 24 hours/day Type of Home: Apartment Home Access: Level entry   Home Layout: One level Home Equipment: Toilet riser;Cane - single point Additional Comments: Toilet riser with handles    Prior Function Level of Independence: Independent with assistive device(s)      Comments: was using SPC just prior to admission   PT Goals (current goals can now be found in the care plan section) Acute Rehab PT Goals Patient Stated Goal: to return home PT Goal Formulation: With patient Time For Goal Achievement: 02/12/20 Potential to Achieve Goals: Good Progress towards PT goals: Progressing toward goals    Frequency    BID      PT Plan Current plan remains appropriate    Co-evaluation              AM-PAC PT "6 Clicks" Mobility   Outcome Measure  Help needed turning from your back to your side while in a flat bed without using bedrails?: A Little Help needed moving from lying on your back to sitting on the side of a flat bed without using bedrails?: A Little Help needed moving to and from a bed to a chair (including a wheelchair)?: A Little Help needed standing up from a chair using your arms (e.g., wheelchair or bedside chair)?: A Little Help needed to walk in hospital room?: A Little Help needed climbing 3-5 steps with a railing? : A Lot 6 Click Score: 17    End of Session Equipment Utilized During Treatment: Gait belt Activity Tolerance: Patient tolerated treatment well;Patient limited by pain Patient left: in chair;with chair alarm set;with call bell/phone within reach Nurse Communication: Mobility status PT Visit Diagnosis: Muscle weakness (generalized) (M62.81);Difficulty in walking, not elsewhere classified (R26.2);Pain Pain - Right/Left: Right Pain - part of body: Knee     Time: 4193-7902 PT Time Calculation (min) (ACUTE ONLY): 25  min  Charges:  $Gait Training: 8-22 mins $Therapeutic Exercise: 8-22 mins                     Davena Julian, PT, GCS 02/06/20,10:23 AM

## 2020-02-06 NOTE — Evaluation (Signed)
Occupational Therapy Evaluation Patient Details Name: Sharon Cervantes MRN: [id number] DOB: 06-14-1951 Today's Date: 02/06/2020    History of Present Illness Patient is a 69 year old female admitted for R TKA. PMH includes: HLD, HTN   Clinical Impression   Ms. [name] was seen for OT evaluation this date, POD#1 from above surgery. Pt was independent in all ADLs prior to surgery, however occasionally using a SPC for longer distances due to R knee pain. Pt is eager to return to PLOF with less pain and improved safety and independence. Pt currently requires moderate assist for LB dressing while in seated position due to pain and limited AROM of R knee (See ADL section below for additional detail regarding occupational performance). Pt instructed in polar care mgt, falls prevention strategies, home/routines modifications, DME/AE for LB bathing and dressing tasks, and compression stocking mgt. Pt would benefit from skilled OT services including additional instruction in dressing techniques with or without assistive devices for dressing and bathing skills to support recall and carryover prior to discharge and ultimately to maximize safety, independence, and minimize falls risk and caregiver burden. Upon hospital DC, recommend HHOT to maximize pt safety and return to functional independence during meaningful occupations of daily life.        Follow Up Recommendations  Home health OT;Supervision - Intermittent    Equipment Recommendations  3 in 1 bedside commode    Recommendations for Other Services       Precautions / Restrictions Precautions Precautions: Fall Restrictions Weight Bearing Restrictions: Yes RLE Weight Bearing: Weight bearing as tolerated      Mobility Bed Mobility Overal bed mobility: Needs Assistance             General bed mobility comments: Deferred for pt safety. Pt unable to perform SLR. Physical therapist and RN notified.  Transfers                       Balance Overall balance assessment: Needs assistance Sitting-balance support: Feet unsupported Sitting balance-Leahy Scale: Good Sitting balance - Comments: Pt able to come into long-sitting in bed w/o back support this date. Good static seated balance.                                   ADL either performed or assessed with clinical judgement   ADL Overall ADL's : Needs assistance/impaired                                       General ADL Comments: Pt is functionally limited by increased pain and decreased AROM of her R Knee. Signficantly pain limited at time of OT evaluation and unable to perform straight leg raise off bed during bed mobility. further functional mobility deferred for pt safety. Anticipate pt will require MOD A for LB ADL management and fxl mobility.     Vision Baseline Vision/History: Wears glasses Patient Visual Report: No change from baseline       Perception     Praxis      Pertinent Vitals/Pain Pain Score: 5  Pain Location: R knee at rest Pain Descriptors / Indicators: Sore;Discomfort;Guarding;Grimacing Pain Intervention(s): Limited activity within patient's tolerance;Monitored during session;Repositioned;RN gave pain meds during session;Utilized relaxation techniques;Ice applied     Hand Dominance     Extremity/Trunk Assessment Upper Extremity Assessment Upper Extremity Assessment:  [facility] for tasks assessed   Lower Extremity Assessment Lower Extremity Assessment: RLE deficits/detail RLE Deficits / Details: s/p R TKA, very pain limited during OT evaluation. Pt unable to raise leg off bed. PT/RN notified. RLE: Unable to fully assess due to pain RLE Sensation: WNL   Cervical / Trunk Assessment Cervical / Trunk Assessment: Normal   Communication Communication Communication: No difficulties   Cognition Arousal/Alertness: Awake/alert Behavior During Therapy: WFL for tasks assessed/performed Overall Cognitive  Status: Within Functional Limits for tasks assessed                                 General Comments: Pt pleasant, conversational t/o evaluation.   General Comments       Exercises Other Exercises Other Exercises: Pt educated in falls prevention strategies, safe use of AE/DME for LB ADL management, compression stocking management, polar care management, complementary alternative methods for pain management including gentle self-massage and distraction techniques, pet management and safety strategies, and routines modifications to support safety and fxl independence during meaningful occupations of daily life.   Shoulder Instructions      Home Living Family/patient expects to be discharged to:: Private residence Living Arrangements: Spouse/significant other Available Help at Discharge: Family;Available 24 hours/day Type of Home: Apartment Home Access: Level entry     Home Layout: One level     Bathroom Shower/Tub: Chief Strategy Officer: Standard(W/ riser) Bathroom Accessibility: Yes How Accessible: Accessible via walker Home Equipment: Toilet riser;Cane - single point   Additional Comments: Toilet riser with handles      Prior Functioning/Environment Level of Independence: Independent with assistive device(s)        Comments: was using SPC just prior to admission        OT Problem List: Decreased strength;Decreased coordination;Decreased safety awareness;Impaired balance (sitting and/or standing);Decreased knowledge of precautions;Decreased knowledge of use of DME or AE;Pain      OT Treatment/Interventions: Self-care/ADL training;Therapeutic exercise;Therapeutic activities;DME and/or AE instruction;Patient/family education;Balance training    OT Goals(Current goals can be found in the care plan section) Acute Rehab OT Goals Patient Stated Goal: to return home OT Goal Formulation: With patient Time For Goal Achievement: 02/20/20 Potential  to Achieve Goals: Good ADL Goals Pt Will Perform Lower Body Dressing: with modified independence;with adaptive equipment;sit to/from stand(C LRAD PRN for improved safety and functional independence.) Pt Will Transfer to Toilet: ambulating;with modified independence;bedside commode(C LRAD PRN for improved safety and functional independence.) Pt Will Perform Toileting - Clothing Manipulation and hygiene: sit to/from stand;with modified independence;with adaptive equipment(C LRAD PRN for improved safety and functional independence.)  OT Frequency: Min 2X/week   Barriers to D/C:            Co-evaluation              AM-PAC OT "6 Clicks" Daily Activity     Outcome Measure Help from another person eating meals?: None Help from another person taking care of personal grooming?: A Little Help from another person toileting, which includes using toliet, bedpan, or urinal?: A Lot Help from another person bathing (including washing, rinsing, drying)?: A Little Help from another person to put on and taking off regular upper body clothing?: None Help from another person to put on and taking off regular lower body clothing?: A Lot 6 Click Score: 18   End of Session    Activity Tolerance: Patient limited by pain;Patient tolerated treatment well Patient left: in  bed;with call bell/phone within reach;with bed alarm set;with SCD's reapplied;Other (comment)(Heels floated, polar care in place)  OT Visit Diagnosis: Other abnormalities of gait and mobility (R26.89);Pain Pain - Right/Left: Right Pain - part of body: Knee                Time: 3967-2897 OT Time Calculation (min): 39 min Charges:  OT General Charges $OT Visit: 1 Visit OT Evaluation $OT Eval Moderate Complexity: 1 Mod OT Treatments $Self Care/Home Management : 23-37 mins  Shara Blazing, M.S., OTR/L Ascom: [phone] 02/06/20, 9:47 AM

## 2020-02-06 NOTE — Progress Notes (Signed)
Physical Therapy Treatment Patient Details Name: Sharon Cervantes MRN: [id number] DOB: 10-20-1950 Today's Date: 02/06/2020    History of Present Illness Patient is a 69 year old female admitted for R TKA. PMH includes: HLD, HTN    PT Comments    Patient received in recliner, received pain medicine prior to my arrival. Ready to work with PT. She is able to stand from recliner with min guard. Ambulated 75 feet with RW and min guard. Increased right knee pain with increased distance. She will continue to benefit from skilled PT while here to improve strength, rom and functional independence.    Follow Up Recommendations  Home health PT;Supervision - Intermittent;Supervision for mobility/OOB     Equipment Recommendations  Rolling walker with 5" wheels    Recommendations for Other Services       Precautions / Restrictions Precautions Precautions: Fall Restrictions Weight Bearing Restrictions: No RLE Weight Bearing: Weight bearing as tolerated    Mobility  Bed Mobility Overal bed mobility: Needs Assistance Bed Mobility: Supine to Sit;Sit to Supine     Supine to sit: Modified independent (Device/Increase time) Sit to supine: Min assist   General bed mobility comments: Assist only to bring R LE back up onto bed  Transfers Overall transfer level: Needs assistance Equipment used: Rolling walker (2 wheeled) Transfers: Sit to/from Stand Sit to Stand: Min guard         General transfer comment: sit to stand from recliner with min guard  Ambulation/Gait Ambulation/Gait assistance: Min guard Gait Distance (Feet): 75 Feet Assistive device: Rolling walker (2 wheeled) Gait Pattern/deviations: Step-to pattern;Decreased step length - right;Decreased step length - left Gait velocity: decr   General Gait Details: slow steady pace, reliant on RW and external assist for safety at this time   Stairs             Wheelchair Mobility    Modified Rankin (Stroke Patients Only)        Balance Overall balance assessment: Needs assistance Sitting-balance support: Feet supported Sitting balance-Leahy Scale: Good Sitting balance - Comments: Supervision only for sitting edge of bed   Standing balance support: Bilateral upper extremity supported;During functional activity Standing balance-Leahy Scale: Good Standing balance comment: reliant on B UE support with standing and supervision/min guard for safety                            Cognition Arousal/Alertness: Awake/alert Behavior During Therapy: WFL for tasks assessed/performed Overall Cognitive Status: Within Functional Limits for tasks assessed                                 General Comments: Pt pleasant, conversational      Exercises      General Comments        Pertinent Vitals/Pain Pain Assessment: 0-10 Pain Score: 6  Pain Location: R knee with flexion and WB Pain Descriptors / Indicators: Sore;Discomfort;Grimacing Pain Intervention(s): Monitored during session;Premedicated before session    Home Living                      Prior Function            PT Goals (current goals can now be found in the care plan section) Acute Rehab PT Goals Patient Stated Goal: to return home PT Goal Formulation: With patient Time For Goal Achievement: 02/12/20 Potential to Achieve Goals: Good Progress  towards PT goals: Progressing toward goals    Frequency    BID      PT Plan Current plan remains appropriate    Co-evaluation              AM-PAC PT "6 Clicks" Mobility   Outcome Measure  Help needed turning from your back to your side while in a flat bed without using bedrails?: A Little Help needed moving from lying on your back to sitting on the side of a flat bed without using bedrails?: A Little Help needed moving to and from a bed to a chair (including a wheelchair)?: A Little Help needed standing up from a chair using your arms (e.g., wheelchair or  bedside chair)?: A Little Help needed to walk in hospital room?: A Little Help needed climbing 3-5 steps with a railing? : A Lot 6 Click Score: 17    End of Session Equipment Utilized During Treatment: Gait belt Activity Tolerance: Patient tolerated treatment well;Patient limited by pain Patient left: in bed;with bed alarm set;with family/visitor present;with call bell/phone within reach Nurse Communication: Mobility status PT Visit Diagnosis: Muscle weakness (generalized) (M62.81);Difficulty in walking, not elsewhere classified (R26.2);Pain Pain - Right/Left: Right Pain - part of body: Knee     Time: 0932-3557 PT Time Calculation (min) (ACUTE ONLY): 32 min  Charges:  $Gait Training: 8-22 mins $Therapeutic Exercise: 8-22 mins                     Keilynn Marano, PT, GCS 02/06/20,3:32 PM

## 2020-02-07 MED ORDER — MAGNESIUM HYDROXIDE 400 MG/5ML PO SUSP
30.0000 mL | Freq: Once | ORAL | Status: AC
  Administered 2020-02-07: 30 mL via ORAL
  Filled 2020-02-07: qty 30

## 2020-02-07 MED ORDER — ENOXAPARIN SODIUM 40 MG/0.4ML ~~LOC~~ SOLN: 40.0000 mg | SUBCUTANEOUS | 0 refills | Status: DC

## 2020-02-07 MED ORDER — ACETAMINOPHEN 500 MG PO TABS: 500.0000 mg | ORAL_TABLET | Freq: Four times a day (QID) | ORAL | Status: DC | PRN

## 2020-02-07 MED ORDER — DOCUSATE SODIUM 100 MG PO CAPS: 100.0000 mg | ORAL_CAPSULE | Freq: Two times a day (BID) | ORAL | 0 refills | Status: DC

## 2020-02-07 MED ORDER — METHOCARBAMOL 500 MG PO TABS: 500.0000 mg | ORAL_TABLET | Freq: Four times a day (QID) | ORAL | 0 refills | Status: DC | PRN

## 2020-02-07 MED ORDER — OXYCODONE HCL 5 MG PO TABS: 5.0000 mg | ORAL_TABLET | ORAL | 0 refills | Status: DC | PRN

## 2020-02-07 NOTE — Discharge Instructions (Signed)

## 2020-02-07 NOTE — TOC Progression Note (Signed)
Transition of Care [facility]) - Progression Note    Patient Details  Name: Sharon Cervantes MRN: [id number] Date of Birth: May 06, 1951  Transition of Care [facility]) CM/SW [place], RN Phone Number: 02/07/2020, 10:09 AM  Clinical Narrative:   Met with the patient in the room, Notified her of the Lovenox price of $15, The 3 in 1 and RW was delivered to the room and her wife took the 3 in 1 home Her home health was lined up for [facility], no additional needs    Expected Discharge Plan: [place] Barriers to Discharge: Continued Medical Work up  Expected Discharge Plan and Services Expected Discharge Plan: [place]   Discharge Planning Services: CM Consult   Living arrangements for the past 2 months: Single Family Home                 DME Arranged: 3-N-1, Walker rolling DME Agency: [employer] Date DME Agency Contacted: 02/06/20 Time DME Agency Contacted: 23 Representative spoke with at DME Agency: [place]: PT, OT [place] Agency: [facility] (formerly [employer]) Date [place]: 02/06/20 Time [place]: [place] Representative spoke with at [place]: [place] ([place]) Interventions    Readmission Risk Interventions No flowsheet data found.

## 2020-02-07 NOTE — Progress Notes (Signed)
Physical Therapy Treatment Patient Details Name: Sharon Cervantes MRN: [id number] DOB: 09-30-1950 Today's Date: 02/07/2020    History of Present Illness Patient is a 69 year old female admitted for R TKA. PMH includes: HLD, HTN    PT Comments    Gait 100' in hallway with RW and min guard.  Pt awaiting BM prior to discharge.  Requested time in bathroom after gait.  RN aware.   Follow Up Recommendations  Home health PT;Supervision - Intermittent;Supervision for mobility/OOB     Equipment Recommendations  Rolling walker with 5" wheels    Recommendations for Other Services       Precautions / Restrictions Precautions Precautions: Fall Restrictions Weight Bearing Restrictions: Yes RLE Weight Bearing: Weight bearing as tolerated Other Position/Activity Restrictions: WBAT    Mobility  Bed Mobility               General bed mobility comments: in chair before and after  Transfers Overall transfer level: Needs assistance Equipment used: Rolling walker (2 wheeled) Transfers: Sit to/from Stand Sit to Stand: Min guard;Supervision            Ambulation/Gait Ambulation/Gait assistance: Min guard Gait Distance (Feet): 100 Feet Assistive device: Rolling walker (2 wheeled) Gait Pattern/deviations: Step-to pattern;Decreased step length - right;Decreased step length - left Gait velocity: decr   General Gait Details: slow steady pace, reliant on RW,  no LOB or buckling noted.  good safety   Stairs             Wheelchair Mobility    Modified Rankin (Stroke Patients Only)       Balance Overall balance assessment: Modified Independent                                          Cognition Arousal/Alertness: Awake/alert Behavior During Therapy: WFL for tasks assessed/performed Overall Cognitive Status: Within Functional Limits for tasks assessed                                 General Comments: Pt pleasant, conversational       Exercises Total Joint Exercises Ankle Circles/Pumps: AROM;15 reps;Both Quad Sets: AROM;Both;10 reps Heel Slides: AROM;AAROM;10 reps;Seated Hip ABduction/ADduction: AAROM;10 reps;Supine;Right Straight Leg Raises: AAROM;Supine;10 reps;Right Goniometric ROM: 5-70 - resists flexion.  extensive education provided    General Comments        Pertinent Vitals/Pain Pain Assessment: Faces Pain Score: 5  Faces Pain Scale: Hurts little more Pain Location: R knee with flexion and WB Pain Descriptors / Indicators: Sore;Discomfort;Grimacing Pain Intervention(s): Limited activity within patient's tolerance;Monitored during session;Repositioned    Home Living                      Prior Function            PT Goals (current goals can now be found in the care plan section) Progress towards PT goals: Progressing toward goals    Frequency    BID      PT Plan Current plan remains appropriate    Co-evaluation              AM-PAC PT "6 Clicks" Mobility   Outcome Measure  Help needed turning from your back to your side while in a flat bed without using bedrails?: A Little Help needed moving from lying on your  back to sitting on the side of a flat bed without using bedrails?: A Little Help needed moving to and from a bed to a chair (including a wheelchair)?: A Little Help needed standing up from a chair using your arms (e.g., wheelchair or bedside chair)?: A Little Help needed to walk in hospital room?: A Little Help needed climbing 3-5 steps with a railing? : A Lot 6 Click Score: 17    End of Session Equipment Utilized During Treatment: Gait belt Activity Tolerance: Patient tolerated treatment well Patient left: Other (comment) Nurse Communication: Mobility status;Other (comment) Pain - Right/Left: Right Pain - part of body: Knee     Time: 7195-9747 PT Time Calculation (min) (ACUTE ONLY): 15 min  Charges:  $Gait Training: 8-22 mins $Therapeutic Exercise: 8-22  mins                    Danielle Dess, PTA 02/07/20, 2:18 PM

## 2020-02-07 NOTE — Progress Notes (Signed)
As of shift change patient has not had a bowel movement. Discontinued discharge order until tomorrow

## 2020-02-07 NOTE — Progress Notes (Signed)
Physical Therapy Treatment Patient Details Name: Sharon Cervantes MRN: [id number] DOB: 1951/06/22 Today's Date: 02/07/2020    History of Present Illness Patient is a 69 year old female admitted for R TKA. PMH includes: HLD, HTN    PT Comments    Participated in exercises as described below.  Stood and was able to progress gait 160' with slow but steady gait with no safety concerns.  She declines stair training.  Has no steps in or out of home and does not visit friends who have them.  Encouraged but she continued to decline training.   Extensive education and emphasis on knee flexion. Self stretching techniques taught to pt and spouse in room.  Both voiced understanding and are comfortable with discharge plan.  No further questions or concerns regarding discharge.  Stated she hopes to go home after BM today.   Follow Up Recommendations  Home health PT;Supervision - Intermittent;Supervision for mobility/OOB     Equipment Recommendations       Recommendations for Other Services       Precautions / Restrictions Precautions Precautions: Fall Restrictions Weight Bearing Restrictions: Yes RLE Weight Bearing: Weight bearing as tolerated    Mobility  Bed Mobility               General bed mobility comments: in chair before and after  Transfers Overall transfer level: Needs assistance Equipment used: Rolling walker (2 wheeled) Transfers: Sit to/from Stand Sit to Stand: Min guard            Ambulation/Gait Ambulation/Gait assistance: Min guard Gait Distance (Feet): 160 Feet Assistive device: Rolling walker (2 wheeled) Gait Pattern/deviations: Step-to pattern;Decreased step length - right;Decreased step length - left Gait velocity: decr   General Gait Details: slow steady pace, reliant on RW,  no LOB or buckling noted.  good safety   Stairs             Wheelchair Mobility    Modified Rankin (Stroke Patients Only)       Balance Overall balance assessment:  Modified Independent                                          Cognition Arousal/Alertness: Awake/alert Behavior During Therapy: WFL for tasks assessed/performed Overall Cognitive Status: Within Functional Limits for tasks assessed                                 General Comments: Pt pleasant, conversational      Exercises Total Joint Exercises Ankle Circles/Pumps: AROM;15 reps;Both Quad Sets: AROM;Both;10 reps Heel Slides: AROM;AAROM;10 reps;Seated Hip ABduction/ADduction: AAROM;10 reps;Supine;Right Straight Leg Raises: AAROM;Supine;10 reps;Right Goniometric ROM: 5-70 - resists flexion.  extensive education provided    General Comments        Pertinent Vitals/Pain Pain Assessment: 0-10 Pain Score: 5  Pain Location: R knee with flexion and WB Pain Descriptors / Indicators: Sore;Discomfort;Grimacing Pain Intervention(s): Premedicated before session;Limited activity within patient's tolerance;Ice applied    Home Living                      Prior Function            PT Goals (current goals can now be found in the care plan section) Progress towards PT goals: Progressing toward goals    Frequency    BID  PT Plan Current plan remains appropriate    Co-evaluation              AM-PAC PT "6 Clicks" Mobility   Outcome Measure  Help needed turning from your back to your side while in a flat bed without using bedrails?: A Little Help needed moving from lying on your back to sitting on the side of a flat bed without using bedrails?: A Little Help needed moving to and from a bed to a chair (including a wheelchair)?: A Little Help needed standing up from a chair using your arms (e.g., wheelchair or bedside chair)?: A Little Help needed to walk in hospital room?: A Little Help needed climbing 3-5 steps with a railing? : A Lot 6 Click Score: 17    End of Session Equipment Utilized During Treatment: Gait belt Activity  Tolerance: Patient tolerated treatment well Patient left: in bed;with call bell/phone within reach;with chair alarm set;with family/visitor present Nurse Communication: Mobility status Pain - Right/Left: Right Pain - part of body: Knee     Time: 1012-1100 PT Time Calculation (min) (ACUTE ONLY): 48 min  Charges:  $Gait Training: 23-37 mins $Therapeutic Exercise: 8-22 mins                    Chesley Noon, PTA 02/07/20, 11:27 AM

## 2020-02-07 NOTE — Progress Notes (Signed)
   Subjective: 2 Days Post-Op Procedure(s) (LRB): RIGHT TOTAL KNEE ARTHROPLASTY (Right) APPLICATION OF WOUND VAC (Right) Patient reports pain as mild.   Patient is well, and has had no acute complaints or problems Denies any CP, SOB, ABD pain. We will continue therapy today.  Plan is to go Home after hospital stay.  Objective: Vital signs in last 24 hours: Temp:  [97.6 F (36.4 C)-98.2 F (36.8 C)] 98 F (36.7 C) (06/10 0000) Pulse Rate:  [74-89] 89 (06/10 0000) Resp:  [14-19] 19 (06/10 0000) BP: (132-141)/(84-94) 136/87 (06/10 0000) SpO2:  [95 %-100 %] 98 % (06/10 0000)  Intake/Output from previous day: 06/09 0701 - 06/10 0700 In: 1899.3 [P.O.:480; I.V.:1419.3] Out: 0  Intake/Output this shift: No intake/output data recorded.  Recent Labs    02/05/20 1249  HGB 11.9*   Recent Labs    02/05/20 1249  WBC 11.5*  RBC 4.28  HCT 36.5  PLT 199   Recent Labs    02/05/20 1249  NA 137  K 3.6  CL 102  CO2 27  BUN 17  CREATININE 0.93  GLUCOSE 185*  CALCIUM 8.3*   No results for input(s): LABPT, INR in the last 72 hours.  EXAM General - Patient is Alert, Appropriate and Oriented Extremity - Neurovascular intact Sensation intact distally Intact pulses distally Dorsiflexion/Plantar flexion intact No cellulitis present Compartment soft Dressing - dressing C/D/I and no drainage, prevena intact with out drainage Motor Function - intact, moving foot and toes well on exam.   Past Medical History:  Diagnosis Date  . Arthritis   . Asthma   . Chronic cough   . Colon polyps   . Depression   . GERD (gastroesophageal reflux disease)   . Hiatal hernia    LARGE  . History of chicken pox   . Hypertension   . Macular degeneration   . Multinodular goiter    FNA in past neg h/o thyroid cysts  . Pernicious anemia    PERNICIOUS   . RLS (restless legs syndrome)   . Sleep apnea    NO CPAP    Assessment/Plan:   2 Days Post-Op Procedure(s) (LRB): RIGHT TOTAL KNEE  ARTHROPLASTY (Right) APPLICATION OF WOUND VAC (Right) Active Problems:   Status post total knee replacement using cement, right  Estimated body mass index is 41.27 kg/m as calculated from the following:   Height as of this encounter: 5\' 5"  (1.651 m).   Weight as of this encounter: 112.5 kg. Advance diet Up with therapy  Needs BM VSS CM to assist with discharge to home with home health PT  DVT Prophylaxis - Lovenox, Foot Pumps and TED hose Weight-Bearing as tolerated to right leg   T. , PA-C [facility] 02/07/2020, 7:28 AM

## 2020-02-07 NOTE — Progress Notes (Signed)
Patient given magnesium citrate at 2105. Patient stated she still feels the same and doesn't have to have a bowel movement.

## 2020-02-07 NOTE — Discharge Summary (Addendum)
Physician Discharge Summary  Patient ID: Sharon Cervantes MRN: [id number] DOB/AGE: 02-02-1951 69 y.o.  Admit date: 02/05/2020 Discharge date: 02/08/2020 Admission Diagnoses:  Status post total knee replacement using cement, right [Z96.651]   Discharge Diagnoses: Patient Active Problem List   Diagnosis Date Noted   Status post total knee replacement using cement, right 02/05/2020   Chronic cough 02/02/2019   Mitral valve regurgitation 11/19/2018   Cardiac murmur 11/19/2018   Vitamin D deficiency 10/12/2018   Essential hypertension 10/12/2018   Pernicious anemia 10/12/2018   Elevated alkaline phosphatase level 10/12/2018   Hypercalcemia 10/12/2018   Asthma 10/12/2018   RLS (restless legs syndrome) 10/12/2018   Hyperlipidemia 10/12/2018    Past Medical History:  Diagnosis Date   Arthritis    Asthma    Chronic cough    Colon polyps    Depression    GERD (gastroesophageal reflux disease)    Hiatal hernia    LARGE   History of chicken pox    Hypertension    Macular degeneration    Multinodular goiter    FNA in past neg h/o thyroid cysts   Pernicious anemia    PERNICIOUS    RLS (restless legs syndrome)    Sleep apnea    NO CPAP     Transfusion: none   Consultants (if any):   Discharged Condition: Improved  Hospital Course: Sharon Cervantes is an 69 y.o. female who was admitted 02/05/2020 with a diagnosis of right knee osteoarthritis and went to the operating room on 02/05/2020 and underwent the above named procedures.    Surgeries: Procedure(s): RIGHT TOTAL KNEE ARTHROPLASTY APPLICATION OF WOUND VAC on 02/05/2020 Patient tolerated the surgery well. Taken to PACU where she was stabilized and then transferred to the orthopedic floor.  Started on Lovenox 30 mg q 12 hrs. Foot pumps applied bilaterally at 80 mm. Heels elevated on bed with rolled towels. No evidence of DVT. Negative [place]oman. Physical therapy started on day #1 for gait training and transfer. OT  started day #1 for ADL and assisted devices.  Patient's foley was d/c on day #1. Patient's IV was d/c on day #2.  On post op day #3 patient was stable and ready for discharge to home with HHPT.  Implants: Medacta GMK sphere system with right 30femur, right 4tibia with short stem and 49mm insert. Size 2patella, all components cemented.   She was given perioperative antibiotics:  Anti-infectives (From admission, onward)   Start     Dose/Rate Route Frequency Ordered Stop   02/05/20 1300  ceFAZolin (ANCEF) IVPB 2g/100 mL premix        2 g 200 mL/hr over 30 Minutes Intravenous Every 6 hours 02/05/20 0920 02/05/20 1935   02/05/20 1144  ceFAZolin (ANCEF) 2-4 GM/100ML-% IVPB  Status:  Discontinued       Note to Pharmacy: Jonetta Speak   : cabinet override      02/05/20 1144 02/05/20 1302   02/05/20 0615  ceFAZolin (ANCEF) IVPB 2g/100 mL premix        2 g 200 mL/hr over 30 Minutes Intravenous On call to O.R. 02/05/20 0603 02/05/20 0730   02/05/20 9937  ceFAZolin (ANCEF) 2-4 GM/100ML-% IVPB       Note to Pharmacy: Manfred Arch   : cabinet override      02/05/20 1696 02/05/20 0744    .  She was given sequential compression devices, early ambulation, and Lovenox TEDs for DVT prophylaxis.  She benefited maximally from the hospital stay and there were  no complications.    Recent vital signs:  Vitals:   02/07/20 1555 02/07/20 2323  BP: (!) 142/73 129/75  Pulse: 85 95  Resp: 18 19  Temp: 97.8 F (36.6 C) 98.3 F (36.8 C)  SpO2: 99% 97%    Recent laboratory studies:  Lab Results  Component Value Date   HGB 11.9 (L) 02/05/2020   HGB 12.4 02/01/2020   Lab Results  Component Value Date   WBC 11.5 (H) 02/05/2020   PLT 199 02/05/2020   No results found for: INR Lab Results  Component Value Date   NA 137 02/05/2020   K 3.6 02/05/2020   CL 102 02/05/2020   CO2 27 02/05/2020   BUN 17 02/05/2020   CREATININE 0.93 02/05/2020   GLUCOSE 185 (H) 02/05/2020     Discharge Medications:   Allergies as of 02/08/2020   No Known Allergies     Medication List    STOP taking these medications   cephALEXin 500 MG capsule Commonly known as: KEFLEX     TAKE these medications   acetaminophen 500 MG tablet Commonly known as: TYLENOL Take 1-2 tablets (500-1,000 mg total) by mouth every 6 (six) hours as needed for mild pain (pain score 1-3 or temp > 100.5).   albuterol 108 (90 Base) MCG/ACT inhaler Commonly known as: VENTOLIN HFA Inhale 1-2 puffs into the lungs every 6 (six) hours as needed for wheezing or shortness of breath.   budesonide 0.5 MG/2ML nebulizer solution Commonly known as: PULMICORT Take 0.5 mg by nebulization every 6 (six) hours as needed (shortness of breath or wheezing).   cyanocobalamin 1000 MCG/ML injection Commonly known as: (VITAMIN B-12) Inject 1,000 mcg into the muscle every 30 (thirty) days.   docusate sodium 100 MG capsule Commonly known as: COLACE Take 1 capsule (100 mg total) by mouth 2 (two) times daily.   Dupixent 300 MG/2ML Sopn Generic drug: Dupilumab Inject 300 mg into the skin every 14 (fourteen) days. JUST HAD DOSE TODAY 6-3   enoxaparin 40 MG/0.4ML injection Commonly known as: Lovenox Inject 0.4 mLs (40 mg total) into the skin daily for 14 days.   fluticasone 50 MCG/ACT nasal spray Commonly known as: FLONASE Place into the nose.   hydrochlorothiazide 12.5 MG tablet Commonly known as: HYDRODIURIL Take 12.5 mg by mouth daily.   methocarbamol 500 MG tablet Commonly known as: ROBAXIN Take 1 tablet (500 mg total) by mouth every 6 (six) hours as needed for muscle spasms.   montelukast 10 MG tablet Commonly known as: SINGULAIR Take 1 tablet (10 mg total) by mouth at bedtime. appt further refills What changed: additional instructions   omeprazole 40 MG capsule Commonly known as: PRILOSEC Take 40 mg by mouth at bedtime.   oxyCODONE 5 MG immediate release tablet Commonly known as: Oxy  IR/ROXICODONE Take 1-2 tablets (5-10 mg total) by mouth every 4 (four) hours as needed for moderate pain (pain score 4-6).   pramipexole 1.5 MG tablet Commonly known as: MIRAPEX Take 1.5 mg by mouth at bedtime.   venlafaxine XR 75 MG 24 hr capsule Commonly known as: EFFEXOR-XR Take 225 mg by mouth daily with breakfast.   venlafaxine XR 150 MG 24 hr capsule Commonly known as: EFFEXOR-XR Take 1 capsule (150 mg total) by mouth daily with breakfast.            Durable Medical Equipment  (From admission, onward)         Start     Ordered   02/05/20 [phone]  DME Walker rolling  Once       Question Answer Comment  Walker: With 5 Inch Wheels   Patient needs a walker to treat with the following condition Status post total knee replacement using cement, right      02/05/20 0920   02/05/20 0921  DME 3 n 1  Once        02/05/20 0920   02/05/20 0921  DME Bedside commode  Once       Question:  Patient needs a bedside commode to treat with the following condition  Answer:  Status post total knee replacement using cement, right   02/05/20 0920          Diagnostic Studies: DG Knee 1-2 Views Right  Result Date: 02/05/2020 CLINICAL DATA:  Status post total knee replacement EXAM: RIGHT KNEE - 1-2 VIEW COMPARISON:  None. FINDINGS: Frontal and lateral views were obtained. Patient is status post total knee replacement. Femoral and tibial prosthetic components are well seated. No fracture or dislocation. No erosive change. Soft tissue air is an expected postoperative finding. IMPRESSION: Status post total knee replacement with prosthetic components intact. No fracture or dislocation. Acute postoperative changes noted. Electronically Signed   By: Bretta Bang III M.D.   On: 02/05/2020 10:07    Disposition:      Follow-up Information    Evon Slack, PA-C. Go on 02/19/2020.   Specialties: Orthopedic Surgery, Emergency Medicine Why: at 2:15 pm Contact information: [address]  [place] [address] [phone]                Signed: Patience Musca 02/08/2020, 7:31 AM

## 2020-02-08 NOTE — Progress Notes (Signed)
Occupational Therapy Treatment Patient Details Name: Sharon Cervantes MRN: [id number] DOB: 1951-04-09 Today's Date: 02/08/2020    History of present illness Patient is a 69 year old female admitted for R TKA. PMH includes: HLD, HTN   OT comments  Pt seen for OT tx this date. Pt pleasant, denies complaints and eager to return home. Pt instructed in AE/DME for LB ADL tasks, polar care mgt, compression stocking mgt, and pet care considerations to maximize recall and carryover at home. Pt verbalized understanding and appreciative of additional instruction. Pt continues to benefit from skilled OT services.    Follow Up Recommendations  Home health OT;Supervision - Intermittent    Equipment Recommendations  3 in 1 bedside commode    Recommendations for Other Services      Precautions / Restrictions Precautions Precautions: Fall Restrictions Weight Bearing Restrictions: Yes RLE Weight Bearing: Weight bearing as tolerated       Mobility Bed Mobility               General bed mobility comments: pt just returned to bed from using BSC. requested not to get OOB again at this time. Pt reports feeling confident about DC to home this date. Therapist issued HEP handout and discussed all DC barriers. PT is safe from PT standpoint to DC home this morning.  Transfers                      Balance                                           ADL either performed or assessed with clinical judgement   ADL Overall ADL's : Needs assistance/impaired                                       General ADL Comments: Requires Min-Mod A for LB ADL tasks, pt reports family able to provide needed level of assist     Vision Baseline Vision/History: Wears glasses Patient Visual Report: No change from baseline     Perception     Praxis      Cognition Arousal/Alertness: Awake/alert Behavior During Therapy: WFL for tasks assessed/performed Overall Cognitive  Status: Within Functional Limits for tasks assessed                                 General Comments: Pt pleasant, conversational        Exercises Exercises: Total Joint Total Joint Exercises Ankle Circles/Pumps: AROM;15 reps;Both Quad Sets: AROM;Both;10 reps Short Arc Quad: AROM;10 reps Heel Slides: AROM;10 reps Hip ABduction/ADduction: AROM;10 reps Straight Leg Raises: AAROM;10 reps Other Exercises Other Exercises: Pt instructed in AE/DME for LB ADL tasks, polar care mgt, compression stocking mgt, and pet care considerations to maximize recall and carryover at home. Pt verbalized understanding and appreciative of additional instruction   Shoulder Instructions       General Comments      Pertinent Vitals/ Pain       Pain Assessment: 0-10 Pain Score: 2  Faces Pain Scale: Hurts a little bit Pain Location: R knee with flexion and WB Pain Descriptors / Indicators: Sore;Discomfort;Grimacing Pain Intervention(s): Limited activity within patient's tolerance;Monitored during session;Premedicated before session;Ice applied  Home Living  Prior Functioning/Environment              Frequency  Min 2X/week        Progress Toward Goals  OT Goals(current goals can now be found in the care plan section)  Progress towards OT goals: Progressing toward goals  Acute Rehab OT Goals Patient Stated Goal: to return home OT Goal Formulation: With patient Time For Goal Achievement: 02/20/20 Potential to Achieve Goals: Good  Plan Discharge plan remains appropriate;Frequency remains appropriate    Co-evaluation                 AM-PAC OT "6 Clicks" Daily Activity     Outcome Measure   Help from another person eating meals?: None Help from another person taking care of personal grooming?: A Little Help from another person toileting, which includes using toliet, bedpan, or urinal?: A Little Help from  another person bathing (including washing, rinsing, drying)?: A Little Help from another person to put on and taking off regular upper body clothing?: None Help from another person to put on and taking off regular lower body clothing?: A Lot 6 Click Score: 19    End of Session    OT Visit Diagnosis: Other abnormalities of gait and mobility (R26.89);Pain Pain - Right/Left: Right Pain - part of body: Knee   Activity Tolerance Patient tolerated treatment well   Patient Left in bed;with call bell/phone within reach;with bed alarm set;with SCD's reapplied;Other (comment) (polar care in place)   Nurse Communication          Time: [id number] OT Time Calculation (min): 9 min  Charges: OT General Charges $OT Visit: 1 Visit OT Treatments $Self Care/Home Management : 8-22 mins  Richrd Prime, MPH, MS, OTR/L ascom [phone] 02/08/20, 10:27 AM

## 2020-02-08 NOTE — Progress Notes (Signed)
MD Poggi was notified that the magnesium citrate was ineffective.

## 2020-02-08 NOTE — Plan of Care (Signed)
Problem: Education: Goal: Knowledge of the prescribed therapeutic regimen will improve 02/08/2020 1116 by Meyer Cory, RN Outcome: Adequate for Discharge 02/08/2020 1115 by Meyer Cory, RN Outcome: Progressing   Problem: Activity: Goal: Ability to avoid complications of mobility impairment will improve 02/08/2020 1116 by Meyer Cory, RN Outcome: Adequate for Discharge 02/08/2020 1115 by Meyer Cory, RN Outcome: Progressing Goal: Range of joint motion will improve 02/08/2020 1116 by Meyer Cory, RN Outcome: Adequate for Discharge 02/08/2020 1115 by Meyer Cory, RN Outcome: Progressing   Problem: Clinical Measurements: Goal: Postoperative complications will be avoided or minimized 02/08/2020 1116 by Meyer Cory, RN Outcome: Adequate for Discharge 02/08/2020 1115 by Meyer Cory, RN Outcome: Progressing   Problem: Pain Management: Goal: Pain level will decrease with appropriate interventions 02/08/2020 1116 by Meyer Cory, RN Outcome: Adequate for Discharge 02/08/2020 1115 by Meyer Cory, RN Outcome: Progressing   Problem: Skin Integrity: Goal: Will show signs of wound healing 02/08/2020 1116 by Meyer Cory, RN Outcome: Adequate for Discharge 02/08/2020 1115 by Meyer Cory, RN Outcome: Progressing   Problem: Education: Goal: Knowledge of General Education information will improve Description: Including pain rating scale, medication(s)/side effects and non-pharmacologic comfort measures 02/08/2020 1116 by Meyer Cory, RN Outcome: Adequate for Discharge 02/08/2020 1115 by Meyer Cory, RN Outcome: Progressing   Problem: Health Behavior/Discharge Planning: Goal: Ability to manage health-related needs will improve 02/08/2020 1116 by Meyer Cory, RN Outcome: Adequate for Discharge 02/08/2020 1115 by Meyer Cory, RN Outcome: Progressing   Problem: Clinical Measurements: Goal: Ability to maintain clinical measurements within normal limits  will improve 02/08/2020 1116 by Meyer Cory, RN Outcome: Adequate for Discharge 02/08/2020 1115 by Meyer Cory, RN Outcome: Progressing Goal: Will remain free from infection 02/08/2020 1116 by Meyer Cory, RN Outcome: Adequate for Discharge 02/08/2020 1115 by Meyer Cory, RN Outcome: Progressing Goal: Diagnostic test results will improve 02/08/2020 1116 by Meyer Cory, RN Outcome: Adequate for Discharge 02/08/2020 1115 by Meyer Cory, RN Outcome: Progressing Goal: Respiratory complications will improve 02/08/2020 1116 by Meyer Cory, RN Outcome: Adequate for Discharge 02/08/2020 1115 by Meyer Cory, RN Outcome: Progressing Goal: Cardiovascular complication will be avoided 02/08/2020 1116 by Meyer Cory, RN Outcome: Adequate for Discharge 02/08/2020 1115 by Meyer Cory, RN Outcome: Progressing   Problem: Activity: Goal: Risk for activity intolerance will decrease 02/08/2020 1116 by Meyer Cory, RN Outcome: Adequate for Discharge 02/08/2020 1115 by Meyer Cory, RN Outcome: Progressing   Problem: Nutrition: Goal: Adequate nutrition will be maintained 02/08/2020 1116 by Meyer Cory, RN Outcome: Adequate for Discharge 02/08/2020 1115 by Meyer Cory, RN Outcome: Progressing   Problem: Coping: Goal: Level of anxiety will decrease 02/08/2020 1116 by Meyer Cory, RN Outcome: Adequate for Discharge 02/08/2020 1115 by Meyer Cory, RN Outcome: Progressing   Problem: Elimination: Goal: Will not experience complications related to bowel motility 02/08/2020 1116 by Meyer Cory, RN Outcome: Adequate for Discharge 02/08/2020 1115 by Meyer Cory, RN Outcome: Progressing Goal: Will not experience complications related to urinary retention 02/08/2020 1116 by Meyer Cory, RN Outcome: Adequate for Discharge 02/08/2020 1115 by Meyer Cory, RN Outcome: Progressing   Problem: Pain Managment: Goal: General experience of comfort will  improve 02/08/2020 1116 by Meyer Cory, RN Outcome: Adequate for Discharge 02/08/2020 1115 by Meyer Cory, RN Outcome: Progressing   Problem: Safety: Goal: Ability to  remain free from injury will improve 02/08/2020 1116 by Ferrel Logan, RN Outcome: Adequate for Discharge 02/08/2020 1115 by Ferrel Logan, RN Outcome: Progressing   Problem: Skin Integrity: Goal: Risk for impaired skin integrity will decrease 02/08/2020 1116 by Ferrel Logan, RN Outcome: Adequate for Discharge 02/08/2020 1115 by Ferrel Logan, RN Outcome: Progressing

## 2020-02-08 NOTE — Progress Notes (Addendum)
   Subjective: 3 Days Post-Op Procedure(s) (LRB): RIGHT TOTAL KNEE ARTHROPLASTY (Right) APPLICATION OF WOUND VAC (Right) Patient reports pain as mild.   Patient is well, and has had no acute complaints or problems Denies any CP, SOB, ABD pain. Passing gas, no N/V. Tolerating PO well We will continue therapy today.  Plan is to go Home after hospital stay.  Objective: Vital signs in last 24 hours: Temp:  [97.8 F (36.6 C)-98.4 F (36.9 C)] 98.3 F (36.8 C) (06/10 2323) Pulse Rate:  [85-95] 95 (06/10 2323) Resp:  [18-19] 19 (06/10 2323) BP: (121-142)/(73-81) 129/75 (06/10 2323) SpO2:  [97 %-99 %] 97 % (06/10 2323)  Intake/Output from previous day: 06/10 0701 - 06/11 0700 In: 600 [P.O.:600] Out: 0  Intake/Output this shift: No intake/output data recorded.  Recent Labs    02/05/20 1249  HGB 11.9*   Recent Labs    02/05/20 1249  WBC 11.5*  RBC 4.28  HCT 36.5  PLT 199   Recent Labs    02/05/20 1249  NA 137  K 3.6  CL 102  CO2 27  BUN 17  CREATININE 0.93  GLUCOSE 185*  CALCIUM 8.3*   No results for input(s): LABPT, INR in the last 72 hours.  EXAM General - Patient is Alert, Appropriate and Oriented Extremity - Neurovascular intact Sensation intact distally Intact pulses distally Dorsiflexion/Plantar flexion intact No cellulitis present Compartment soft Dressing - dressing C/D/I and no drainage, prevena intact with out drainage Motor Function - intact, moving foot and toes well on exam.   Past Medical History:  Diagnosis Date  . Arthritis   . Asthma   . Chronic cough   . Colon polyps   . Depression   . GERD (gastroesophageal reflux disease)   . Hiatal hernia    LARGE  . History of chicken pox   . Hypertension   . Macular degeneration   . Multinodular goiter    FNA in past neg h/o thyroid cysts  . Pernicious anemia    PERNICIOUS   . RLS (restless legs syndrome)   . Sleep apnea    NO CPAP    Assessment/Plan:   3 Days Post-Op Procedure(s)  (LRB): RIGHT TOTAL KNEE ARTHROPLASTY (Right) APPLICATION OF WOUND VAC (Right) Active Problems:   Status post total knee replacement using cement, right  Estimated body mass index is 41.27 kg/m as calculated from the following:   Height as of this encounter: 5\' 5"  (1.651 m).   Weight as of this encounter: 112.5 kg. Advance diet Up with therapy  Needs BM VSS CM to assist with discharge to home with home health PT today   DVT Prophylaxis - Lovenox, Foot Pumps and TED hose Weight-Bearing as tolerated to right leg   T. , PA-C [facility] 02/08/2020, 7:31 AM

## 2020-02-08 NOTE — Progress Notes (Signed)
Physical Therapy Treatment Patient Details Name: Sharon Cervantes MRN: [id number] DOB: 05-06-1951 Today's Date: 02/08/2020    History of Present Illness Patient is a 69 year old female admitted for R TKA. PMH includes: HLD, HTN    PT Comments    Pt was supine in bed upon arriving. She just returned to bed from getting up to [facility]. D/C orders in. Therapist discussed DC and all pt's concerns. Issued HEP handout and pt performed without difficulty. See exercises listed below. Discussed car transfers and importance to continue ROM and strengthening until HHPT starts. Pt will benefit from HHPT to address strength, ROM, and safe functional mobility deficits. Pt was in bed with polar care applied, call bell in reach, and towel roll under heel to promote extension. Cleared from PT standpoint for safe DC to home.     Follow Up Recommendations  Home health PT     Equipment Recommendations  None recommended by PT    Recommendations for Other Services       Precautions / Restrictions Precautions Precautions: Fall Restrictions Weight Bearing Restrictions: Yes RLE Weight Bearing: Weight bearing as tolerated    Mobility  Bed Mobility               General bed mobility comments: pt just returned to bed from using BSC. requested not to get OOB again at this time. Pt reports feeling confident about DC to home this date. Therapist issued HEP handout and discussed all DC barriers. PT is safe from PT standpoint to DC home this morning.  Transfers                    Ambulation/Gait                 Stairs             Wheelchair Mobility    Modified Rankin (Stroke Patients Only)       Balance                                            Cognition Arousal/Alertness: Awake/alert Behavior During Therapy: WFL for tasks assessed/performed Overall Cognitive Status: Within Functional Limits for tasks assessed                                  General Comments: Pt pleasant, conversational      Exercises Total Joint Exercises Ankle Circles/Pumps: AROM;15 reps;Both Quad Sets: AROM;Both;10 reps Short Arc Quad: AROM;10 reps Heel Slides: AROM;10 reps Hip ABduction/ADduction: AROM;10 reps Straight Leg Raises: AAROM;10 reps    General Comments        Pertinent Vitals/Pain Pain Assessment: 0-10 Pain Score: 3  Faces Pain Scale: Hurts a little bit Pain Location: R knee with flexion and WB Pain Descriptors / Indicators: Sore;Discomfort;Grimacing Pain Intervention(s): Limited activity within patient's tolerance;Monitored during session;Repositioned;Premedicated before session;Ice applied    Home Living                      Prior Function            PT Goals (current goals can now be found in the care plan section) Acute Rehab PT Goals Patient Stated Goal: to return home Progress towards PT goals: Progressing toward goals    Frequency    BID  PT Plan Current plan remains appropriate    Co-evaluation              AM-PAC PT "6 Clicks" Mobility   Outcome Measure  Help needed turning from your back to your side while in a flat bed without using bedrails?: A Little Help needed moving from lying on your back to sitting on the side of a flat bed without using bedrails?: A Little Help needed moving to and from a bed to a chair (including a wheelchair)?: A Little Help needed standing up from a chair using your arms (e.g., wheelchair or bedside chair)?: A Little Help needed to walk in hospital room?: A Little Help needed climbing 3-5 steps with a railing? : A Little 6 Click Score: 18    End of Session   Activity Tolerance: Patient tolerated treatment well Patient left: in bed;with call bell/phone within reach;with bed alarm set;with SCD's reapplied Nurse Communication: Mobility status;Other (comment) PT Visit Diagnosis: Muscle weakness (generalized) (M62.81);Difficulty in walking, not elsewhere  classified (R26.2);Pain Pain - Right/Left: Right Pain - part of body: Knee     Time: 9941-2904 PT Time Calculation (min) (ACUTE ONLY): 10 min  Charges:  $Therapeutic Exercise: 8-22 mins                     Jetta Lout PTA 02/08/20, 8:56 AM

## 2020-02-08 NOTE — Care Management Important Message (Signed)
Important Message  Patient Details  Name: Sharon Cervantes MRN: [id number] Date of Birth: [birth year]   Medicare Important Message Given:  N/A - LOS <3 / Initial given by admissions     Olegario Messier A Erick Murin 02/08/2020, 7:46 AM

## 2020-02-08 NOTE — Plan of Care (Signed)
  Problem: Education: Goal: Knowledge of the prescribed therapeutic regimen will improve Outcome: Progressing   Problem: Activity: Goal: Ability to avoid complications of mobility impairment will improve Outcome: Progressing Goal: Range of joint motion will improve Outcome: Progressing   Problem: Clinical Measurements: Goal: Postoperative complications will be avoided or minimized Outcome: Progressing   Problem: Pain Management: Goal: Pain level will decrease with appropriate interventions Outcome: Progressing   Problem: Skin Integrity: Goal: Will show signs of wound healing Outcome: Progressing   Problem: Education: Goal: Knowledge of General Education information will improve Description: Including pain rating scale, medication(s)/side effects and non-pharmacologic comfort measures Outcome: Progressing   Problem: Health Behavior/Discharge Planning: Goal: Ability to manage health-related needs will improve Outcome: Progressing   Problem: Clinical Measurements: Goal: Ability to maintain clinical measurements within normal limits will improve Outcome: Progressing Goal: Will remain free from infection Outcome: Progressing Goal: Diagnostic test results will improve Outcome: Progressing Goal: Respiratory complications will improve Outcome: Progressing Goal: Cardiovascular complication will be avoided Outcome: Progressing   Problem: Activity: Goal: Risk for activity intolerance will decrease Outcome: Progressing   Problem: Nutrition: Goal: Adequate nutrition will be maintained Outcome: Progressing   Problem: Coping: Goal: Level of anxiety will decrease Outcome: Progressing   Problem: Elimination: Goal: Will not experience complications related to bowel motility Outcome: Progressing Goal: Will not experience complications related to urinary retention Outcome: Progressing   Problem: Pain Managment: Goal: General experience of comfort will improve Outcome:  Progressing   Problem: Safety: Goal: Ability to remain free from injury will improve Outcome: Progressing   Problem: Skin Integrity: Goal: Risk for impaired skin integrity will decrease Outcome: Progressing   

## 2020-02-08 NOTE — Progress Notes (Signed)
Patient not wearing bone foam. Refused it.

## 2020-02-29 ENCOUNTER — Telehealth: Payer: Self-pay | Admitting: Pediatrics

## 2020-03-12 DIAGNOSIS — K439 Ventral hernia without obstruction or gangrene: Secondary | ICD-10-CM | POA: Insufficient documentation

## 2020-04-10 LAB — COLOGUARD: COLOGUARD: POSITIVE — AB

## 2020-04-29 ENCOUNTER — Encounter: Payer: Medicare Other | Attending: Pulmonary Disease | Admitting: *Deleted

## 2020-04-29 ENCOUNTER — Other Ambulatory Visit: Payer: Self-pay

## 2020-04-29 VITALS — Ht 65.7 in | Wt 251.1 lb

## 2020-04-29 DIAGNOSIS — J45909 Unspecified asthma, uncomplicated: Secondary | ICD-10-CM | POA: Insufficient documentation

## 2020-04-29 DIAGNOSIS — J4550 Severe persistent asthma, uncomplicated: Secondary | ICD-10-CM

## 2020-04-29 NOTE — Patient Instructions (Signed)
Patient Instructions  Patient Details  Name: Sharon Cervantes MRN: [id number] Date of Birth: 12/05/1950 Referring Provider:  Vida Rigger, MD  Below are your personal goals for exercise, nutrition, and risk factors. Our goal is to help you stay on track towards obtaining and maintaining these goals. We will be discussing your progress on these goals with you throughout the program.  Initial Exercise Prescription:  Initial Exercise Prescription - 04/29/20 0900      Date of Initial Exercise RX and Referring Provider   Date 04/29/20    Referring Provider Georga Hacking MD      Treadmill   MPH 2    Grade 0.5    Minutes 15    METs 2.67      REL-XR   Level 1    Speed 50    Minutes 15    METs 2.6      T5 Nustep   Level 2    SPM 80    Minutes 15    METs 2.6      Prescription Details   Frequency (times per week) 2    Duration Progress to 30 minutes of continuous aerobic without signs/symptoms of physical distress      Intensity   THRR 40-80% of Max Heartrate 106-135    Ratings of Perceived Exertion 11-13    Perceived Dyspnea 0-4      Progression   Progression Continue to progress workloads to maintain intensity without signs/symptoms of physical distress.      Resistance Training   Training Prescription Yes    Weight 3 lb    Reps 10-15           Exercise Goals: Frequency: Be able to perform aerobic exercise two to three times per week in program working toward 2-5 days per week of home exercise.  Intensity: Work with a perceived exertion of 11 (fairly light) - 15 (hard) while following your exercise prescription.  We will make changes to your prescription with you as you progress through the program.   Duration: Be able to do 30 to 45 minutes of continuous aerobic exercise in addition to a 5 minute warm-up and a 5 minute cool-down routine.   Nutrition Goals: Your personal nutrition goals will be established when you do your nutrition analysis with the  dietician.  The following are general nutrition guidelines to follow: Cholesterol < 200mg /day Sodium < 1500mg /day Fiber: Women over 50 yrs - 21 grams per day  Personal Goals:  Personal Goals and Risk Factors at Admission - 04/29/20 0803      Core Components/Risk Factors/Patient Goals on Admission    Weight Management Yes;Obesity;Weight Loss    Intervention Weight Management: Develop a combined nutrition and exercise program designed to reach desired caloric intake, while maintaining appropriate intake of nutrient and fiber, sodium and fats, and appropriate energy expenditure required for the weight goal.;Weight Management/Obesity: Establish reasonable short term and long term weight goals.;Weight Management: Provide education and appropriate resources to help participant work on and attain dietary goals.;Obesity: Provide education and appropriate resources to help participant work on and attain dietary goals.    Admit Weight 251 lb 1.6 oz (113.9 kg)    Goal Weight: Short Term 245 lb (111.1 kg)    Goal Weight: Long Term 240 lb (108.9 kg)    Expected Outcomes Short Term: Continue to assess and modify interventions until short term weight is achieved;Long Term: Adherence to nutrition and physical activity/exercise program aimed toward attainment of established weight goal;Weight Loss: Understanding  of general recommendations for a balanced deficit meal plan, which promotes 1-2 lb weight loss per week and includes a negative energy balance of [phone] kcal/d;Understanding recommendations for meals to include 15-35% energy as protein, 25-35% energy from fat, 35-60% energy from carbohydrates, less than 200mg  of dietary cholesterol, 20-35 gm of total fiber daily;Understanding of distribution of calorie intake throughout the day with the consumption of 4-5 meals/snacks    Improve shortness of breath with ADL's Yes    Intervention Provide education, individualized exercise plan and daily activity  instruction to help decrease symptoms of SOB with activities of daily living.    Expected Outcomes Short Term: Improve cardiorespiratory fitness to achieve a reduction of symptoms when performing ADLs;Long Term: Be able to perform more ADLs without symptoms or delay the onset of symptoms    Hypertension Yes    Intervention Provide education on lifestyle modifcations including regular physical activity/exercise, weight management, moderate sodium restriction and increased consumption of fresh fruit, vegetables, and low fat dairy, alcohol moderation, and smoking cessation.;Monitor prescription use compliance.    Expected Outcomes Long Term: Maintenance of blood pressure at goal levels.;Short Term: Continued assessment and intervention until BP is < 140/56mm HG in hypertensive participants. < 130/11mm HG in hypertensive participants with diabetes, heart failure or chronic kidney disease.    Lipids Yes    Intervention Provide education and support for participant on nutrition & aerobic/resistive exercise along with prescribed medications to achieve LDL 70mg , HDL >40mg .    Expected Outcomes Short Term: Participant states understanding of desired cholesterol values and is compliant with medications prescribed. Participant is following exercise prescription and nutrition guidelines.;Long Term: Cholesterol controlled with medications as prescribed, with individualized exercise RX and with personalized nutrition plan. Value goals: LDL < 70mg , HDL > 40 mg.           Tobacco Use Initial Evaluation: Social History   Tobacco Use  Smoking Status Never Smoker  Smokeless Tobacco Never Used    Exercise Goals and Review:  Exercise Goals    Row Name 04/29/20 0909             Exercise Goals   Increase Physical Activity Yes       Intervention Provide advice, education, support and counseling about physical activity/exercise needs.;Develop an individualized exercise prescription for aerobic and resistive  training based on initial evaluation findings, risk stratification, comorbidities and participant's personal goals.       Expected Outcomes Short Term: Attend rehab on a regular basis to increase amount of physical activity.;Long Term: Add in home exercise to make exercise part of routine and to increase amount of physical activity.;Long Term: Exercising regularly at least 3-5 days a week.       Increase Strength and Stamina Yes       Intervention Provide advice, education, support and counseling about physical activity/exercise needs.;Develop an individualized exercise prescription for aerobic and resistive training based on initial evaluation findings, risk stratification, comorbidities and participant's personal goals.       Expected Outcomes Short Term: Increase workloads from initial exercise prescription for resistance, speed, and METs.;Short Term: Perform resistance training exercises routinely during rehab and add in resistance training at home;Long Term: Improve cardiorespiratory fitness, muscular endurance and strength as measured by increased METs and functional capacity (05/01/20)       Able to understand and use rate of perceived exertion (RPE) scale Yes       Intervention Provide education and explanation on how to use RPE scale  Expected Outcomes Short Term: Able to use RPE daily in rehab to express subjective intensity level;Long Term:  Able to use RPE to guide intensity level when exercising independently       Able to understand and use Dyspnea scale Yes       Intervention Provide education and explanation on how to use Dyspnea scale       Expected Outcomes Short Term: Able to use Dyspnea scale daily in rehab to express subjective sense of shortness of breath during exertion;Long Term: Able to use Dyspnea scale to guide intensity level when exercising independently       Knowledge and understanding of Target Heart Rate Range (THRR) Yes       Intervention Provide education and  explanation of THRR including how the numbers were predicted and where they are located for reference       Expected Outcomes Short Term: Able to state/look up THRR;Long Term: Able to use THRR to govern intensity when exercising independently;Short Term: Able to use daily as guideline for intensity in rehab       Able to check pulse independently Yes       Intervention Provide education and demonstration on how to check pulse in carotid and radial arteries.;Review the importance of being able to check your own pulse for safety during independent exercise       Expected Outcomes Short Term: Able to explain why pulse checking is important during independent exercise;Long Term: Able to check pulse independently and accurately       Understanding of Exercise Prescription Yes       Intervention Provide education, explanation, and written materials on patient's individual exercise prescription       Expected Outcomes Short Term: Able to explain program exercise prescription;Long Term: Able to explain home exercise prescription to exercise independently              Copy of goals given to participant.

## 2020-04-29 NOTE — Progress Notes (Signed)
Pulmonary Individual Treatment Plan  Patient Details  Name: Sharon Cervantes MRN: [id number] Date of Birth: 1951-07-22 Referring Provider:     Pulmonary Rehab from 04/29/2020 in [facility] Cardiac and Pulmonary Rehab  Referring Provider Claudette Stapler MD      Initial Encounter Date:    Pulmonary Rehab from 04/29/2020 in [facility] Cardiac and Pulmonary Rehab  Date 04/29/20      Visit Diagnosis: Severe persistent asthma, unspecified whether complicated  Patient's Home Medications on Admission:  Current Outpatient Medications:  .  acetaminophen (TYLENOL) 500 MG tablet, Take 1-2 tablets (500-1,000 mg total) by mouth every 6 (six) hours as needed for mild pain (pain score 1-3 or temp > 100.5)., Disp: , Rfl:  .  albuterol (PROVENTIL HFA;VENTOLIN HFA) 108 (90 Base) MCG/ACT inhaler, Inhale 1-2 puffs into the lungs every 6 (six) hours as needed for wheezing or shortness of breath. , Disp: , Rfl:  .  budesonide (PULMICORT) 0.5 MG/2ML nebulizer solution, Inhale into the lungs., Disp: , Rfl:  .  cyanocobalamin (,VITAMIN B-12,) 1000 MCG/ML injection, Inject 1,000 mcg into the muscle every 30 (thirty) days., Disp: , Rfl:  .  docusate sodium (COLACE) 100 MG capsule, Take 1 capsule (100 mg total) by mouth 2 (two) times daily., Disp: 10 capsule, Rfl: 0 .  esomeprazole (NEXIUM) 40 MG capsule, Take by mouth., Disp: , Rfl:  .  fluticasone (FLOVENT HFA) 220 MCG/ACT inhaler, Inhale into the lungs., Disp: , Rfl:  .  gabapentin (NEURONTIN) 300 MG capsule, Take by mouth., Disp: , Rfl:  .  hydrochlorothiazide (HYDRODIURIL) 12.5 MG tablet, Take by mouth., Disp: , Rfl:  .  methocarbamol (ROBAXIN) 500 MG tablet, Take 1 tablet (500 mg total) by mouth every 6 (six) hours as needed for muscle spasms., Disp: 210 tablet, Rfl: 0 .  montelukast (SINGULAIR) 10 MG tablet, Take 1 tablet (10 mg total) by mouth at bedtime. appt further refills (Patient taking differently: Take 10 mg by mouth at bedtime. ), Disp: 90 tablet, Rfl: 1 .   montelukast (SINGULAIR) 10 MG tablet, Take by mouth., Disp: , Rfl:  .  pramipexole (MIRAPEX) 1.5 MG tablet, Take 1.5 mg by mouth at bedtime. , Disp: , Rfl:  .  venlafaxine XR (EFFEXOR-XR) 75 MG 24 hr capsule, Take 225 mg by mouth daily with breakfast., Disp: , Rfl:  .  budesonide (PULMICORT) 0.5 MG/2ML nebulizer solution, Take 0.5 mg by nebulization every 6 (six) hours as needed (shortness of breath or wheezing). , Disp: , Rfl:  .  Dupilumab (DUPIXENT) 300 MG/2ML SOPN, Inject 300 mg into the skin every 14 (fourteen) days. JUST HAD DOSE TODAY 6-3 (Patient not taking: Reported on 04/29/2020), Disp: , Rfl:  .  enoxaparin (LOVENOX) 40 MG/0.4ML injection, Inject 0.4 mLs (40 mg total) into the skin daily for 14 days., Disp: 5.6 mL, Rfl: 0 .  fluticasone (FLONASE) 50 MCG/ACT nasal spray, Place into the nose., Disp: , Rfl:  .  hydrochlorothiazide (HYDRODIURIL) 12.5 MG tablet, Take 12.5 mg by mouth daily. , Disp: , Rfl:  .  omeprazole (PRILOSEC) 40 MG capsule, Take 40 mg by mouth at bedtime., Disp: , Rfl:  .  oxyCODONE (OXY IR/ROXICODONE) 5 MG immediate release tablet, Take 1-2 tablets (5-10 mg total) by mouth every 4 (four) hours as needed for moderate pain (pain score 4-6). (Patient not taking: Reported on 04/29/2020), Disp: 40 tablet, Rfl: 0 .  venlafaxine XR (EFFEXOR-XR) 150 MG 24 hr capsule, Take 1 capsule (150 mg total) by mouth daily with breakfast. (Patient not  taking: Reported on 04/29/2020), Disp: 90 capsule, Rfl: 3 No current facility-administered medications for this visit.  Facility-Administered Medications Ordered in Other Visits:  .  [COMPLETED] sodium chloride 0.9 % nebulizer solution 3 mL, 3 mL, Nebulization, Once, 3 mL at 10/03/18 1130 **FOLLOWED BY** [COMPLETED] methacholine (PROVOCHOLINE) inhaler solution 0.125 mg, 2 mL, Inhalation, Once, 0.125 mg at 10/03/18 1139 **FOLLOWED BY** methacholine (PROVOCHOLINE) inhaler solution 0.5 mg, 2 mL, Inhalation, Once **FOLLOWED BY** methacholine  (PROVOCHOLINE) inhaler solution 2 mg, 2 mL, Inhalation, Once **FOLLOWED BY** methacholine (PROVOCHOLINE) inhaler solution 8 mg, 2 mL, Inhalation, Once **FOLLOWED BY** methacholine (PROVOCHOLINE) inhaler solution 32 mg, 2 mL, Inhalation, Once **FOLLOWED BY** [COMPLETED] albuterol (PROVENTIL) (2.5 MG/3ML) 0.083% nebulizer solution 2.5 mg, 2.5 mg, Nebulization, Once, Ottie Glazier, MD, 2.5 mg at 10/03/18 1146  Past Medical History: Past Medical History:  Diagnosis Date  . Arthritis   . Asthma   . Chronic cough   . Colon polyps   . Depression   . GERD (gastroesophageal reflux disease)   . Hiatal hernia    LARGE  . History of chicken pox   . Hypertension   . Macular degeneration   . Multinodular goiter    FNA in past neg h/o thyroid cysts  . Pernicious anemia    PERNICIOUS   . RLS (restless legs syndrome)   . Sleep apnea    NO CPAP    Tobacco Use: Social History   Tobacco Use  Smoking Status Never Smoker  Smokeless Tobacco Never Used    Labs: Recent Review Scientist, physiological    Labs for ITP Cardiac and Pulmonary Rehab Latest Ref Rng & Units 10/12/2018   Hemoglobin A1c 4.6 - 6.5 % 5.6       Pulmonary Assessment Scores:  Pulmonary Assessment Scores    Row Name 04/29/20 0834         ADL UCSD   ADL Phase Entry     SOB Score total 29     Rest 0     Walk 0     Stairs 5     Bath 0     Dress 0     Shop 1       CAT Score   CAT Score 26       mMRC Score   mMRC Score 1            UCSD: Self-administered rating of dyspnea associated with activities of daily living (ADLs) 6-point scale (0 = "not at all" to 5 = "maximal or unable to do because of breathlessness")  Scoring Scores range from 0 to 120.  Minimally important difference is 5 units  CAT: CAT can identify the health impairment of COPD patients and is better correlated with disease progression.  CAT has a scoring range of zero to 40. The CAT score is classified into four groups of low (less than 10), medium  (10 - 20), high (21-30) and very high (31-40) based on the impact level of disease on health status. A CAT score over 10 suggests significant symptoms.  A worsening CAT score could be explained by an exacerbation, poor medication adherence, poor inhaler technique, or progression of COPD or comorbid conditions.  CAT MCID is 2 points  mMRC: mMRC (Modified Medical Research Council) Dyspnea Scale is used to assess the degree of baseline functional disability in patients of respiratory disease due to dyspnea. No minimal important difference is established. A decrease in score of 1 point or greater is considered a positive change.  Pulmonary Function Assessment:  Pulmonary Function Assessment - 04/29/20 0756      Breath   Bilateral Breath Sounds Clear    Shortness of Breath No;Limiting activity           Exercise Target Goals: Exercise Program Goal: Individual exercise prescription set using results from initial 6 min walk test and THRR while considering  patient's activity barriers and safety.   Exercise Prescription Goal: Initial exercise prescription builds to 30-45 minutes a day of aerobic activity, 2-3 days per week.  Home exercise guidelines will be given to patient during program as part of exercise prescription that the participant will acknowledge.  Education: Aerobic Exercise & Resistance Training: - Gives group verbal and written instruction on the various components of exercise. Focuses on aerobic and resistive training programs and the benefits of this training and how to safely progress through these programs..   Education: Exercise & Equipment Safety: - Individual verbal instruction and demonstration of equipment use and safety with use of the equipment.   Pulmonary Rehab from 04/29/2020 in [facility] Cardiac and Pulmonary Rehab  Date 04/29/20  Educator [facility]  Instruction Review Code 1- Verbalizes Understanding      Education: Exercise Physiology & General Exercise  Guidelines: - Group verbal and written instruction with models to review the exercise physiology of the cardiovascular system and associated critical values. Provides general exercise guidelines with specific guidelines to those with heart or lung disease.    Education: Flexibility, Balance, Mind/Body Relaxation: Provides group verbal/written instruction on the benefits of flexibility and balance training, including mind/body exercise modes such as yoga, pilates and tai chi.  Demonstration and skill practice provided.   Activity Barriers & Risk Stratification:  Activity Barriers & Cardiac Risk Stratification - 04/29/20 0757      Activity Barriers & Cardiac Risk Stratification   Activity Barriers Deconditioning;Joint Problems;Other (comment);Muscular Weakness;Arthritis;Shortness of Breath;Right Knee Replacement;Balance Concerns    Comments TKR completed July 2021           6 Minute Walk:  6 Minute Walk    Row Name 04/29/20 0903         6 Minute Walk   Phase Initial     Distance 1320 feet     Walk Time 6 minutes     # of Rest Breaks 0     MPH 2.5     METS 2.34     RPE 13     Perceived Dyspnea  2     VO2 Peak 8.2     Symptoms Yes (comment)     Comments SOB     Resting HR 76 bpm     Resting BP 134/74     Resting Oxygen Saturation  95 %     Exercise Oxygen Saturation  during 6 min walk 89 %     Max Ex. HR 108 bpm     Max Ex. BP 144/66     2 Minute Post BP 136/70       Interval HR   1 Minute HR 100     2 Minute HR 98     3 Minute HR 100     4 Minute HR 106     5 Minute HR 101     6 Minute HR 108     2 Minute Post HR 86     Interval Heart Rate? Yes       Interval Oxygen   Interval Oxygen? Yes     Baseline Oxygen Saturation % 95 %  1 Minute Oxygen Saturation % 91 %     1 Minute Liters of Oxygen 0 L  Room Air     2 Minute Oxygen Saturation % 89 %     2 Minute Liters of Oxygen 0 L     3 Minute Oxygen Saturation % 92 %     3 Minute Liters of Oxygen 0 L     4  Minute Oxygen Saturation % 93 %     4 Minute Liters of Oxygen 0 L     5 Minute Oxygen Saturation % 94 %     5 Minute Liters of Oxygen 0 L     6 Minute Oxygen Saturation % 94 %     6 Minute Liters of Oxygen 0 L     2 Minute Post Oxygen Saturation % 96 %     2 Minute Post Liters of Oxygen 0 L           Oxygen Initial Assessment:  Oxygen Initial Assessment - 04/29/20 0756      Home Oxygen   Home Oxygen Device None    Sleep Oxygen Prescription None    Home Exercise Oxygen Prescription None    Home at Rest Exercise Oxygen Prescription None      Initial 6 min Walk   Oxygen Used None      Program Oxygen Prescription   Program Oxygen Prescription None      Intervention   Short Term Goals To learn and understand importance of monitoring SPO2 with pulse oximeter and demonstrate accurate use of the pulse oximeter.;To learn and understand importance of maintaining oxygen saturations>88%;To learn and demonstrate proper pursed lip breathing techniques or other breathing techniques.;To learn and demonstrate proper use of respiratory medications    Long  Term Goals Verbalizes importance of monitoring SPO2 with pulse oximeter and return demonstration;Maintenance of O2 saturations>88%;Exhibits proper breathing techniques, such as pursed lip breathing or other method taught during program session;Compliance with respiratory medication;Demonstrates proper use of MDI's           Oxygen Re-Evaluation:   Oxygen Discharge (Final Oxygen Re-Evaluation):   Initial Exercise Prescription:  Initial Exercise Prescription - 04/29/20 0900      Date of Initial Exercise RX and Referring Provider   Date 04/29/20    Referring Provider Claudette Stapler MD      Treadmill   MPH 2    Grade 0.5    Minutes 15    METs 2.67      REL-XR   Level 1    Speed 50    Minutes 15    METs 2.6      T5 Nustep   Level 2    SPM 80    Minutes 15    METs 2.6      Prescription Details   Frequency (times per  week) 2    Duration Progress to 30 minutes of continuous aerobic without signs/symptoms of physical distress      Intensity   THRR 40-80% of Max Heartrate 106-135    Ratings of Perceived Exertion 11-13    Perceived Dyspnea 0-4      Progression   Progression Continue to progress workloads to maintain intensity without signs/symptoms of physical distress.      Resistance Training   Training Prescription Yes    Weight 3 lb    Reps 10-15           Perform Capillary Blood Glucose checks as needed.  Exercise Prescription Changes:  Exercise Prescription Changes    Row Name 04/29/20 0900             Response to Exercise   Blood Pressure (Admit) 134/74       Blood Pressure (Exercise) 144/66       Blood Pressure (Exit) 128/60       Heart Rate (Admit) 76 bpm       Heart Rate (Exercise) 108 bpm       Heart Rate (Exit) 86 bpm       Oxygen Saturation (Admit) 95 %       Oxygen Saturation (Exercise) 89 %       Oxygen Saturation (Exit) 94 %       Rating of Perceived Exertion (Exercise) 13       Perceived Dyspnea (Exercise) 2       Symptoms SOB       Comments walk test results              Exercise Comments:   Exercise Goals and Review:  Exercise Goals    Row Name 04/29/20 0909             Exercise Goals   Increase Physical Activity Yes       Intervention Provide advice, education, support and counseling about physical activity/exercise needs.;Develop an individualized exercise prescription for aerobic and resistive training based on initial evaluation findings, risk stratification, comorbidities and participant's personal goals.       Expected Outcomes Short Term: Attend rehab on a regular basis to increase amount of physical activity.;Long Term: Add in home exercise to make exercise part of routine and to increase amount of physical activity.;Long Term: Exercising regularly at least 3-5 days a week.       Increase Strength and Stamina Yes       Intervention Provide  advice, education, support and counseling about physical activity/exercise needs.;Develop an individualized exercise prescription for aerobic and resistive training based on initial evaluation findings, risk stratification, comorbidities and participant's personal goals.       Expected Outcomes Short Term: Increase workloads from initial exercise prescription for resistance, speed, and METs.;Short Term: Perform resistance training exercises routinely during rehab and add in resistance training at home;Long Term: Improve cardiorespiratory fitness, muscular endurance and strength as measured by increased METs and functional capacity (6MWT)       Able to understand and use rate of perceived exertion (RPE) scale Yes       Intervention Provide education and explanation on how to use RPE scale       Expected Outcomes Short Term: Able to use RPE daily in rehab to express subjective intensity level;Long Term:  Able to use RPE to guide intensity level when exercising independently       Able to understand and use Dyspnea scale Yes       Intervention Provide education and explanation on how to use Dyspnea scale       Expected Outcomes Short Term: Able to use Dyspnea scale daily in rehab to express subjective sense of shortness of breath during exertion;Long Term: Able to use Dyspnea scale to guide intensity level when exercising independently       Knowledge and understanding of Target Heart Rate Range (THRR) Yes       Intervention Provide education and explanation of THRR including how the numbers were predicted and where they are located for reference       Expected Outcomes Short Term: Able to state/look up THRR;Long Term: Able to  use THRR to govern intensity when exercising independently;Short Term: Able to use daily as guideline for intensity in rehab       Able to check pulse independently Yes       Intervention Provide education and demonstration on how to check pulse in carotid and radial arteries.;Review  the importance of being able to check your own pulse for safety during independent exercise       Expected Outcomes Short Term: Able to explain why pulse checking is important during independent exercise;Long Term: Able to check pulse independently and accurately       Understanding of Exercise Prescription Yes       Intervention Provide education, explanation, and written materials on patient's individual exercise prescription       Expected Outcomes Short Term: Able to explain program exercise prescription;Long Term: Able to explain home exercise prescription to exercise independently              Exercise Goals Re-Evaluation :   Discharge Exercise Prescription (Final Exercise Prescription Changes):  Exercise Prescription Changes - 04/29/20 0900      Response to Exercise   Blood Pressure (Admit) 134/74    Blood Pressure (Exercise) 144/66    Blood Pressure (Exit) 128/60    Heart Rate (Admit) 76 bpm    Heart Rate (Exercise) 108 bpm    Heart Rate (Exit) 86 bpm    Oxygen Saturation (Admit) 95 %    Oxygen Saturation (Exercise) 89 %    Oxygen Saturation (Exit) 94 %    Rating of Perceived Exertion (Exercise) 13    Perceived Dyspnea (Exercise) 2    Symptoms SOB    Comments walk test results           Nutrition:  Target Goals: Understanding of nutrition guidelines, daily intake of sodium <1546m, cholesterol <2054m calories 30% from fat and 7% or less from saturated fats, daily to have 5 or more servings of fruits and vegetables.  Education: Controlling Sodium/Reading Food Labels -Group verbal and written material supporting the discussion of sodium use in heart healthy nutrition. Review and explanation with models, verbal and written materials for utilization of the food label.   Education: General Nutrition Guidelines/Fats and Fiber: -Group instruction provided by verbal, written material, models and posters to present the general guidelines for heart healthy nutrition. Gives  an explanation and review of dietary fats and fiber.   Biometrics:  Pre Biometrics - 04/29/20 0909      Pre Biometrics   Height 5' 5.7" (1.669 m)    Weight 251 lb 1.6 oz (113.9 kg)    BMI (Calculated) 40.89    Single Leg Stand 6.47 seconds            Nutrition Therapy Plan and Nutrition Goals:   Nutrition Assessments:  Nutrition Assessments - 04/29/20 0851      MEDFICTS Scores   Pre Score 42           MEDIFICTS Score Key:          ?70 Need to make dietary changes          40-70 Heart Healthy Diet         ? 40 Therapeutic Level Cholesterol Diet  Nutrition Goals Re-Evaluation:   Nutrition Goals Discharge (Final Nutrition Goals Re-Evaluation):   Psychosocial: Target Goals: Acknowledge presence or absence of significant depression and/or stress, maximize coping skills, provide positive support system. Participant is able to verbalize types and ability to use techniques and skills needed for  reducing stress and depression.   Education: Depression - Provides group verbal and written instruction on the correlation between heart/lung disease and depressed mood, treatment options, and the stigmas associated with seeking treatment.   Education: Sleep Hygiene -Provides group verbal and written instruction about how sleep can affect your health.  Define sleep hygiene, discuss sleep cycles and impact of sleep habits. Review good sleep hygiene tips.    Education: Stress and Anxiety: - Provides group verbal and written instruction about the health risks of elevated stress and causes of high stress.  Discuss the correlation between heart/lung disease and anxiety and treatment options. Review healthy ways to manage with stress and anxiety.   Initial Review & Psychosocial Screening:  Initial Psych Review & Screening - 04/29/20 0758      Initial Review   Current issues with Current Stress Concerns;History of Depression;Current Depression;Current Sleep Concerns    Source of  Stress Concerns Chronic Illness;Financial    Comments PHQ is 5, no feeling of depression currently, not sleeping great 4-5 hours a night (going to bed late and up early), finances have gotten better      [place]? Yes   Sharla Kidney lives near by, [place] friend April (moving this weekend)   Concerns Inappropriate over/under dependence on family/friends      Barriers   Psychosocial barriers to participate in program The patient should benefit from training in stress management and relaxation.;Psychosocial barriers identified (see note)      Screening Interventions   Interventions Provide feedback about the scores to participant;To provide support and resources with identified psychosocial needs;Encouraged to exercise    Expected Outcomes Short Term goal: Utilizing psychosocial counselor, staff and physician to assist with identification of specific Stressors or current issues interfering with healing process. Setting desired goal for each stressor or current issue identified.;Long Term Goal: Stressors or current issues are controlled or eliminated.;Short Term goal: Identification and review with participant of any Quality of Life or Depression concerns found by scoring the questionnaire.;Long Term goal: The participant improves quality of Life and PHQ9 Scores as seen by post scores and/or verbalization of changes           Quality of Life Scores:  Scores of 19 and below usually indicate a poorer quality of life in these areas.  A difference of  2-3 points is a clinically meaningful difference.  A difference of 2-3 points in the total score of the Quality of Life Index has been associated with significant improvement in overall quality of life, self-image, physical symptoms, and general health in studies assessing change in quality of life.  PHQ-9: Recent Review Flowsheet Data    Depression screen [facility] 2/9 04/29/2020 12/24/2019 12/10/2019 10/29/2019 10/11/2019   Decreased  Interest 0 0 2 0 1   Down, Depressed, Hopeless 0 0 3 0 1   PHQ - 2 Score 0 0 5 0 2   Altered sleeping _0 0 3   Tired, decreased energy _1 0 3   Change in appetite 0 0 0 0 3   Feeling bad or failure about yourself  0 0 1 0 1   Trouble concentrating _2 0 1   Moving slowly or fidgety/restless 0 0 0 0 0   Suicidal thoughts 0 0 0 0 0   PHQ-9 Score _3 0 13   Difficult doing work/chores Somewhat difficult Not difficult at all Somewhat difficult Not difficult at all Somewhat  difficult     Interpretation of Total Score  Total Score Depression Severity:  1-4 = Minimal depression, 5-9 = Mild depression, 10-14 = Moderate depression, 15-19 = Moderately severe depression, 20-27 = Severe depression   Psychosocial Evaluation and Intervention:  Psychosocial Evaluation - 04/29/20 0800      Psychosocial Evaluation & Interventions   Interventions Stress management education;Encouraged to exercise with the program and follow exercise prescription    Comments Hong is coming back to pulmonary rehab with her asthma that has gotten worse since surgery.  She wants to focus on getting on her nebulizer and get back to walking longer.  She has a few new friends, but one is moving away this weekend.  She wants to get her weight back down too. Her finances are doing better.  Sleep continues to struggle for her.    Expected Outcomes Short: Attend rehab to improve breathing and endurance  Long: Continue to focus on positive and use mental boost    Continue Psychosocial Services  Follow up required by staff           Psychosocial Re-Evaluation:   Psychosocial Discharge (Final Psychosocial Re-Evaluation):   Education: Education Goals: Education classes will be provided on a weekly basis, covering required topics. Participant will state understanding/return demonstration of topics presented.  Learning Barriers/Preferences:  Learning Barriers/Preferences - 04/29/20 0803      Learning  Barriers/Preferences   Learning Barriers None    Learning Preferences None           General Pulmonary Education Topics:  Infection Prevention: - Provides verbal and written material to individual with discussion of infection control including proper hand washing and proper equipment cleaning during exercise session.   Pulmonary Rehab from 04/29/2020 in [facility] Cardiac and Pulmonary Rehab  Date 04/29/20  Educator [facility]  Instruction Review Code 1- Verbalizes Understanding      Falls Prevention: - Provides verbal and written material to individual with discussion of falls prevention and safety.   Pulmonary Rehab from 04/29/2020 in [facility] Cardiac and Pulmonary Rehab  Date 04/29/20  Educator [facility]  Instruction Review Code 1- Verbalizes Understanding      Chronic Lung Diseases: - Group verbal and written instruction to review updates, respiratory medications, advancements in procedures and treatments. Discuss use of supplemental oxygen including available portable oxygen systems, continuous and intermittent flow rates, concentrators, personal use and safety guidelines. Review proper use of inhaler and spacers. Provide informative websites for self-education.    Pulmonary Rehab from 04/29/2020 in [facility] Cardiac and Pulmonary Rehab  Date 04/29/20  Instruction Review Code 3- Needs Reinforcement  [need identified]      Energy Conservation: - Provide group verbal and written instruction for methods to conserve energy, plan and organize activities. Instruct on pacing techniques, use of adaptive equipment and posture/positioning to relieve shortness of breath.   Triggers and Exacerbations: - Group verbal and written instruction to review types of environmental triggers and ways to prevent exacerbations. Discuss weather changes, air quality and the benefits of nasal washing. Review warning signs and symptoms to help prevent infections. Discuss techniques for effective airway clearance, coughing, and  vibrations.   AED/CPR: - Group verbal and written instruction with the use of models to demonstrate the basic use of the AED with the basic ABC's of resuscitation.   Anatomy and Physiology of the Lungs: - Group verbal and written instruction with the use of models to provide basic lung anatomy and physiology related to function, structure and complications of lung  disease.   Anatomy & Physiology of the Heart: - Group verbal and written instruction and models provide basic cardiac anatomy and physiology, with the coronary electrical and arterial systems. Review of Valvular disease and Heart Failure   Cardiac Medications: - Group verbal and written instruction to review commonly prescribed medications for heart disease. Reviews the medication, class of the drug, and side effects.   Other: -Provides group and verbal instruction on various topics (see comments)   Knowledge Questionnaire Score:  Knowledge Questionnaire Score - 04/29/20 0851      Knowledge Questionnaire Score   Pre Score 16/18 Education Focus: O2 safety            Core Components/Risk Factors/Patient Goals at Admission:  Personal Goals and Risk Factors at Admission - 04/29/20 0803      Core Components/Risk Factors/Patient Goals on Admission    Weight Management Yes;Obesity;Weight Loss    Intervention Weight Management: Develop a combined nutrition and exercise program designed to reach desired caloric intake, while maintaining appropriate intake of nutrient and fiber, sodium and fats, and appropriate energy expenditure required for the weight goal.;Weight Management/Obesity: Establish reasonable short term and long term weight goals.;Weight Management: Provide education and appropriate resources to help participant work on and attain dietary goals.;Obesity: Provide education and appropriate resources to help participant work on and attain dietary goals.    Admit Weight 251 lb 1.6 oz (113.9 kg)    Goal Weight: Short  Term 245 lb (111.1 kg)    Goal Weight: Long Term 240 lb (108.9 kg)    Expected Outcomes Short Term: Continue to assess and modify interventions until short term weight is achieved;Long Term: Adherence to nutrition and physical activity/exercise program aimed toward attainment of established weight goal;Weight Loss: Understanding of general recommendations for a balanced deficit meal plan, which promotes 1-2 lb weight loss per week and includes a negative energy balance of [phone] kcal/d;Understanding recommendations for meals to include 15-35% energy as protein, 25-35% energy from fat, 35-60% energy from carbohydrates, less than 227m of dietary cholesterol, 20-35 gm of total fiber daily;Understanding of distribution of calorie intake throughout the day with the consumption of 4-5 meals/snacks    Improve shortness of breath with ADL's Yes    Intervention Provide education, individualized exercise plan and daily activity instruction to help decrease symptoms of SOB with activities of daily living.    Expected Outcomes Short Term: Improve cardiorespiratory fitness to achieve a reduction of symptoms when performing ADLs;Long Term: Be able to perform more ADLs without symptoms or delay the onset of symptoms    Hypertension Yes    Intervention Provide education on lifestyle modifcations including regular physical activity/exercise, weight management, moderate sodium restriction and increased consumption of fresh fruit, vegetables, and low fat dairy, alcohol moderation, and smoking cessation.;Monitor prescription use compliance.    Expected Outcomes Long Term: Maintenance of blood pressure at goal levels.;Short Term: Continued assessment and intervention until BP is < 140/925mHG in hypertensive participants. < 130/8041mG in hypertensive participants with diabetes, heart failure or chronic kidney disease.    Lipids Yes    Intervention Provide education and support for participant on nutrition &  aerobic/resistive exercise along with prescribed medications to achieve LDL <25m7mDL >40mg88m Expected Outcomes Short Term: Participant states understanding of desired cholesterol values and is compliant with medications prescribed. Participant is following exercise prescription and nutrition guidelines.;Long Term: Cholesterol controlled with medications as prescribed, with individualized exercise RX and with personalized nutrition plan. Value goals: LDL <  41m, HDL > 40 mg.           Education:Diabetes - Individual verbal and written instruction to review signs/symptoms of diabetes, desired ranges of glucose level fasting, after meals and with exercise. Acknowledge that pre and post exercise glucose checks will be done for 3 sessions at entry of program.   Education: Know Your Numbers and Risk Factors: -Group verbal and written instruction about important numbers in your health.  Discussion of what are risk factors and how they play a role in the disease process.  Review of Cholesterol, Blood Pressure, Diabetes, and BMI and the role they play in your overall health.   Core Components/Risk Factors/Patient Goals Review:    Core Components/Risk Factors/Patient Goals at Discharge (Final Review):    ITP Comments:  ITP Comments    Row Name 04/29/20 0805           ITP Comments Completed 6MWT and gym orientation. Initial ITP created and sent for review to Dr. MEmily Filbert Medical Director. Documentaiton for diagnosis can be found in Care Everywhere encounter 04/10/2020.              Comments: Initial ITP

## 2020-05-01 ENCOUNTER — Encounter: Payer: Medicare Other | Attending: Pulmonary Disease | Admitting: *Deleted

## 2020-05-01 ENCOUNTER — Other Ambulatory Visit: Payer: Self-pay

## 2020-05-01 DIAGNOSIS — J45909 Unspecified asthma, uncomplicated: Secondary | ICD-10-CM | POA: Diagnosis not present

## 2020-05-01 DIAGNOSIS — J4550 Severe persistent asthma, uncomplicated: Secondary | ICD-10-CM

## 2020-05-01 DIAGNOSIS — I272 Pulmonary hypertension, unspecified: Secondary | ICD-10-CM

## 2020-05-01 NOTE — Progress Notes (Signed)
Daily Session Note  Patient Details  Name: Sharon Cervantes MRN: [id number] Date of Birth: [birth year] Referring Provider:     Pulmonary Rehab from 04/29/2020 in [facility] Cardiac and Pulmonary Rehab  Referring Provider Claudette Stapler MD      Encounter Date: 05/01/2020  Check In:  Session Check In - 05/01/20 0754      Check-In   Supervising physician immediately available to respond to emergencies See telemetry face sheet for immediately available ER MD    Location [facility]-Cardiac & Pulmonary Rehab    Staff Present Renita Papa, RN Margurite Auerbach, MS Exercise Physiologist;Jessica Luan Pulling, MA, RCEP, CCRP, CCET;Melissa Caiola RDN, LDN    Virtual Visit No    Medication changes reported     No    Fall or balance concerns reported    No    Warm-up and Cool-down Performed on first and last piece of equipment    Resistance Training Performed Yes    VAD Patient? No    PAD/SET Patient? No      Pain Assessment   Currently in Pain? No/denies              Social History   Tobacco Use  Smoking Status Never Smoker  Smokeless Tobacco Never Used    Goals Met:  Independence with exercise equipment Exercise tolerated well No report of cardiac concerns or symptoms Strength training completed today  Goals Unmet:  Not Applicable  Comments: First full day of exercise!  Patient was oriented to gym and equipment including functions, settings, policies, and procedures.  Patient's individual exercise prescription and treatment plan were reviewed.  All starting workloads were established based on the results of the 6 minute walk test done at initial orientation visit.  The plan for exercise progression was also introduced and progression will be customized based on patient's performance and goals.    Dr. Emily Filbert is Medical Director for [place] [name].

## 2020-05-06 ENCOUNTER — Encounter: Payer: Self-pay | Admitting: *Deleted

## 2020-05-06 ENCOUNTER — Telehealth: Payer: Self-pay | Admitting: *Deleted

## 2020-05-06 DIAGNOSIS — J4550 Severe persistent asthma, uncomplicated: Secondary | ICD-10-CM

## 2020-05-06 NOTE — Telephone Encounter (Signed)
pt fell and hurt knee at home yesterday

## 2020-05-07 ENCOUNTER — Encounter: Payer: Self-pay | Admitting: *Deleted

## 2020-05-07 DIAGNOSIS — J4550 Severe persistent asthma, uncomplicated: Secondary | ICD-10-CM

## 2020-05-07 NOTE — Progress Notes (Signed)
Cardiac Individual Treatment Plan  Patient Details  Name: Sharon Cervantes MRN: [id number] Date of Birth: February 05, 1951 Referring Provider:     Pulmonary Rehab from 04/29/2020 in [facility] Cardiac and Pulmonary Rehab  Referring Provider Claudette Stapler Sharon Cervantes      Initial Encounter Date:    Pulmonary Rehab from 04/29/2020 in [facility] Cardiac and Pulmonary Rehab  Date 04/29/20      Visit Diagnosis: Severe persistent asthma, unspecified whether complicated  Patient's Home Medications on Admission:  Current Outpatient Medications:  .  acetaminophen (TYLENOL) 500 MG tablet, Take 1-2 tablets (500-1,000 mg total) by mouth every 6 (six) hours as needed for mild pain (pain score 1-3 or temp > 100.5)., Disp: , Rfl:  .  albuterol (PROVENTIL HFA;VENTOLIN HFA) 108 (90 Base) MCG/ACT inhaler, Inhale 1-2 puffs into the lungs every 6 (six) hours as needed for wheezing or shortness of breath. , Disp: , Rfl:  .  budesonide (PULMICORT) 0.5 MG/2ML nebulizer solution, Take 0.5 mg by nebulization every 6 (six) hours as needed (shortness of breath or wheezing). , Disp: , Rfl:  .  budesonide (PULMICORT) 0.5 MG/2ML nebulizer solution, Inhale into the lungs., Disp: , Rfl:  .  cyanocobalamin (,VITAMIN B-12,) 1000 MCG/ML injection, Inject 1,000 mcg into the muscle every 30 (thirty) days., Disp: , Rfl:  .  docusate sodium (COLACE) 100 MG capsule, Take 1 capsule (100 mg total) by mouth 2 (two) times daily., Disp: 10 capsule, Rfl: 0 .  Dupilumab (DUPIXENT) 300 MG/2ML SOPN, Inject 300 mg into the skin every 14 (fourteen) days. JUST HAD DOSE TODAY 6-3 (Patient not taking: Reported on 04/29/2020), Disp: , Rfl:  .  enoxaparin (LOVENOX) 40 MG/0.4ML injection, Inject 0.4 mLs (40 mg total) into the skin daily for 14 days., Disp: 5.6 mL, Rfl: 0 .  esomeprazole (NEXIUM) 40 MG capsule, Take by mouth., Disp: , Rfl:  .  fluticasone (FLONASE) 50 MCG/ACT nasal spray, Place into the nose., Disp: , Rfl:  .  fluticasone (FLOVENT HFA) 220 MCG/ACT inhaler,  Inhale into the lungs., Disp: , Rfl:  .  gabapentin (NEURONTIN) 300 MG capsule, Take by mouth., Disp: , Rfl:  .  hydrochlorothiazide (HYDRODIURIL) 12.5 MG tablet, Take 12.5 mg by mouth daily. , Disp: , Rfl:  .  hydrochlorothiazide (HYDRODIURIL) 12.5 MG tablet, Take by mouth., Disp: , Rfl:  .  methocarbamol (ROBAXIN) 500 MG tablet, Take 1 tablet (500 mg total) by mouth every 6 (six) hours as needed for muscle spasms., Disp: 210 tablet, Rfl: 0 .  montelukast (SINGULAIR) 10 MG tablet, Take 1 tablet (10 mg total) by mouth at bedtime. appt further refills (Patient taking differently: Take 10 mg by mouth at bedtime. ), Disp: 90 tablet, Rfl: 1 .  montelukast (SINGULAIR) 10 MG tablet, Take by mouth., Disp: , Rfl:  .  omeprazole (PRILOSEC) 40 MG capsule, Take 40 mg by mouth at bedtime., Disp: , Rfl:  .  oxyCODONE (OXY IR/ROXICODONE) 5 MG immediate release tablet, Take 1-2 tablets (5-10 mg total) by mouth every 4 (four) hours as needed for moderate pain (pain score 4-6). (Patient not taking: Reported on 04/29/2020), Disp: 40 tablet, Rfl: 0 .  pramipexole (MIRAPEX) 1.5 MG tablet, Take 1.5 mg by mouth at bedtime. , Disp: , Rfl:  .  venlafaxine XR (EFFEXOR-XR) 150 MG 24 hr capsule, Take 1 capsule (150 mg total) by mouth daily with breakfast. (Patient not taking: Reported on 04/29/2020), Disp: 90 capsule, Rfl: 3 .  venlafaxine XR (EFFEXOR-XR) 75 MG 24 hr capsule, Take 225 mg  by mouth daily with breakfast., Disp: , Rfl:  No current facility-administered medications for this visit.  Facility-Administered Medications Ordered in Other Visits:  .  [COMPLETED] sodium chloride 0.9 % nebulizer solution 3 mL, 3 mL, Nebulization, Once, 3 mL at 10/03/18 1130 **FOLLOWED BY** [COMPLETED] methacholine (PROVOCHOLINE) inhaler solution 0.125 mg, 2 mL, Inhalation, Once, 0.125 mg at 10/03/18 1139 **FOLLOWED BY** methacholine (PROVOCHOLINE) inhaler solution 0.5 mg, 2 mL, Inhalation, Once **FOLLOWED BY** methacholine (PROVOCHOLINE)  inhaler solution 2 mg, 2 mL, Inhalation, Once **FOLLOWED BY** methacholine (PROVOCHOLINE) inhaler solution 8 mg, 2 mL, Inhalation, Once **FOLLOWED BY** methacholine (PROVOCHOLINE) inhaler solution 32 mg, 2 mL, Inhalation, Once **FOLLOWED BY** [COMPLETED] albuterol (PROVENTIL) (2.5 MG/3ML) 0.083% nebulizer solution 2.5 mg, 2.5 mg, Nebulization, Once, Sharon Glazier, Sharon Cervantes, 2.5 mg at 10/03/18 1146  Past Medical History: Past Medical History:  Diagnosis Date  . Arthritis   . Asthma   . Chronic cough   . Colon polyps   . Depression   . GERD (gastroesophageal reflux disease)   . Hiatal hernia    LARGE  . History of chicken pox   . Hypertension   . Macular degeneration   . Multinodular goiter    FNA in past neg h/o thyroid cysts  . Pernicious anemia    PERNICIOUS   . RLS (restless legs syndrome)   . Sleep apnea    NO CPAP    Tobacco Use: Social History   Tobacco Use  Smoking Status Never Smoker  Smokeless Tobacco Never Used    Labs: Recent Review Scientist, physiological    Labs for ITP Cardiac and Pulmonary Rehab Latest Ref Rng & Units 10/12/2018   Hemoglobin A1c 4.6 - 6.5 % 5.6       Exercise Target Goals: Exercise Program Goal: Individual exercise prescription set using results from initial 6 min walk test and THRR while considering  patient's activity barriers and safety.   Exercise Prescription Goal: Initial exercise prescription builds to 30-45 minutes a day of aerobic activity, 2-3 days per week.  Home exercise guidelines will be given to patient during program as part of exercise prescription that the participant will acknowledge.   Education: Aerobic Exercise & Resistance Training: - Gives group verbal and written instruction on the various components of exercise. Focuses on aerobic and resistive training programs and the benefits of this training and how to safely progress through these programs..   Education: Exercise & Equipment Safety: - Individual verbal instruction  and demonstration of equipment use and safety with use of the equipment.   Pulmonary Rehab from 04/29/2020 in [facility] Cardiac and Pulmonary Rehab  Date 04/29/20  Educator [facility]  Instruction Review Code 1- Verbalizes Understanding      Education: Exercise Physiology & General Exercise Guidelines: - Group verbal and written instruction with models to review the exercise physiology of the cardiovascular system and associated critical values. Provides general exercise guidelines with specific guidelines to those with heart or lung disease.    Education: Flexibility, Balance, Mind/Body Relaxation: Provides group verbal/written instruction on the benefits of flexibility and balance training, including mind/body exercise modes such as yoga, pilates and tai chi.  Demonstration and skill practice provided.   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 HR 108 bpm     Max Ex. BP 144/66     2 Minute Post BP 136/70       Interval HR   1 Minute HR 100     2 Minute HR 98     3 Minute HR 100     4 Minute HR 106     5 Minute HR 101     6 Minute HR 108     2 Minute Post HR 86     Interval Heart Rate? Yes       Interval Oxygen   Interval  Oxygen? Yes     Baseline Oxygen Saturation % 95 %     1 Minute Oxygen Saturation % 91 %     1 Minute Liters of Oxygen 0 L  Room Air     2 Minute Oxygen Saturation % 89 %     2 Minute Liters of Oxygen 0 L     3 Minute Oxygen Saturation % 92 %     3 Minute Liters of Oxygen 0 L     4 Minute Oxygen Saturation % 93 %     4 Minute Liters of Oxygen 0 L     5 Minute Oxygen Saturation % 94 %     5 Minute Liters of Oxygen 0 L     6 Minute Oxygen Saturation % 94 %     6 Minute Liters of Oxygen 0 L     2 Minute Post Oxygen Saturation % 96 %     2 Minute Post Liters of Oxygen 0 L            Oxygen Initial Assessment:  Oxygen Initial Assessment - 04/29/20 0756      Home Oxygen   Home Oxygen Device None    Sleep Oxygen Prescription None    Home Exercise Oxygen Prescription None    Home at Rest Exercise Oxygen Prescription None      Initial 6 min Walk   Oxygen Used None      Program Oxygen Prescription   Program Oxygen Prescription None      Intervention   Short Term Goals To learn and understand importance of monitoring SPO2 with pulse oximeter and demonstrate accurate use of the pulse oximeter.;To learn and understand importance of maintaining oxygen saturations>88%;To learn and demonstrate proper pursed lip breathing techniques or other breathing techniques.;To learn and demonstrate proper use of respiratory medications    Long  Term Goals Verbalizes importance of monitoring SPO2 with pulse oximeter and return demonstration;Maintenance of O2 saturations>88%;Exhibits proper breathing techniques, such as pursed lip breathing or other method taught during program session;Compliance with  respiratory medication;Demonstrates proper use of MDI's           Oxygen Re-Evaluation:   Oxygen Discharge (Final Oxygen Re-Evaluation):   Initial Exercise Prescription:  Initial Exercise Prescription - 04/29/20 0900      Date of Initial Exercise RX and Referring Provider   Date 04/29/20     Referring Provider Claudette Stapler Sharon Cervantes      Treadmill   MPH 2    Grade 0.5    Minutes 15    METs 2.67      REL-XR   Level 1    Speed 50    Minutes 15    METs 2.6      T5 Nustep   Level 2    SPM 80    Minutes 15    METs 2.6      Prescription Details   Frequency (times per week) 2    Duration Progress to 30 minutes of continuous aerobic without signs/symptoms of physical distress      Intensity   THRR 40-80% of Max Heartrate 106-135    Ratings of Perceived Exertion 11-13    Perceived Dyspnea 0-4      Progression   Progression Continue to progress workloads to maintain intensity without signs/symptoms of physical distress.      Resistance Training   Training Prescription Yes    Weight 3 lb    Reps 10-15           Perform Capillary Blood Glucose checks as needed.  Exercise Prescription Changes:  Exercise Prescription Changes    Row Name 04/29/20 0900             Response to Exercise   Blood Pressure (Admit) 134/74       Blood Pressure (Exercise) 144/66       Blood Pressure (Exit) 128/60       Heart Rate (Admit) 76 bpm       Heart Rate (Exercise) 108 bpm       Heart Rate (Exit) 86 bpm       Oxygen Saturation (Admit) 95 %       Oxygen Saturation (Exercise) 89 %       Oxygen Saturation (Exit) 94 %       Rating of Perceived Exertion (Exercise) 13       Perceived Dyspnea (Exercise) 2       Symptoms SOB       Comments walk test results              Exercise Comments:   Exercise Goals and Review:  Exercise Goals    Row Name 04/29/20 0909             Exercise Goals   Increase Physical Activity Yes       Intervention Provide advice, education, support and counseling about physical activity/exercise needs.;Develop an individualized exercise prescription for aerobic and resistive training based on initial evaluation findings, risk stratification, comorbidities and participant's personal goals.       Expected Outcomes Short Term: Attend rehab on a  regular basis to increase amount of physical activity.;Long Term: Add in home exercise to make exercise part of routine and to increase amount of physical activity.;Long Term: Exercising regularly at least 3-5 days a week.       Increase Strength and Stamina Yes       Intervention Provide advice, education, support and counseling about physical activity/exercise needs.;Develop an individualized exercise prescription for aerobic and resistive training based on initial evaluation  findings, risk stratification, comorbidities and participant's personal goals.       Expected Outcomes Short Term: Increase workloads from initial exercise prescription for resistance, speed, and METs.;Short Term: Perform resistance training exercises routinely during rehab and add in resistance training at home;Long Term: Improve cardiorespiratory fitness, muscular endurance and strength as measured by increased METs and functional capacity (6MWT)       Able to understand and use rate of perceived exertion (RPE) scale Yes       Intervention Provide education and explanation on how to use RPE scale       Expected Outcomes Short Term: Able to use RPE daily in rehab to express subjective intensity level;Long Term:  Able to use RPE to guide intensity level when exercising independently       Able to understand and use Dyspnea scale Yes       Intervention Provide education and explanation on how to use Dyspnea scale       Expected Outcomes Short Term: Able to use Dyspnea scale daily in rehab to express subjective sense of shortness of breath during exertion;Long Term: Able to use Dyspnea scale to guide intensity level when exercising independently       Knowledge and understanding of Target Heart Rate Range (THRR) Yes       Intervention Provide education and explanation of THRR including how the numbers were predicted and where they are located for reference       Expected Outcomes Short Term: Able to state/look up THRR;Long Term:  Able to use THRR to govern intensity when exercising independently;Short Term: Able to use daily as guideline for intensity in rehab       Able to check pulse independently Yes       Intervention Provide education and demonstration on how to check pulse in carotid and radial arteries.;Review the importance of being able to check your own pulse for safety during independent exercise       Expected Outcomes Short Term: Able to explain why pulse checking is important during independent exercise;Long Term: Able to check pulse independently and accurately       Understanding of Exercise Prescription Yes       Intervention Provide education, explanation, and written materials on patient's individual exercise prescription       Expected Outcomes Short Term: Able to explain program exercise prescription;Long Term: Able to explain home exercise prescription to exercise independently              Exercise Goals Re-Evaluation :  Exercise Goals Re-Evaluation    Row Name 05/01/20 0755             Exercise Goal Re-Evaluation   Exercise Goals Review Increase Physical Activity;Able to understand and use rate of perceived exertion (RPE) scale;Knowledge and understanding of Target Heart Rate Range (THRR);Understanding of Exercise Prescription;Able to check pulse independently;Increase Strength and Stamina       Comments Reviewed RPE and dyspnea scales, THR and program prescription with pt today.  Pt voiced understanding and was given a copy of goals to take home.       Expected Outcomes Short: Use RPE daily to regulate intensity. Long: Follow program prescription in THR.              Discharge Exercise Prescription (Final Exercise Prescription Changes):  Exercise Prescription Changes - 04/29/20 0900      Response to Exercise   Blood Pressure (Admit) 134/74    Blood Pressure (Exercise) 144/66    Blood  Pressure (Exit) 128/60    Heart Rate (Admit) 76 bpm    Heart Rate (Exercise) 108 bpm    Heart  Rate (Exit) 86 bpm    Oxygen Saturation (Admit) 95 %    Oxygen Saturation (Exercise) 89 %    Oxygen Saturation (Exit) 94 %    Rating of Perceived Exertion (Exercise) 13    Perceived Dyspnea (Exercise) 2    Symptoms SOB    Comments walk test results           Nutrition:  Target Goals: Understanding of nutrition guidelines, daily intake of sodium <1552m, cholesterol <2053m calories 30% from fat and 7% or less from saturated fats, daily to have 5 or more servings of fruits and vegetables.  Education: Controlling Sodium/Reading Food Labels -Group verbal and written material supporting the discussion of sodium use in heart healthy nutrition. Review and explanation with models, verbal and written materials for utilization of the food label.   Education: General Nutrition Guidelines/Fats and Fiber: -Group instruction provided by verbal, written material, models and posters to present the general guidelines for heart healthy nutrition. Gives an explanation and review of dietary fats and fiber.   Biometrics:  Pre Biometrics - 04/29/20 0909      Pre Biometrics   Height 5' 5.7" (1.669 m)    Weight 251 lb 1.6 oz (113.9 kg)    BMI (Calculated) 40.89    Single Leg Stand 6.47 seconds            Nutrition Therapy Plan and Nutrition Goals:   Nutrition Assessments:  Nutrition Assessments - 04/29/20 0851      MEDFICTS Scores   Pre Score 42           MEDIFICTS Score Key:          ?70 Need to make dietary changes          40-70 Heart Healthy Diet         ? 40 Therapeutic Level Cholesterol Diet  Nutrition Goals Re-Evaluation:   Nutrition Goals Discharge (Final Nutrition Goals Re-Evaluation):   Psychosocial: Target Goals: Acknowledge presence or absence of significant depression and/or stress, maximize coping skills, provide positive support system. Participant is able to verbalize types and ability to use techniques and skills needed for reducing stress and depression.    Education: Depression - Provides group verbal and written instruction on the correlation between heart/lung disease and depressed mood, treatment options, and the stigmas associated with seeking treatment.   Education: Sleep Hygiene -Provides group verbal and written instruction about how sleep can affect your health.  Define sleep hygiene, discuss sleep cycles and impact of sleep habits. Review good sleep hygiene tips.     Education: Stress and Anxiety: - Provides group verbal and written instruction about the health risks of elevated stress and causes of high stress.  Discuss the correlation between heart/lung disease and anxiety and treatment options. Review healthy ways to manage with stress and anxiety.    Initial Review & Psychosocial Screening:  Initial Psych Review & Screening - 04/29/20 0758      Initial Review   Current issues with Current Stress Concerns;History of Depression;Current Depression;Current Sleep Concerns    Source of Stress Concerns Chronic Illness;Financial    Comments PHQ is 5, no feeling of depression currently, not sleeping great 4-5 hours a night (going to bed late and up early), finances have gotten better      FaWaldoYes   FrSharla Kidney  lives near by, [place] friend April (moving this weekend)   Concerns Inappropriate over/under dependence on family/friends      Barriers   Psychosocial barriers to participate in program The patient should benefit from training in stress management and relaxation.;Psychosocial barriers identified (see note)      Screening Interventions   Interventions Provide feedback about the scores to participant;To provide support and resources with identified psychosocial needs;Encouraged to exercise    Expected Outcomes Short Term goal: Utilizing psychosocial counselor, staff and physician to assist with identification of specific Stressors or current issues interfering with healing process. Setting  desired goal for each stressor or current issue identified.;Long Term Goal: Stressors or current issues are controlled or eliminated.;Short Term goal: Identification and review with participant of any Quality of Life or Depression concerns found by scoring the questionnaire.;Long Term goal: The participant improves quality of Life and PHQ9 Scores as seen by post scores and/or verbalization of changes           Quality of Life Scores:   Scores of 19 and below usually indicate a poorer quality of life in these areas.  A difference of  2-3 points is a clinically meaningful difference.  A difference of 2-3 points in the total score of the Quality of Life Index has been associated with significant improvement in overall quality of life, self-image, physical symptoms, and general health in studies assessing change in quality of life.  PHQ-9: Recent Review Flowsheet Data    Depression screen [facility] 2/9 04/29/2020 12/24/2019 12/10/2019 10/29/2019 10/11/2019   Decreased Interest 0 0 2 0 1   Down, Depressed, Hopeless 0 0 3 0 1   PHQ - 2 Score 0 0 5 0 2   Altered sleeping _0 0 3   Tired, decreased energy _1 0 3   Change in appetite 0 0 0 0 3   Feeling bad or failure about yourself  0 0 1 0 1   Trouble concentrating _2 0 1   Moving slowly or fidgety/restless 0 0 0 0 0   Suicidal thoughts 0 0 0 0 0   PHQ-9 Score _3 0 13   Difficult doing work/chores Somewhat difficult Not difficult at all Somewhat difficult Not difficult at all Somewhat difficult     Interpretation of Total Score  Total Score Depression Severity:  1-4 = Minimal depression, 5-9 = Mild depression, 10-14 = Moderate depression, 15-19 = Moderately severe depression, 20-27 = Severe depression   Psychosocial Evaluation and Intervention:  Psychosocial Evaluation - 04/29/20 0800      Psychosocial Evaluation & Interventions   Interventions Stress management education;Encouraged to exercise with the program and follow exercise  prescription    Comments Destynie is coming back to pulmonary rehab with her asthma that has gotten worse since surgery.  She wants to focus on getting on her nebulizer and get back to walking longer.  She has a few new friends, but one is moving away this weekend.  She wants to get her weight back down too. Her finances are doing better.  Sleep continues to struggle for her.    Expected Outcomes Short: Attend rehab to improve breathing and endurance  Long: Continue to focus on positive and use mental boost    Continue Psychosocial Services  Follow up required by staff           Psychosocial Re-Evaluation:   Psychosocial Discharge (Final Psychosocial Re-Evaluation):   Vocational Rehabilitation: Provide vocational rehab  assistance to qualifying candidates.   Vocational Rehab Evaluation & Intervention:  Vocational Rehab - 04/29/20 0803      Initial Vocational Rehab Evaluation & Intervention   Assessment shows need for Vocational Rehabilitation No           Education: Education Goals: Education classes will be provided on a variety of topics geared toward better understanding of heart health and risk factor modification. Participant will state understanding/return demonstration of topics presented as noted by education test scores.  Learning Barriers/Preferences:  Learning Barriers/Preferences - 04/29/20 0803      Learning Barriers/Preferences   Learning Barriers None    Learning Preferences None           General Cardiac Education Topics:  AED/CPR: - Group verbal and written instruction with the use of models to demonstrate the basic use of the AED with the basic ABC's of resuscitation.   Anatomy & Physiology of the Heart: - Group verbal and written instruction and models provide basic cardiac anatomy and physiology, with the coronary electrical and arterial systems. Review of Valvular disease and Heart Failure   Cardiac Procedures: - Group verbal and written  instruction to review commonly prescribed medications for heart disease. Reviews the medication, class of the drug, and side effects. Includes the steps to properly store meds and maintain the prescription regimen. (beta blockers and nitrates)   Cardiac Medications I: - Group verbal and written instruction to review commonly prescribed medications for heart disease. Reviews the medication, class of the drug, and side effects. Includes the steps to properly store meds and maintain the prescription regimen.   Cardiac Medications II: -Group verbal and written instruction to review commonly prescribed medications for heart disease. Reviews the medication, class of the drug, and side effects. (all other drug classes)    Go Sex-Intimacy & Heart Disease, Get SMART - Goal Setting: - Group verbal and written instruction through game format to discuss heart disease and the return to sexual intimacy. Provides group verbal and written material to discuss and apply goal setting through the application of the S.M.A.R.T. Method.   Other Matters of the Heart: - Provides group verbal, written materials and models to describe Stable Angina and Peripheral Artery. Includes description of the disease process and treatment options available to the cardiac patient.   Infection Prevention: - Provides verbal and written material to individual with discussion of infection control including proper hand washing and proper equipment cleaning during exercise session.   Pulmonary Rehab from 04/29/2020 in [facility] Cardiac and Pulmonary Rehab  Date 04/29/20  Educator [facility]  Instruction Review Code 1- Verbalizes Understanding      Falls Prevention: - Provides verbal and written material to individual with discussion of falls prevention and safety.   Pulmonary Rehab from 04/29/2020 in [facility] Cardiac and Pulmonary Rehab  Date 04/29/20  Educator [facility]  Instruction Review Code 1- Verbalizes Understanding      Other: -Provides  group and verbal instruction on various topics (see comments)   Knowledge Questionnaire Score:  Knowledge Questionnaire Score - 04/29/20 0851      Knowledge Questionnaire Score   Pre Score 16/18 Education Focus: O2 safety           Core Components/Risk Factors/Patient Goals at Admission:  Personal Goals and Risk Factors at Admission - 04/29/20 0803      Core Components/Risk Factors/Patient Goals on Admission    Weight Management Yes;Obesity;Weight Loss    Intervention Weight Management: Develop a combined nutrition and exercise program designed to  reach desired caloric intake, while maintaining appropriate intake of nutrient and fiber, sodium and fats, and appropriate energy expenditure required for the weight goal.;Weight Management/Obesity: Establish reasonable short term and long term weight goals.;Weight Management: Provide education and appropriate resources to help participant work on and attain dietary goals.;Obesity: Provide education and appropriate resources to help participant work on and attain dietary goals.    Admit Weight 251 lb 1.6 oz (113.9 kg)    Goal Weight: Short Term 245 lb (111.1 kg)    Goal Weight: Long Term 240 lb (108.9 kg)    Expected Outcomes Short Term: Continue to assess and modify interventions until short term weight is achieved;Long Term: Adherence to nutrition and physical activity/exercise program aimed toward attainment of established weight goal;Weight Loss: Understanding of general recommendations for a balanced deficit meal plan, which promotes 1-2 lb weight loss per week and includes a negative energy balance of [phone] kcal/d;Understanding recommendations for meals to include 15-35% energy as protein, 25-35% energy from fat, 35-60% energy from carbohydrates, less than 28m of dietary cholesterol, 20-35 gm of total fiber daily;Understanding of distribution of calorie intake throughout the day with the consumption of 4-5 meals/snacks    Improve shortness  of breath with ADL's Yes    Intervention Provide education, individualized exercise plan and daily activity instruction to help decrease symptoms of SOB with activities of daily living.    Expected Outcomes Short Term: Improve cardiorespiratory fitness to achieve a reduction of symptoms when performing ADLs;Long Term: Be able to perform more ADLs without symptoms or delay the onset of symptoms    Hypertension Yes    Intervention Provide education on lifestyle modifcations including regular physical activity/exercise, weight management, moderate sodium restriction and increased consumption of fresh fruit, vegetables, and low fat dairy, alcohol moderation, and smoking cessation.;Monitor prescription use compliance.    Expected Outcomes Long Term: Maintenance of blood pressure at goal levels.;Short Term: Continued assessment and intervention until BP is < 140/938mHG in hypertensive participants. < 130/8061mG in hypertensive participants with diabetes, heart failure or chronic kidney disease.    Lipids Yes    Intervention Provide education and support for participant on nutrition & aerobic/resistive exercise along with prescribed medications to achieve LDL <44m59mDL >40mg21m Expected Outcomes Short Term: Participant states understanding of desired cholesterol values and is compliant with medications prescribed. Participant is following exercise prescription and nutrition guidelines.;Long Term: Cholesterol controlled with medications as prescribed, with individualized exercise RX and with personalized nutrition plan. Value goals: LDL < 44mg,36m > 40 mg.           Education:Diabetes - Individual verbal and written instruction to review signs/symptoms of diabetes, desired ranges of glucose level fasting, after meals and with exercise. Acknowledge that pre and post exercise glucose checks will be done for 3 sessions at entry of program.   Education: Know Your Numbers and Risk Factors: -Group verbal  and written instruction about important numbers in your health.  Discussion of what are risk factors and how they play a role in the disease process.  Review of Cholesterol, Blood Pressure, Diabetes, and BMI and the role they play in your overall health.   Core Components/Risk Factors/Patient Goals Review:    Core Components/Risk Factors/Patient Goals at Discharge (Final Review):    ITP Comments:  ITP Comments    Row Name 04/29/20 0805 05/01/20 0755 05/06/20 0717 05/07/20 1548     ITP Comments Completed 6MWT and gym orientation. Initial ITP created and sent for  review to Dr. Emily Filbert, Medical Director. Documentaiton for diagnosis can be found in Care Everywhere encounter 04/10/2020. First full day of exercise!  Patient was oriented to gym and equipment including functions, settings, policies, and procedures.  Patient's individual exercise prescription and treatment plan were reviewed.  All starting workloads were established based on the results of the 6 minute walk test done at initial orientation visit.  The plan for exercise progression was also introduced and progression will be customized based on patient's performance and goals pt fell and hurt knee at home yesterday 30 day review completed. ITP sent to Dr. Emily Filbert, Medical Director of Cardiac and Pulmonary Rehab. Continue with ITP unless changes are made by physician.           Comments: 30 day review

## 2020-05-08 ENCOUNTER — Encounter: Payer: Medicare Other | Admitting: *Deleted

## 2020-05-08 ENCOUNTER — Other Ambulatory Visit: Payer: Self-pay

## 2020-05-08 DIAGNOSIS — J4550 Severe persistent asthma, uncomplicated: Secondary | ICD-10-CM

## 2020-05-08 DIAGNOSIS — J45909 Unspecified asthma, uncomplicated: Secondary | ICD-10-CM | POA: Diagnosis not present

## 2020-05-08 NOTE — Progress Notes (Signed)
Daily Session Note  Patient Details  Name: Sharon Cervantes MRN: [id number] Date of Birth: [birth year] Referring Provider:     Pulmonary Rehab from 04/29/2020 in [facility] Cardiac and Pulmonary Rehab  Referring Provider Claudette Stapler MD      Encounter Date: 05/08/2020  Check In:  Session Check In - 05/08/20 0840      Check-In   Supervising physician immediately available to respond to emergencies See telemetry face sheet for immediately available ER MD    Location [facility]-Cardiac & Pulmonary Rehab    Staff Present Heath Lark, RN, BSN, Jacklynn Bue, MS Exercise Physiologist    Virtual Visit No    Medication changes reported     No    Fall or balance concerns reported    No    Warm-up and Cool-down Performed on first and last piece of equipment    Resistance Training Performed Yes    VAD Patient? No    PAD/SET Patient? No      Pain Assessment   Currently in Pain? No/denies              Social History   Tobacco Use  Smoking Status Never Smoker  Smokeless Tobacco Never Used    Goals Met:  Proper associated with RPD/PD & O2 Sat Independence with exercise equipment Exercise tolerated well No report of cardiac concerns or symptoms  Goals Unmet:  Not Applicable  Comments: Pt able to follow exercise prescription today without complaint.  Will continue to monitor for progression.    Dr. Emily Filbert is Medical Director for [place] and [name].

## 2020-05-13 ENCOUNTER — Encounter: Payer: Medicare Other | Admitting: *Deleted

## 2020-05-13 ENCOUNTER — Other Ambulatory Visit: Payer: Self-pay

## 2020-05-13 DIAGNOSIS — J4550 Severe persistent asthma, uncomplicated: Secondary | ICD-10-CM

## 2020-05-13 DIAGNOSIS — J45909 Unspecified asthma, uncomplicated: Secondary | ICD-10-CM | POA: Diagnosis not present

## 2020-05-13 NOTE — Progress Notes (Signed)
Daily Session Note  Patient Details  Name: Sharon Cervantes MRN: [id number] Date of Birth: Jan 02, 1951 Referring Provider:     Pulmonary Rehab from 04/29/2020 in [facility] Cardiac and Pulmonary Rehab  Referring Provider Claudette Stapler MD      Encounter Date: 05/13/2020  Check In:  Session Check In - 05/13/20 0848      Check-In   Supervising physician immediately available to respond to emergencies See telemetry face sheet for immediately available ER MD    Location [facility]-Cardiac & Pulmonary Rehab    Staff Present Heath Lark, RN, BSN, Jacklynn Bue, MS Exercise Physiologist;Amanda Oletta Darter, [place], ACSM CEP, Exercise Physiologist    Virtual Visit No    Medication changes reported     No    Fall or balance concerns reported    No    Warm-up and Cool-down Performed on first and last piece of equipment    Resistance Training Performed Yes    VAD Patient? No    PAD/SET Patient? No      Pain Assessment   Currently in Pain? No/denies              Social History   Tobacco Use  Smoking Status Never Smoker  Smokeless Tobacco Never Used    Goals Met:  Proper associated with RPD/PD & O2 Sat Independence with exercise equipment Exercise tolerated well No report of cardiac concerns or symptoms  Goals Unmet:  Not Applicable  Comments: Pt able to follow exercise prescription today without complaint.  Will continue to monitor for progression.    Dr. Emily Filbert is Medical Director for [place] and [name].

## 2020-05-20 ENCOUNTER — Other Ambulatory Visit: Payer: Self-pay

## 2020-05-20 ENCOUNTER — Encounter: Payer: Medicare Other | Admitting: *Deleted

## 2020-05-20 DIAGNOSIS — J4550 Severe persistent asthma, uncomplicated: Secondary | ICD-10-CM

## 2020-05-20 DIAGNOSIS — J45909 Unspecified asthma, uncomplicated: Secondary | ICD-10-CM | POA: Diagnosis not present

## 2020-05-20 NOTE — Progress Notes (Signed)
Daily Session Note  Patient Details  Name: Sharon Cervantes MRN: [id number] Date of Birth: 26-Oct-1950 Referring Provider:     Pulmonary Rehab from 04/29/2020 in [facility] Cardiac and Pulmonary Rehab  Referring Provider Claudette Stapler MD      Encounter Date: 05/20/2020  Check In:  Session Check In - 05/20/20 0842      Check-In   Supervising physician immediately available to respond to emergencies See telemetry face sheet for immediately available ER MD    Location [facility]-Cardiac & Pulmonary Rehab    Staff Present Heath Lark, RN, BSN, Jacklynn Bue, MS Exercise Physiologist;Amanda Oletta Darter, [place], ACSM CEP, Exercise Physiologist    Virtual Visit No    Medication changes reported     No    Fall or balance concerns reported    No    Warm-up and Cool-down Performed on first and last piece of equipment    Resistance Training Performed Yes    VAD Patient? No    PAD/SET Patient? No      Pain Assessment   Currently in Pain? No/denies              Social History   Tobacco Use  Smoking Status Never Smoker  Smokeless Tobacco Never Used    Goals Met:  Proper associated with RPD/PD & O2 Sat Independence with exercise equipment Exercise tolerated well No report of cardiac concerns or symptoms  Goals Unmet:  Not Applicable  Comments: Pt able to follow exercise prescription today without complaint.  Will continue to monitor for progression.    Dr. Emily Filbert is Medical Director for [place] and [name].

## 2020-05-21 DIAGNOSIS — K219 Gastro-esophageal reflux disease without esophagitis: Secondary | ICD-10-CM | POA: Insufficient documentation

## 2020-05-22 ENCOUNTER — Encounter: Payer: Medicare Other | Admitting: *Deleted

## 2020-05-22 ENCOUNTER — Other Ambulatory Visit: Payer: Self-pay

## 2020-05-22 DIAGNOSIS — J45909 Unspecified asthma, uncomplicated: Secondary | ICD-10-CM | POA: Diagnosis not present

## 2020-05-22 DIAGNOSIS — J4550 Severe persistent asthma, uncomplicated: Secondary | ICD-10-CM

## 2020-05-22 NOTE — Progress Notes (Signed)
Daily Session Note  Patient Details  Name: Teanna Elem MRN: [id number] Date of Birth: 23-Jul-1951 Referring Provider:     Pulmonary Rehab from 04/29/2020 in [facility] Cardiac and Pulmonary Rehab  Referring Provider Claudette Stapler MD      Encounter Date: 05/22/2020  Check In:  Session Check In - 05/22/20 0825      Check-In   Supervising physician immediately available to respond to emergencies See telemetry face sheet for immediately available ER MD    Location [facility]-Cardiac & Pulmonary Rehab    Staff Present Heath Lark, RN, BSN, CCRP;Melissa Fontenelle RDN, Rowe Pavy, BA, ACSM CEP, Exercise Physiologist    Virtual Visit No    Medication changes reported     No    Fall or balance concerns reported    No    Warm-up and Cool-down Performed on first and last piece of equipment    Resistance Training Performed Yes    VAD Patient? No    PAD/SET Patient? No      Pain Assessment   Currently in Pain? No/denies              Social History   Tobacco Use  Smoking Status Never Smoker  Smokeless Tobacco Never Used    Goals Met:  Proper associated with RPD/PD & O2 Sat Independence with exercise equipment Exercise tolerated well No report of cardiac concerns or symptoms  Goals Unmet:  Not Applicable  Comments: Pt able to follow exercise prescription today without complaint.  Will continue to monitor for progression.    Dr. Emily Filbert is Medical Director for [place] and [name].

## 2020-05-26 ENCOUNTER — Telehealth: Payer: Self-pay

## 2020-05-26 NOTE — Telephone Encounter (Signed)
Called pt for nutrition appointment - mailbox full. Will reschedule.

## 2020-05-27 ENCOUNTER — Encounter: Payer: Medicare Other | Admitting: *Deleted

## 2020-05-27 ENCOUNTER — Other Ambulatory Visit: Payer: Self-pay

## 2020-05-27 DIAGNOSIS — J45909 Unspecified asthma, uncomplicated: Secondary | ICD-10-CM | POA: Diagnosis not present

## 2020-05-27 DIAGNOSIS — J4550 Severe persistent asthma, uncomplicated: Secondary | ICD-10-CM

## 2020-05-27 NOTE — Progress Notes (Signed)
Daily Session Note  Patient Details  Name: Sharon Cervantes MRN: [id number] Date of Birth: 01/01/1951 Referring Provider:     Pulmonary Rehab from 04/29/2020 in [facility] Cardiac and Pulmonary Rehab  Referring Provider Aleskerov, Fred MD      Encounter Date: 05/27/2020  Check In:  Session Check In - 05/27/20 0842      Check-In   Supervising physician immediately available to respond to emergencies See telemetry face sheet for immediately available ER MD    Location [facility]-Cardiac & Pulmonary Rehab    Staff Present Krista Spencer, RN BSN;Amanda Sommer, BA, ACSM CEP, Exercise Physiologist;Susanne Bice, RN, BSN, CCRP    Virtual Visit No    Medication changes reported     No    Fall or balance concerns reported    No    Warm-up and Cool-down Performed on first and last piece of equipment    Resistance Training Performed Yes    VAD Patient? No    PAD/SET Patient? No      Pain Assessment   Currently in Pain? No/denies              Social History   Tobacco Use  Smoking Status Never Smoker  Smokeless Tobacco Never Used    Goals Met:  Independence with exercise equipment Exercise tolerated well No report of cardiac concerns or symptoms  Goals Unmet:  Not Applicable  Comments: Pt able to follow exercise prescription today without complaint.  Will continue to monitor for progression.    Dr. Mark Miller is Medical Director for [facility] and [name]. 

## 2020-06-04 ENCOUNTER — Encounter: Payer: Self-pay | Admitting: *Deleted

## 2020-06-04 DIAGNOSIS — J4550 Severe persistent asthma, uncomplicated: Secondary | ICD-10-CM

## 2020-06-04 NOTE — Progress Notes (Signed)
Pulmonary Individual Treatment Plan  Patient Details  Name: Sharon Cervantes MRN: [id number] Date of Birth: Oct 21, 1950 Referring Provider:     Pulmonary Rehab from 04/29/2020 in [facility] Cardiac and Pulmonary Rehab  Referring Provider Claudette Stapler MD      Initial Encounter Date:    Pulmonary Rehab from 04/29/2020 in [facility] Cardiac and Pulmonary Rehab  Date 04/29/20      Visit Diagnosis: Severe persistent asthma, unspecified whether complicated  Patient's Home Medications on Admission:  Current Outpatient Medications:  .  acetaminophen (TYLENOL) 500 MG tablet, Take 1-2 tablets (500-1,000 mg total) by mouth every 6 (six) hours as needed for mild pain (pain score 1-3 or temp > 100.5)., Disp: , Rfl:  .  albuterol (PROVENTIL HFA;VENTOLIN HFA) 108 (90 Base) MCG/ACT inhaler, Inhale 1-2 puffs into the lungs every 6 (six) hours as needed for wheezing or shortness of breath. , Disp: , Rfl:  .  budesonide (PULMICORT) 0.5 MG/2ML nebulizer solution, Take 0.5 mg by nebulization every 6 (six) hours as needed (shortness of breath or wheezing). , Disp: , Rfl:  .  budesonide (PULMICORT) 0.5 MG/2ML nebulizer solution, Inhale into the lungs., Disp: , Rfl:  .  cyanocobalamin (,VITAMIN B-12,) 1000 MCG/ML injection, Inject 1,000 mcg into the muscle every 30 (thirty) days., Disp: , Rfl:  .  docusate sodium (COLACE) 100 MG capsule, Take 1 capsule (100 mg total) by mouth 2 (two) times daily., Disp: 10 capsule, Rfl: 0 .  Dupilumab (DUPIXENT) 300 MG/2ML SOPN, Inject 300 mg into the skin every 14 (fourteen) days. JUST HAD DOSE TODAY 6-3 (Patient not taking: Reported on 04/29/2020), Disp: , Rfl:  .  enoxaparin (LOVENOX) 40 MG/0.4ML injection, Inject 0.4 mLs (40 mg total) into the skin daily for 14 days., Disp: 5.6 mL, Rfl: 0 .  esomeprazole (NEXIUM) 40 MG capsule, Take by mouth., Disp: , Rfl:  .  fluticasone (FLONASE) 50 MCG/ACT nasal spray, Place into the nose., Disp: , Rfl:  .  fluticasone (FLOVENT HFA) 220 MCG/ACT inhaler,  Inhale into the lungs., Disp: , Rfl:  .  gabapentin (NEURONTIN) 300 MG capsule, Take by mouth., Disp: , Rfl:  .  hydrochlorothiazide (HYDRODIURIL) 12.5 MG tablet, Take 12.5 mg by mouth daily. , Disp: , Rfl:  .  hydrochlorothiazide (HYDRODIURIL) 12.5 MG tablet, Take by mouth., Disp: , Rfl:  .  methocarbamol (ROBAXIN) 500 MG tablet, Take 1 tablet (500 mg total) by mouth every 6 (six) hours as needed for muscle spasms., Disp: 210 tablet, Rfl: 0 .  montelukast (SINGULAIR) 10 MG tablet, Take 1 tablet (10 mg total) by mouth at bedtime. appt further refills (Patient taking differently: Take 10 mg by mouth at bedtime. ), Disp: 90 tablet, Rfl: 1 .  montelukast (SINGULAIR) 10 MG tablet, Take by mouth., Disp: , Rfl:  .  omeprazole (PRILOSEC) 40 MG capsule, Take 40 mg by mouth at bedtime., Disp: , Rfl:  .  oxyCODONE (OXY IR/ROXICODONE) 5 MG immediate release tablet, Take 1-2 tablets (5-10 mg total) by mouth every 4 (four) hours as needed for moderate pain (pain score 4-6). (Patient not taking: Reported on 04/29/2020), Disp: 40 tablet, Rfl: 0 .  pramipexole (MIRAPEX) 1.5 MG tablet, Take 1.5 mg by mouth at bedtime. , Disp: , Rfl:  .  venlafaxine XR (EFFEXOR-XR) 150 MG 24 hr capsule, Take 1 capsule (150 mg total) by mouth daily with breakfast. (Patient not taking: Reported on 04/29/2020), Disp: 90 capsule, Rfl: 3 .  venlafaxine XR (EFFEXOR-XR) 75 MG 24 hr capsule, Take 225 mg  by mouth daily with breakfast., Disp: , Rfl:  No current facility-administered medications for this visit.  Facility-Administered Medications Ordered in Other Visits:  .  [COMPLETED] sodium chloride 0.9 % nebulizer solution 3 mL, 3 mL, Nebulization, Once, 3 mL at 10/03/18 1130 **FOLLOWED BY** [COMPLETED] methacholine (PROVOCHOLINE) inhaler solution 0.125 mg, 2 mL, Inhalation, Once, 0.125 mg at 10/03/18 1139 **FOLLOWED BY** methacholine (PROVOCHOLINE) inhaler solution 0.5 mg, 2 mL, Inhalation, Once **FOLLOWED BY** methacholine (PROVOCHOLINE)  inhaler solution 2 mg, 2 mL, Inhalation, Once **FOLLOWED BY** methacholine (PROVOCHOLINE) inhaler solution 8 mg, 2 mL, Inhalation, Once **FOLLOWED BY** methacholine (PROVOCHOLINE) inhaler solution 32 mg, 2 mL, Inhalation, Once **FOLLOWED BY** [COMPLETED] albuterol (PROVENTIL) (2.5 MG/3ML) 0.083% nebulizer solution 2.5 mg, 2.5 mg, Nebulization, Once, Ottie Glazier, MD, 2.5 mg at 10/03/18 1146  Past Medical History: Past Medical History:  Diagnosis Date  . Arthritis   . Asthma   . Chronic cough   . Colon polyps   . Depression   . GERD (gastroesophageal reflux disease)   . Hiatal hernia    LARGE  . History of chicken pox   . Hypertension   . Macular degeneration   . Multinodular goiter    FNA in past neg h/o thyroid cysts  . Pernicious anemia    PERNICIOUS   . RLS (restless legs syndrome)   . Sleep apnea    NO CPAP    Tobacco Use: Social History   Tobacco Use  Smoking Status Never Smoker  Smokeless Tobacco Never Used    Labs: Recent Review Scientist, physiological    Labs for ITP Cardiac and Pulmonary Rehab Latest Ref Rng & Units 10/12/2018   Hemoglobin A1c 4.6 - 6.5 % 5.6       Pulmonary Assessment Scores:  Pulmonary Assessment Scores    Row Name 04/29/20 0834         ADL UCSD   ADL Phase Entry     SOB Score total 29     Rest 0     Walk 0     Stairs 5     Bath 0     Dress 0     Shop 1       CAT Score   CAT Score 26       mMRC Score   mMRC Score 1            UCSD: Self-administered rating of dyspnea associated with activities of daily living (ADLs) 6-point scale (0 = "not at all" to 5 = "maximal or unable to do because of breathlessness")  Scoring Scores range from 0 to 120.  Minimally important difference is 5 units  CAT: CAT can identify the health impairment of COPD patients and is better correlated with disease progression.  CAT has a scoring range of zero to 40. The CAT score is classified into four groups of low (less than 10), medium (10 - 20), high  (21-30) and very high (31-40) based on the impact level of disease on health status. A CAT score over 10 suggests significant symptoms.  A worsening CAT score could be explained by an exacerbation, poor medication adherence, poor inhaler technique, or progression of COPD or comorbid conditions.  CAT MCID is 2 points  mMRC: mMRC (Modified Medical Research Council) Dyspnea Scale is used to assess the degree of baseline functional disability in patients of respiratory disease due to dyspnea. No minimal important difference is established. A decrease in score of 1 point or greater is considered a positive change.  Pulmonary Function Assessment:  Pulmonary Function Assessment - 04/29/20 0756      Breath   Bilateral Breath Sounds Clear    Shortness of Breath No;Limiting activity           Exercise Target Goals: Exercise Program Goal: Individual exercise prescription set using results from initial 6 min walk test and THRR while considering  patient's activity barriers and safety.   Exercise Prescription Goal: Initial exercise prescription builds to 30-45 minutes a day of aerobic activity, 2-3 days per week.  Home exercise guidelines will be given to patient during program as part of exercise prescription that the participant will acknowledge.  Education: Aerobic Exercise & Resistance Training: - Gives group verbal and written instruction on the various components of exercise. Focuses on aerobic and resistive training programs and the benefits of this training and how to safely progress through these programs..   Education: Exercise & Equipment Safety: - Individual verbal instruction and demonstration of equipment use and safety with use of the equipment.   Pulmonary Rehab from 05/22/2020 in [facility] Cardiac [facility] Pulmonary Rehab  Date 04/29/20  Educator [facility]  Instruction Review Code 1- Verbalizes Understanding      Education: Exercise Physiology & General Exercise Guidelines: - Group verbal  and written instruction with models to review the exercise physiology of the cardiovascular system and associated critical values. Provides general exercise guidelines with specific guidelines to those with heart or lung disease.    Education: Flexibility, Balance, Mind/Body Relaxation: Provides group verbal/written instruction on the benefits of flexibility and balance training, including mind/body exercise modes such as yoga, pilates and tai chi.  Demonstration and skill practice provided.   Activity Barriers & Risk Stratification:  Activity Barriers & Cardiac Risk Stratification - 04/29/20 0757      Activity Barriers & Cardiac Risk Stratification   Activity Barriers Deconditioning;Joint Problems;Other (comment);Muscular Weakness;Arthritis;Shortness of Breath;Right Knee Replacement;Balance Concerns    Comments TKR completed July 2021           6 Minute Walk:  6 Minute Walk    Row Name 04/29/20 0903         6 Minute Walk   Phase Initial     Distance 1320 feet     Walk Time 6 minutes     # of Rest Breaks 0     MPH 2.5     METS 2.34     RPE 13     Perceived Dyspnea  2     VO2 Peak 8.2     Symptoms Yes (comment)     Comments SOB     Resting HR 76 bpm     Resting BP 134/74     Resting Oxygen Saturation  95 %     Exercise Oxygen Saturation  during 6 min walk 89 %     Max Ex. HR 108 bpm     Max Ex. BP 144/66     2 Minute Post BP 136/70       Interval HR   1 Minute HR 100     2 Minute HR 98     3 Minute HR 100     4 Minute HR 106     5 Minute HR 101     6 Minute HR 108     2 Minute Post HR 86     Interval Heart Rate? Yes       Interval Oxygen   Interval Oxygen? Yes     Baseline Oxygen Saturation % 95 %  1 Minute Oxygen Saturation % 91 %     1 Minute Liters of Oxygen 0 L  Room Air     2 Minute Oxygen Saturation % 89 %     2 Minute Liters of Oxygen 0 L     3 Minute Oxygen Saturation % 92 %     3 Minute Liters of Oxygen 0 L     4 Minute Oxygen Saturation % 93  %     4 Minute Liters of Oxygen 0 L     5 Minute Oxygen Saturation % 94 %     5 Minute Liters of Oxygen 0 L     6 Minute Oxygen Saturation % 94 %     6 Minute Liters of Oxygen 0 L     2 Minute Post Oxygen Saturation % 96 %     2 Minute Post Liters of Oxygen 0 L           Oxygen Initial Assessment:  Oxygen Initial Assessment - 04/29/20 0756      Home Oxygen   Home Oxygen Device None    Sleep Oxygen Prescription None    Home Exercise Oxygen Prescription None    Home Resting Oxygen Prescription None      Initial 6 min Walk   Oxygen Used None      Program Oxygen Prescription   Program Oxygen Prescription None      Intervention   Short Term Goals To learn and understand importance of monitoring SPO2 with pulse oximeter and demonstrate accurate use of the pulse oximeter.;To learn and understand importance of maintaining oxygen saturations>88%;To learn and demonstrate proper pursed lip breathing techniques or other breathing techniques.;To learn and demonstrate proper use of respiratory medications    Long  Term Goals Verbalizes importance of monitoring SPO2 with pulse oximeter and return demonstration;Maintenance of O2 saturations>88%;Exhibits proper breathing techniques, such as pursed lip breathing or other method taught during program session;Compliance with respiratory medication;Demonstrates proper use of MDI's           Oxygen Re-Evaluation:  Oxygen Re-Evaluation    Row Name 05/13/20 0802             Program Oxygen Prescription   Program Oxygen Prescription None         Home Oxygen   Home Oxygen Device None       Sleep Oxygen Prescription None       Home Exercise Oxygen Prescription None       Home Resting Oxygen Prescription None         Goals/Expected Outcomes   Short Term Goals To learn and understand importance of monitoring SPO2 with pulse oximeter and demonstrate accurate use of the pulse oximeter.;To learn and understand importance of maintaining oxygen  saturations>88%;To learn and demonstrate proper pursed lip breathing techniques or other breathing techniques.;To learn and demonstrate proper use of respiratory medications       Long  Term Goals Verbalizes importance of monitoring SPO2 with pulse oximeter and return demonstration;Maintenance of O2 saturations>88%;Exhibits proper breathing techniques, such as pursed lip breathing or other method taught during program session;Compliance with respiratory medication;Demonstrates proper use of MDI's       Comments Sharon Cervantes is struggling with her breathing.  She is supposed to use her nebulizer twice a day but only getting once a day.  She is trying to use her pursed lip breathing some.  She coughing more and needs her inhaler more often.  She is going to try to use  her nebulizer the twice a day. Her sats have been good.       Goals/Expected Outcomes Short: Nebulizer twice a day Long: Conitnue to work on improving breathing.              Oxygen Discharge (Final Oxygen Re-Evaluation):  Oxygen Re-Evaluation - 05/13/20 0802      Program Oxygen Prescription   Program Oxygen Prescription None      Home Oxygen   Home Oxygen Device None    Sleep Oxygen Prescription None    Home Exercise Oxygen Prescription None    Home Resting Oxygen Prescription None      Goals/Expected Outcomes   Short Term Goals To learn and understand importance of monitoring SPO2 with pulse oximeter and demonstrate accurate use of the pulse oximeter.;To learn and understand importance of maintaining oxygen saturations>88%;To learn and demonstrate proper pursed lip breathing techniques or other breathing techniques.;To learn and demonstrate proper use of respiratory medications    Long  Term Goals Verbalizes importance of monitoring SPO2 with pulse oximeter and return demonstration;Maintenance of O2 saturations>88%;Exhibits proper breathing techniques, such as pursed lip breathing or other method taught during program  session;Compliance with respiratory medication;Demonstrates proper use of MDI's    Comments Sharon Cervantes is struggling with her breathing.  She is supposed to use her nebulizer twice a day but only getting once a day.  She is trying to use her pursed lip breathing some.  She coughing more and needs her inhaler more often.  She is going to try to use her nebulizer the twice a day. Her sats have been good.    Goals/Expected Outcomes Short: Nebulizer twice a day Long: Conitnue to work on improving breathing.           Initial Exercise Prescription:  Initial Exercise Prescription - 04/29/20 0900      Date of Initial Exercise RX and Referring Provider   Date 04/29/20    Referring Provider Claudette Stapler MD      Treadmill   MPH 2    Grade 0.5    Minutes 15    METs 2.67      REL-XR   Level 1    Speed 50    Minutes 15    METs 2.6      T5 Nustep   Level 2    SPM 80    Minutes 15    METs 2.6      Prescription Details   Frequency (times per week) 2    Duration Progress to 30 minutes of continuous aerobic without signs/symptoms of physical distress      Intensity   THRR 40-80% of Max Heartrate 106-135    Ratings of Perceived Exertion 11-13    Perceived Dyspnea 0-4      Progression   Progression Continue to progress workloads to maintain intensity without signs/symptoms of physical distress.      Resistance Training   Training Prescription Yes    Weight 3 lb    Reps 10-15           Perform Capillary Blood Glucose checks as needed.  Exercise Prescription Changes:  Exercise Prescription Changes    Row Name 04/29/20 0900 05/13/20 1500 05/26/20 1800         Response to Exercise   Blood Pressure (Admit) 134/74 120/72 122/66     Blood Pressure (Exercise) 144/66 136/74 132/78     Blood Pressure (Exit) 128/60 124/70 122/74     Heart Rate (Admit) 76 bpm 74  bpm 88 bpm     Heart Rate (Exercise) 108 bpm 89 bpm 97 bpm     Heart Rate (Exit) 86 bpm 85 bpm 90 bpm     Oxygen  Saturation (Admit) 95 % 97 % 94 %     Oxygen Saturation (Exercise) 89 % 94 % 96 %     Oxygen Saturation (Exit) 94 % 95 % 95 %     Rating of Perceived Exertion (Exercise) _0 Perceived Dyspnea (Exercise) _1 Symptoms SOB SOB --     Comments walk test results -- --     Duration -- Continue with 30 min of aerobic exercise without signs/symptoms of physical distress. Continue with 30 min of aerobic exercise without signs/symptoms of physical distress.     Intensity -- THRR unchanged THRR unchanged       Progression   Progression -- Continue to progress workloads to maintain intensity without signs/symptoms of physical distress. Continue to progress workloads to maintain intensity without signs/symptoms of physical distress.     Average METs -- 2.38 2.3       Resistance Training   Training Prescription -- Yes Yes     Weight -- 3 lb  3lb     Reps -- 10-15 10-15       Interval Training   Interval Training -- No No       Treadmill   MPH -- 2.2 --     Grade -- 0.5 --     Minutes -- 15 --     METs -- 2.84 --       REL-XR   Level -- 1 3     Speed -- -- 50     Minutes -- 15 15     METs -- 2 --       T5 Nustep   Level -- 5 3     SPM -- -- 80     Minutes -- 15 15     METs -- 2.3 2.3       Home Exercise Plan   Plans to continue exercise at -- Home (comment)  walking, gym --     Frequency -- Add 2 additional days to program exercise sessions. --     Initial Home Exercises Provided -- 05/13/20 --            Exercise Comments:  Exercise Comments    Row Name 05/22/20 0747           Exercise Comments Sharon Cervantes saw the doctor yesterday and they started her on prednisone for her knee.  She is also supposed to stay off the treadmill for 3 weeks as well.  We also talked about making sure she continues to exercise to stay out of her depression.              Exercise Goals and Review:  Exercise Goals    Row Name 04/29/20 0909             Exercise Goals    Increase Physical Activity Yes       Intervention Provide advice, education, support and counseling about physical activity/exercise needs.;Develop an individualized exercise prescription for aerobic and resistive training based on initial evaluation findings, risk stratification, comorbidities and participant's personal goals.       Expected Outcomes Short Term: Attend rehab on a regular basis to increase amount of physical activity.;Long Term: Add in home exercise to make exercise part  of routine and to increase amount of physical activity.;Long Term: Exercising regularly at least 3-5 days a week.       Increase Strength and Stamina Yes       Intervention Provide advice, education, support and counseling about physical activity/exercise needs.;Develop an individualized exercise prescription for aerobic and resistive training based on initial evaluation findings, risk stratification, comorbidities and participant's personal goals.       Expected Outcomes Short Term: Increase workloads from initial exercise prescription for resistance, speed, and METs.;Short Term: Perform resistance training exercises routinely during rehab and add in resistance training at home;Long Term: Improve cardiorespiratory fitness, muscular endurance and strength as measured by increased METs and functional capacity (6MWT)       Able to understand and use rate of perceived exertion (RPE) scale Yes       Intervention Provide education and explanation on how to use RPE scale       Expected Outcomes Short Term: Able to use RPE daily in rehab to express subjective intensity level;Long Term:  Able to use RPE to guide intensity level when exercising independently       Able to understand and use Dyspnea scale Yes       Intervention Provide education and explanation on how to use Dyspnea scale       Expected Outcomes Short Term: Able to use Dyspnea scale daily in rehab to express subjective sense of shortness of breath during  exertion;Long Term: Able to use Dyspnea scale to guide intensity level when exercising independently       Knowledge and understanding of Target Heart Rate Range (THRR) Yes       Intervention Provide education and explanation of THRR including how the numbers were predicted and where they are located for reference       Expected Outcomes Short Term: Able to state/look up THRR;Long Term: Able to use THRR to govern intensity when exercising independently;Short Term: Able to use daily as guideline for intensity in rehab       Able to check pulse independently Yes       Intervention Provide education and demonstration on how to check pulse in carotid and radial arteries.;Review the importance of being able to check your own pulse for safety during independent exercise       Expected Outcomes Short Term: Able to explain why pulse checking is important during independent exercise;Long Term: Able to check pulse independently and accurately       Understanding of Exercise Prescription Yes       Intervention Provide education, explanation, and written materials on patient's individual exercise prescription       Expected Outcomes Short Term: Able to explain program exercise prescription;Long Term: Able to explain home exercise prescription to exercise independently              Exercise Goals Re-Evaluation :  Exercise Goals Re-Evaluation    Row Name 05/01/20 0755 05/13/20 0805 05/26/20 1804         Exercise Goal Re-Evaluation   Exercise Goals Review Increase Physical Activity;Able to understand and use rate of perceived exertion (RPE) scale;Knowledge and understanding of Target Heart Rate Range (THRR);Understanding of Exercise Prescription;Able to check pulse independently;Increase Strength and Stamina Increase Physical Activity;Able to understand and use rate of perceived exertion (RPE) scale;Knowledge and understanding of Target Heart Rate Range (THRR);Understanding of Exercise Prescription;Able to  check pulse independently;Increase Strength and Stamina;Able to understand and use Dyspnea scale Increase Physical Activity;Able to understand and use rate of  perceived exertion (RPE) scale;Knowledge and understanding of Target Heart Rate Range (THRR);Understanding of Exercise Prescription;Able to check pulse independently;Increase Strength and Stamina;Able to understand and use Dyspnea scale     Comments Reviewed RPE and dyspnea scales, THR and program prescription with pt today.  Pt voiced understanding and was given a copy of goals to take home. Reviewed home exercise with pt today.  Pt plans to walk at home for exercise.  Reviewed THR, pulse, RPE, sign and symptoms, pulse oximetery and when to call 911 or MD.  Also discussed weather considerations and indoor options.  Pt voiced understanding.  Sharon Cervantes is going to get back to her exercise routine. Sharon Cervantes has a bruised meniscus and her Dr recommended her not do the TM for now.  Staff will monitor progress.     Expected Outcomes Short: Use RPE daily to regulate intensity. Long: Follow program prescription in THR. Short: Get back to exericse routine.  Long: Continue to improve stamina. Short:  maintain consistent exercise Long: get back to walking            Discharge Exercise Prescription (Final Exercise Prescription Changes):  Exercise Prescription Changes - 05/26/20 1800      Response to Exercise   Blood Pressure (Admit) 122/66    Blood Pressure (Exercise) 132/78    Blood Pressure (Exit) 122/74    Heart Rate (Admit) 88 bpm    Heart Rate (Exercise) 97 bpm    Heart Rate (Exit) 90 bpm    Oxygen Saturation (Admit) 94 %    Oxygen Saturation (Exercise) 96 %    Oxygen Saturation (Exit) 95 %    Rating of Perceived Exertion (Exercise) 14    Perceived Dyspnea (Exercise) 1    Duration Continue with 30 min of aerobic exercise without signs/symptoms of physical distress.    Intensity THRR unchanged      Progression   Progression Continue to progress  workloads to maintain intensity without signs/symptoms of physical distress.    Average METs 2.3      Resistance Training   Training Prescription Yes    Weight  3lb    Reps 10-15      Interval Training   Interval Training No      REL-XR   Level 3    Speed 50    Minutes 15      T5 Nustep   Level 3    SPM 80    Minutes 15    METs 2.3           Nutrition:  Target Goals: Understanding of nutrition guidelines, daily intake of sodium <1579m, cholesterol <2086m calories 30% from fat and 7% or less from saturated fats, daily to have 5 or more servings of fruits and vegetables.  Education: Controlling Sodium/Reading Food Labels -Group verbal and written material supporting the discussion of sodium use in heart healthy nutrition. Review and explanation with models, verbal and written materials for utilization of the food label.   Education: General Nutrition Guidelines/Fats and Fiber: -Group instruction provided by verbal, written material, models and posters to present the general guidelines for heart healthy nutrition. Gives an explanation and review of dietary fats and fiber.   [facility]  Date 05/08/20  [facility] 1- Verbalizes Understanding      Biometrics:  Pre Biometrics - 04/29/20 0909      Pre Biometrics   Height 5' 5.7" (1.669 m)    Weight 251  lb 1.6 oz (113.9 kg)    BMI (Calculated) 40.89    Single Leg Stand 6.47 seconds            Nutrition Therapy Plan and Nutrition Goals:   Nutrition Assessments:  Nutrition Assessments - 04/29/20 0851      MEDFICTS Scores   Pre Score 42           MEDIFICTS Score Key:          ?70 Need to make dietary changes          40-70 Heart Healthy Diet         ? 40 Therapeutic Level Cholesterol Diet  Nutrition Goals Re-Evaluation:   Nutrition Goals Discharge (Final Nutrition Goals Re-Evaluation):   Psychosocial: Target Goals:  Acknowledge presence or absence of significant depression and/or stress, maximize coping skills, provide positive support system. Participant is able to verbalize types and ability to use techniques and skills needed for reducing stress and depression.   Education: Depression - Provides group verbal and written instruction on the correlation between heart/lung disease and depressed mood, treatment options, and the stigmas associated with seeking treatment.   Education: Sleep Hygiene -Provides group verbal and written instruction about how sleep can affect your health.  Define sleep hygiene, discuss sleep cycles and impact of sleep habits. Review good sleep hygiene tips.    Education: Stress and Anxiety: - Provides group verbal and written instruction about the health risks of elevated stress and causes of high stress.  Discuss the correlation between heart/lung disease and anxiety and treatment options. Review healthy ways to manage with stress and anxiety.   Initial Review & Psychosocial Screening:  Initial Psych Review & Screening - 04/29/20 0758      Initial Review   Current issues with Current Stress Concerns;History of Depression;Current Depression;Current Sleep Concerns    Source of Stress Concerns Chronic Illness;Financial    Comments PHQ is 5, no feeling of depression currently, not sleeping great 4-5 hours a night (going to bed late and up early), finances have gotten better      [place]? Yes   Sharla Kidney lives near by, [place] friend April (moving this weekend)   Concerns Inappropriate over/under dependence on family/friends      Barriers   Psychosocial barriers to participate in program The patient should benefit from training in stress management and relaxation.;Psychosocial barriers identified (see note)      Screening Interventions   Interventions Provide feedback about the scores to participant;To provide support and resources with identified  psychosocial needs;Encouraged to exercise    Expected Outcomes Short Term goal: Utilizing psychosocial counselor, staff and physician to assist with identification of specific Stressors or current issues interfering with healing process. Setting desired goal for each stressor or current issue identified.;Long Term Goal: Stressors or current issues are controlled or eliminated.;Short Term goal: Identification and review with participant of any Quality of Life or Depression concerns found by scoring the questionnaire.;Long Term goal: The participant improves quality of Life and PHQ9 Scores as seen by post scores and/or verbalization of changes           Quality of Life Scores:  Scores of 19 and below usually indicate a poorer quality of life in these areas.  A difference of  2-3 points is a clinically meaningful difference.  A difference of 2-3 points in the total score of the Quality of Life Index has been associated with significant improvement in overall quality of  life, self-image, physical symptoms, and general health in studies assessing change in quality of life.  PHQ-9: Recent Review Flowsheet Data    Depression screen [facility] 2/9 05/13/2020 04/29/2020 12/24/2019 12/10/2019 10/29/2019   Decreased Interest 0 0 0 2 0   Down, Depressed, Hopeless 0 0 0 3 0   PHQ - 2 Score 0 0 0 5 0   Altered sleeping _0 0   Tired, decreased energy _1 0   Change in appetite 1 0 0 0 0   Feeling bad or failure about yourself  0 0 0 1 0   Trouble concentrating _2 0   Moving slowly or fidgety/restless 0 0 0 0 0   Suicidal thoughts 0 0 0 0 0   PHQ-9 Score _3 0   Difficult doing work/chores Somewhat difficult Somewhat difficult Not difficult at all Somewhat difficult Not difficult at all     Interpretation of Total Score  Total Score Depression Severity:  1-4 = Minimal depression, 5-9 = Mild depression, 10-14 = Moderate depression, 15-19 = Moderately severe depression, 20-27 = Severe depression    Psychosocial Evaluation and Intervention:  Psychosocial Evaluation - 04/29/20 0800      Psychosocial Evaluation & Interventions   Interventions Stress management education;Encouraged to exercise with the program and follow exercise prescription    Comments Sharon Cervantes is coming back to pulmonary rehab with her asthma that has gotten worse since surgery.  She wants to focus on getting on her nebulizer and get back to walking longer.  She has a few new friends, but one is moving away this weekend.  She wants to get her weight back down too. Her finances are doing better.  Sleep continues to struggle for her.    Expected Outcomes Short: Attend rehab to improve breathing and endurance  Long: Continue to focus on positive and use mental boost    Continue Psychosocial Services  Follow up required by staff           Psychosocial Re-Evaluation:  Psychosocial Re-Evaluation    [place] Name 05/13/20 0757             Psychosocial Re-Evaluation   Current issues with Current Stress Concerns;Current Sleep Concerns       Comments Sharon Cervantes is doing well in rehab.  She is sleeping pretty good, but still is staying up late.  She is trying to get better.  Her cat is doing better to with sleep.  She is going to get started on her home exercise again.  Her PHQ is the same, but shifting around some.       Expected Outcomes Short: Get back to exercise routine for mood boost  Long: Continue to focus on positive.       Interventions Encouraged to attend Pulmonary Rehabilitation for the exercise       Continue Psychosocial Services  Follow up required by staff              Psychosocial Discharge (Final Psychosocial Re-Evaluation):  Psychosocial Re-Evaluation - 05/13/20 0757      Psychosocial Re-Evaluation   Current issues with Current Stress Concerns;Current Sleep Concerns    Comments Sharon Cervantes is doing well in rehab.  She is sleeping pretty good, but still is staying up late.  She is trying to get better.  Her cat  is doing better to with sleep.  She is going to get started on her home exercise again.  Her  PHQ is the same, but shifting around some.    Expected Outcomes Short: Get back to exercise routine for mood boost  Long: Continue to focus on positive.    Interventions Encouraged to attend Pulmonary Rehabilitation for the exercise    Continue Psychosocial Services  Follow up required by staff           Education: Education Goals: Education classes will be provided on a weekly basis, covering required topics. Participant will state understanding/return demonstration of topics presented.  Learning Barriers/Preferences:  Learning Barriers/Preferences - 04/29/20 0803      Learning Barriers/Preferences   Learning Barriers None    Learning Preferences None           General Pulmonary Education Topics:  Infection Prevention: - Provides verbal and written material to individual with discussion of infection control including proper hand washing and proper equipment cleaning during exercise session.   Pulmonary Rehab from 05/22/2020 in [facility] Card[facility] and Pulmonary Rehab  Date 04/29/20  Educator [facility]  Instruction Review Code 1- Verbalizes Understanding      Falls Prevention: - Provides verbal and written material to individual with discussion of falls prevention and safety.   Pulmonary Rehab from 05/22/2020 in [facility] Cardiac and Pulmonary Rehab  Date 04/29/20  Educator [facility]  Instruction Review Code 1- Verbalizes Understanding      Chronic Lung Diseases: - Group verbal and written instruction to review updates, respiratory medications, advancements in procedures and treatments. Discuss use of supplemental oxygen including available portable oxygen systems, continuous and intermittent flow rates, concentrators, personal use and safety guidelines. Review proper use of inhaler and spacers. Provide informative websites for self-education.    Pulmonary Rehab from 05/22/2020 in [facility] Cardiac and Pulmonary  Rehab  Date 04/29/20  Instruction Review Code 3- Needs Reinforcement  [need identified]      Energy Conservation: - Provide group verbal and written instruction for methods to conserve energy, plan and organize activities. Instruct on pacing techniques, use of adaptive equipment and posture/positioning to relieve shortness of breath.   Triggers and Exacerbations: - Group verbal and written instruction to review types of environmental triggers and ways to prevent exacerbations. Discuss weather changes, air quality and the benefits of nasal washing. Review warning signs and symptoms to help prevent infections. Discuss techniques for effective airway clearance, coughing, and vibrations.   AED/CPR: - Group verbal and written instruction with the use of models to demonstrate the basic use of the AED with the basic ABC's of resuscitation.   Anatomy and Physiology of the Lungs: - Group verbal and written instruction with the use of models to provide basic lung anatomy and physiology related to function, structure and complications of lung disease.   Anatomy & Physiology of the Heart: - Group verbal and written instruction and models provide basic cardiac anatomy and physiology, with the coronary electrical and arterial systems. Review of Valvular disease and Heart Failure   Cardiac Medications: - Group verbal and written instruction to review commonly prescribed medications for heart disease. Reviews the medication, class of the drug, and side effects.   Other: -Provides group and verbal instruction on various topics (see comments)   Knowledge Questionnaire Score:  Knowledge Questionnaire Score - 04/29/20 0851      Knowledge Questionnaire Score   Pre Score 16/18 Education Focus: O2 safety            Core Components/Risk Factors/Patient Goals at Admission:  Personal Goals and Risk Factors at Admission - 04/29/20 9292  Core Components/Risk Factors/Patient Goals on Admission     Weight Management Yes;Obesity;Weight Loss    Intervention Weight Management: Develop a combined nutrition and exercise program designed to reach desired caloric intake, while maintaining appropriate intake of nutrient and fiber, sodium and fats, and appropriate energy expenditure required for the weight goal.;Weight Management/Obesity: Establish reasonable short term and long term weight goals.;Weight Management: Provide education and appropriate resources to help participant work on and attain dietary goals.;Obesity: Provide education and appropriate resources to help participant work on and attain dietary goals.    Admit Weight 251 lb 1.6 oz (113.9 kg)    Goal Weight: Short Term 245 lb (111.1 kg)    Goal Weight: Long Term 240 lb (108.9 kg)    Expected Outcomes Short Term: Continue to assess and modify interventions until short term weight is achieved;Long Term: Adherence to nutrition and physical activity/exercise program aimed toward attainment of established weight goal;Weight Loss: Understanding of general recommendations for a balanced deficit meal plan, which promotes 1-2 lb weight loss per week and includes a negative energy balance of [phone] kcal/d;Understanding recommendations for meals to include 15-35% energy as protein, 25-35% energy from fat, 35-60% energy from carbohydrates, less than 248m of dietary cholesterol, 20-35 gm of total fiber daily;Understanding of distribution of calorie intake throughout the day with the consumption of 4-5 meals/snacks    Improve shortness of breath with ADL's Yes    Intervention Provide education, individualized exercise plan and daily activity instruction to help decrease symptoms of SOB with activities of daily living.    Expected Outcomes Short Term: Improve cardiorespiratory fitness to achieve a reduction of symptoms when performing ADLs;Long Term: Be able to perform more ADLs without symptoms or delay the onset of symptoms    Hypertension Yes     Intervention Provide education on lifestyle modifcations including regular physical activity/exercise, weight management, moderate sodium restriction and increased consumption of fresh fruit, vegetables, and low fat dairy, alcohol moderation, and smoking cessation.;Monitor prescription use compliance.    Expected Outcomes Long Term: Maintenance of blood pressure at goal levels.;Short Term: Continued assessment and intervention until BP is < 140/947mHG in hypertensive participants. < 130/8010mG in hypertensive participants with diabetes, heart failure or chronic kidney disease.    Lipids Yes    Intervention Provide education and support for participant on nutrition & aerobic/resistive exercise along with prescribed medications to achieve LDL <20m62mDL >40mg2m Expected Outcomes Short Term: Participant states understanding of desired cholesterol values and is compliant with medications prescribed. Participant is following exercise prescription and nutrition guidelines.;Long Term: Cholesterol controlled with medications as prescribed, with individualized exercise RX and with personalized nutrition plan. Value goals: LDL < 20mg,25m > 40 mg.           Education:Diabetes - Individual verbal and written instruction to review signs/symptoms of diabetes, desired ranges of glucose level fasting, after meals and with exercise. Acknowledge that pre and post exercise glucose checks will be done for 3 sessions at entry of program.   Education: Know Your Numbers and Risk Factors: -Group verbal and written instruction about important numbers in your health.  Discussion of what are risk factors and how they play a role in the disease process.  Review of Cholesterol, Blood Pressure, Diabetes, and BMI and the role they play in your overall health.   [facility]  Date 05/22/20  Educator [facility]  Instruction Review Code 1- Verbalizes Understanding  Core  Components/Risk Factors/Patient Goals Review:   Goals and Risk Factor Review    Row Name 05/13/20 0800             Core Components/Risk Factors/Patient Goals Review   Personal Goals Review Weight Management/Obesity;Improve shortness of breath with ADL's;Hypertension;Lipids       Review Sharon Cervantes is off to a good start in rehab.  Her weight was up today as she was eating over the weekend.  She is determined to get back to exercise to help curb her eating.  Blood pressures have been doing good in class but she is not checking at home since they have been doing better.  Her breathing is still a bit of a stuggle recently. She is trying to get her neublizer im       Expected Outcomes Short: Get back to nebulizer  Long: Work on weight loss.              Core Components/Risk Factors/Patient Goals at Discharge (Final Review):   Goals and Risk Factor Review - 05/13/20 0800      Core Components/Risk Factors/Patient Goals Review   Personal Goals Review Weight Management/Obesity;Improve shortness of breath with ADL's;Hypertension;Lipids    Review Sharon Cervantes is off to a good start in rehab.  Her weight was up today as she was eating over the weekend.  She is determined to get back to exercise to help curb her eating.  Blood pressures have been doing good in class but she is not checking at home since they have been doing better.  Her breathing is still a bit of a stuggle recently. She is trying to get her neublizer im    Expected Outcomes Short: Get back to nebulizer  Long: Work on weight loss.           ITP Comments:  ITP Comments    Row Name 04/29/20 0805 05/01/20 0755 05/06/20 0717 05/07/20 1548 05/22/20 0746   ITP Comments Completed 6MWT and gym orientation. Initial ITP created and sent for review to Dr. Emily Filbert, Medical Director. Documentaiton for diagnosis can be found in Care Everywhere encounter 04/10/2020. First full day of exercise!  Patient was oriented to gym and equipment including  functions, settings, policies, and procedures.  Patient's individual exercise prescription and treatment plan were reviewed.  All starting workloads were established based on the results of the 6 minute walk test done at initial orientation visit.  The plan for exercise progression was also introduced and progression will be customized based on patient's performance and goals pt fell and hurt knee at home yesterday 30 day review completed. ITP sent to Dr. Emily Filbert, Medical Director of Cardiac and Pulmonary Rehab. Continue with ITP unless changes are made by physician. [place] saw the doctor yesterday and they started her on prednisone for her knee.  She is also supposed to stay off the treadmill for 3 weeks as well.   [place] Name 06/04/20 0548           ITP Comments 30 Day review completed. Medical Director ITP review done, changes made as directed, and signed approval by Medical Director.              Comments:

## 2020-06-10 ENCOUNTER — Encounter: Payer: Self-pay | Admitting: *Deleted

## 2020-06-10 DIAGNOSIS — J4550 Severe persistent asthma, uncomplicated: Secondary | ICD-10-CM

## 2020-06-17 ENCOUNTER — Telehealth: Payer: Self-pay | Admitting: *Deleted

## 2020-06-17 ENCOUNTER — Encounter: Payer: Self-pay | Admitting: *Deleted

## 2020-06-17 DIAGNOSIS — J4550 Severe persistent asthma, uncomplicated: Secondary | ICD-10-CM

## 2020-06-17 NOTE — Telephone Encounter (Signed)
Sharon Cervantes called and said she will not be here for the next two weeks.  She also left message that her physician has started her with PT for her knee.

## 2020-06-17 NOTE — Progress Notes (Signed)
Sharon Cervantes called and said she will not be here for the next two weeks.  She also left message that her physician has started her with PT for her knee.  

## 2020-06-30 NOTE — Progress Notes (Signed)
unable to meet with pt yet due to scheduling and pt attendance

## 2020-07-01 ENCOUNTER — Encounter: Payer: Medicare Other | Attending: Pulmonary Disease | Admitting: *Deleted

## 2020-07-01 ENCOUNTER — Other Ambulatory Visit: Payer: Self-pay

## 2020-07-01 DIAGNOSIS — I272 Pulmonary hypertension, unspecified: Secondary | ICD-10-CM | POA: Insufficient documentation

## 2020-07-01 DIAGNOSIS — J4550 Severe persistent asthma, uncomplicated: Secondary | ICD-10-CM | POA: Diagnosis present

## 2020-07-01 NOTE — Progress Notes (Signed)
Daily Session Note  Patient Details  Name: Sharon Cervantes MRN: [id number] Date of Birth: April 01, 1951 Referring Provider:     Pulmonary Rehab from 04/29/2020 in [facility] Cardiac and Pulmonary Rehab  Referring Provider Claudette Stapler MD      Encounter Date: 07/01/2020  Check In:  Session Check In - 07/01/20 0930      Check-In   Supervising physician immediately available to respond to emergencies See telemetry face sheet for immediately available ER MD    Location [facility]-Cardiac & Pulmonary Rehab    Staff Present Heath Lark, RN, BSN, CCRP;Joseph Hood RCP,RRT,BSRT;Amanda [place], [place], ACSM CEP, Exercise Physiologist    Virtual Visit No    Medication changes reported     No    Fall or balance concerns reported    No    Warm-up and Cool-down Performed on first and last piece of equipment    Resistance Training Performed No    VAD Patient? No    PAD/SET Patient? No      Pain Assessment   Currently in Pain? No/denies              Social History   Tobacco Use  Smoking Status Never Smoker  Smokeless Tobacco Never Used    Goals Met:  Proper associated with RPD/PD & O2 Sat [place] with exercise equipment Exercise tolerated well No report of cardiac concerns or symptoms  Goals Unmet:  Not Applicable  Comments: Pt able to follow exercise prescription today without complaint.  Will continue to monitor for progression.    Dr. Emily Filbert is Medical Director for [place] and [name].

## 2020-07-02 ENCOUNTER — Encounter: Payer: Self-pay | Admitting: *Deleted

## 2020-07-02 DIAGNOSIS — J4550 Severe persistent asthma, uncomplicated: Secondary | ICD-10-CM

## 2020-07-02 NOTE — Progress Notes (Signed)
Pulmonary Individual Treatment Plan  Patient Details  Name: Sharon Cervantes MRN: [id number] Date of Birth: Oct 21, 1950 Referring Provider:     Pulmonary Rehab from 04/29/2020 in [facility] Cardiac and Pulmonary Rehab  Referring Provider Claudette Stapler MD      Initial Encounter Date:    Pulmonary Rehab from 04/29/2020 in [facility] Cardiac and Pulmonary Rehab  Date 04/29/20      Visit Diagnosis: Severe persistent asthma, unspecified whether complicated  Patient's Home Medications on Admission:  Current Outpatient Medications:  .  acetaminophen (TYLENOL) 500 MG tablet, Take 1-2 tablets (500-1,000 mg total) by mouth every 6 (six) hours as needed for mild pain (pain score 1-3 or temp > 100.5)., Disp: , Rfl:  .  albuterol (PROVENTIL HFA;VENTOLIN HFA) 108 (90 Base) MCG/ACT inhaler, Inhale 1-2 puffs into the lungs every 6 (six) hours as needed for wheezing or shortness of breath. , Disp: , Rfl:  .  budesonide (PULMICORT) 0.5 MG/2ML nebulizer solution, Take 0.5 mg by nebulization every 6 (six) hours as needed (shortness of breath or wheezing). , Disp: , Rfl:  .  budesonide (PULMICORT) 0.5 MG/2ML nebulizer solution, Inhale into the lungs., Disp: , Rfl:  .  cyanocobalamin (,VITAMIN B-12,) 1000 MCG/ML injection, Inject 1,000 mcg into the muscle every 30 (thirty) days., Disp: , Rfl:  .  docusate sodium (COLACE) 100 MG capsule, Take 1 capsule (100 mg total) by mouth 2 (two) times daily., Disp: 10 capsule, Rfl: 0 .  Dupilumab (DUPIXENT) 300 MG/2ML SOPN, Inject 300 mg into the skin every 14 (fourteen) days. JUST HAD DOSE TODAY 6-3 (Patient not taking: Reported on 04/29/2020), Disp: , Rfl:  .  enoxaparin (LOVENOX) 40 MG/0.4ML injection, Inject 0.4 mLs (40 mg total) into the skin daily for 14 days., Disp: 5.6 mL, Rfl: 0 .  esomeprazole (NEXIUM) 40 MG capsule, Take by mouth., Disp: , Rfl:  .  fluticasone (FLONASE) 50 MCG/ACT nasal spray, Place into the nose., Disp: , Rfl:  .  fluticasone (FLOVENT HFA) 220 MCG/ACT inhaler,  Inhale into the lungs., Disp: , Rfl:  .  gabapentin (NEURONTIN) 300 MG capsule, Take by mouth., Disp: , Rfl:  .  hydrochlorothiazide (HYDRODIURIL) 12.5 MG tablet, Take 12.5 mg by mouth daily. , Disp: , Rfl:  .  hydrochlorothiazide (HYDRODIURIL) 12.5 MG tablet, Take by mouth., Disp: , Rfl:  .  methocarbamol (ROBAXIN) 500 MG tablet, Take 1 tablet (500 mg total) by mouth every 6 (six) hours as needed for muscle spasms., Disp: 210 tablet, Rfl: 0 .  montelukast (SINGULAIR) 10 MG tablet, Take 1 tablet (10 mg total) by mouth at bedtime. appt further refills (Patient taking differently: Take 10 mg by mouth at bedtime. ), Disp: 90 tablet, Rfl: 1 .  montelukast (SINGULAIR) 10 MG tablet, Take by mouth., Disp: , Rfl:  .  omeprazole (PRILOSEC) 40 MG capsule, Take 40 mg by mouth at bedtime., Disp: , Rfl:  .  oxyCODONE (OXY IR/ROXICODONE) 5 MG immediate release tablet, Take 1-2 tablets (5-10 mg total) by mouth every 4 (four) hours as needed for moderate pain (pain score 4-6). (Patient not taking: Reported on 04/29/2020), Disp: 40 tablet, Rfl: 0 .  pramipexole (MIRAPEX) 1.5 MG tablet, Take 1.5 mg by mouth at bedtime. , Disp: , Rfl:  .  venlafaxine XR (EFFEXOR-XR) 150 MG 24 hr capsule, Take 1 capsule (150 mg total) by mouth daily with breakfast. (Patient not taking: Reported on 04/29/2020), Disp: 90 capsule, Rfl: 3 .  venlafaxine XR (EFFEXOR-XR) 75 MG 24 hr capsule, Take 225 mg  by mouth daily with breakfast., Disp: , Rfl:  No current facility-administered medications for this visit.  Facility-Administered Medications Ordered in Other Visits:  .  [COMPLETED] sodium chloride 0.9 % nebulizer solution 3 mL, 3 mL, Nebulization, Once, 3 mL at 10/03/18 1130 **FOLLOWED BY** [COMPLETED] methacholine (PROVOCHOLINE) inhaler solution 0.125 mg, 2 mL, Inhalation, Once, 0.125 mg at 10/03/18 1139 **FOLLOWED BY** methacholine (PROVOCHOLINE) inhaler solution 0.5 mg, 2 mL, Inhalation, Once **FOLLOWED BY** methacholine (PROVOCHOLINE)  inhaler solution 2 mg, 2 mL, Inhalation, Once **FOLLOWED BY** methacholine (PROVOCHOLINE) inhaler solution 8 mg, 2 mL, Inhalation, Once **FOLLOWED BY** methacholine (PROVOCHOLINE) inhaler solution 32 mg, 2 mL, Inhalation, Once **FOLLOWED BY** [COMPLETED] albuterol (PROVENTIL) (2.5 MG/3ML) 0.083% nebulizer solution 2.5 mg, 2.5 mg, Nebulization, Once, Ottie Glazier, MD, 2.5 mg at 10/03/18 1146  Past Medical History: Past Medical History:  Diagnosis Date  . Arthritis   . Asthma   . Chronic cough   . Colon polyps   . Depression   . GERD (gastroesophageal reflux disease)   . Hiatal hernia    LARGE  . History of chicken pox   . Hypertension   . Macular degeneration   . Multinodular goiter    FNA in past neg h/o thyroid cysts  . Pernicious anemia    PERNICIOUS   . RLS (restless legs syndrome)   . Sleep apnea    NO CPAP    Tobacco Use: Social History   Tobacco Use  Smoking Status Never Smoker  Smokeless Tobacco Never Used    Labs: Recent Review Scientist, physiological    Labs for ITP Cardiac and Pulmonary Rehab Latest Ref Rng & Units 10/12/2018   Hemoglobin A1c 4.6 - 6.5 % 5.6       Pulmonary Assessment Scores:  Pulmonary Assessment Scores    Row Name 04/29/20 0834         ADL UCSD   ADL Phase Entry     SOB Score total 29     Rest 0     Walk 0     Stairs 5     Bath 0     Dress 0     Shop 1       CAT Score   CAT Score 26       mMRC Score   mMRC Score 1            UCSD: Self-administered rating of dyspnea associated with activities of daily living (ADLs) 6-point scale (0 = "not at all" to 5 = "maximal or unable to do because of breathlessness")  Scoring Scores range from 0 to 120.  Minimally important difference is 5 units  CAT: CAT can identify the health impairment of COPD patients and is better correlated with disease progression.  CAT has a scoring range of zero to 40. The CAT score is classified into four groups of low (less than 10), medium (10 - 20), high  (21-30) and very high (31-40) based on the impact level of disease on health status. A CAT score over 10 suggests significant symptoms.  A worsening CAT score could be explained by an exacerbation, poor medication adherence, poor inhaler technique, or progression of COPD or comorbid conditions.  CAT MCID is 2 points  mMRC: mMRC (Modified Medical Research Council) Dyspnea Scale is used to assess the degree of baseline functional disability in patients of respiratory disease due to dyspnea. No minimal important difference is established. A decrease in score of 1 point or greater is considered a positive change.  Pulmonary Function Assessment:  Pulmonary Function Assessment - 04/29/20 0756      Breath   Bilateral Breath Sounds Clear    Shortness of Breath No;Limiting activity           Exercise Target Goals: Exercise Program Goal: Individual exercise prescription set using results from initial 6 min walk test and THRR while considering  patient's activity barriers and safety.   Exercise Prescription Goal: Initial exercise prescription builds to 30-45 minutes a day of aerobic activity, 2-3 days per week.  Home exercise guidelines will be given to patient during program as part of exercise prescription that the participant will acknowledge.  Education: Aerobic Exercise & Resistance Training: - Gives group verbal and written instruction on the various components of exercise. Focuses on aerobic and resistive training programs and the benefits of this training and how to safely progress through these programs..   Education: Exercise & Equipment Safety: - Individual verbal instruction and demonstration of equipment use and safety with use of the equipment.   Pulmonary Rehab from 05/22/2020 in [facility] Card[facility] Pulmonary Rehab  Date 04/29/20  Educator [facility]  Instruction Review Code 1- Verbalizes Understanding      Education: Exercise Physiology & General Exercise Guidelines: - Group verbal  and written instruction with models to review the exercise physiology of the cardiovascular system and associated critical values. Provides general exercise guidelines with specific guidelines to those with heart or lung disease.    Education: Flexibility, Balance, Mind/Body Relaxation: Provides group verbal/written instruction on the benefits of flexibility and balance training, including mind/body exercise modes such as yoga, pilates and tai chi.  Demonstration and skill practice provided.   Activity Barriers & Risk Stratification:  Activity Barriers & Cardiac Risk Stratification - 04/29/20 0757      Activity Barriers & Cardiac Risk Stratification   Activity Barriers Deconditioning;Joint Problems;Other (comment);Muscular Weakness;Arthritis;Shortness of Breath;Right Knee Replacement;Balance Concerns    Comments TKR completed July 2021           6 Minute Walk:  6 Minute Walk    Row Name 04/29/20 0903         6 Minute Walk   Phase Initial     Distance 1320 feet     Walk Time 6 minutes     # of Rest Breaks 0     MPH 2.5     METS 2.34     RPE 13     Perceived Dyspnea  2     VO2 Peak 8.2     Symptoms Yes (comment)     Comments SOB     Resting HR 76 bpm     Resting BP 134/74     Resting Oxygen Saturation  95 %     Exercise Oxygen Saturation  during 6 min walk 89 %     Max Ex. HR 108 bpm     Max Ex. BP 144/66     2 Minute Post BP 136/70       Interval HR   1 Minute HR 100     2 Minute HR 98     3 Minute HR 100     4 Minute HR 106     5 Minute HR 101     6 Minute HR 108     2 Minute Post HR 86     Interval Heart Rate? Yes       Interval Oxygen   Interval Oxygen? Yes     Baseline Oxygen Saturation % 95 %  1 Minute Oxygen Saturation % 91 %     1 Minute Liters of Oxygen 0 L  Room Air     2 Minute Oxygen Saturation % 89 %     2 Minute Liters of Oxygen 0 L     3 Minute Oxygen Saturation % 92 %     3 Minute Liters of Oxygen 0 L     4 Minute Oxygen Saturation % 93  %     4 Minute Liters of Oxygen 0 L     5 Minute Oxygen Saturation % 94 %     5 Minute Liters of Oxygen 0 L     6 Minute Oxygen Saturation % 94 %     6 Minute Liters of Oxygen 0 L     2 Minute Post Oxygen Saturation % 96 %     2 Minute Post Liters of Oxygen 0 L           Oxygen Initial Assessment:  Oxygen Initial Assessment - 04/29/20 0756      Home Oxygen   Home Oxygen Device None    Sleep Oxygen Prescription None    Home Exercise Oxygen Prescription None    Home Resting Oxygen Prescription None      Initial 6 min Walk   Oxygen Used None      Program Oxygen Prescription   Program Oxygen Prescription None      Intervention   Short Term Goals To learn and understand importance of monitoring SPO2 with pulse oximeter and demonstrate accurate use of the pulse oximeter.;To learn and understand importance of maintaining oxygen saturations>88%;To learn and demonstrate proper pursed lip breathing techniques or other breathing techniques.;To learn and demonstrate proper use of respiratory medications    Long  Term Goals Verbalizes importance of monitoring SPO2 with pulse oximeter and return demonstration;Maintenance of O2 saturations>88%;Exhibits proper breathing techniques, such as pursed lip breathing or other method taught during program session;Compliance with respiratory medication;Demonstrates proper use of MDI's           Oxygen Re-Evaluation:  Oxygen Re-Evaluation    Row Name 05/13/20 0802 07/01/20 0813           Program Oxygen Prescription   Program Oxygen Prescription None None        Home Oxygen   Home Oxygen Device None None      Sleep Oxygen Prescription None CPAP  She has not recieved it yet      Home Exercise Oxygen Prescription None None      Home Resting Oxygen Prescription None None        Goals/Expected Outcomes   Short Term Goals To learn and understand importance of monitoring SPO2 with pulse oximeter and demonstrate accurate use of the pulse  oximeter.;To learn and understand importance of maintaining oxygen saturations>88%;To learn and demonstrate proper pursed lip breathing techniques or other breathing techniques.;To learn and demonstrate proper use of respiratory medications To learn and understand importance of monitoring SPO2 with pulse oximeter and demonstrate accurate use of the pulse oximeter.;To learn and understand importance of maintaining oxygen saturations>88%;To learn and demonstrate proper pursed lip breathing techniques or other breathing techniques.;To learn and demonstrate proper use of respiratory medications      Long  Term Goals Verbalizes importance of monitoring SPO2 with pulse oximeter and return demonstration;Maintenance of O2 saturations>88%;Exhibits proper breathing techniques, such as pursed lip breathing or other method taught during program session;Compliance with respiratory medication;Demonstrates proper use of MDI's Verbalizes importance of monitoring SPO2 with  pulse oximeter and return demonstration;Maintenance of O2 saturations>88%;Exhibits proper breathing techniques, such as pursed lip breathing or other method taught during program session;Compliance with respiratory medication;Demonstrates proper use of MDI's      Comments Tahlia is struggling with her breathing.  She is supposed to use her nebulizer twice a day but only getting once a day.  She is trying to use her pursed lip breathing some.  She coughing more and needs her inhaler more often.  She is going to try to use her nebulizer the twice a day. Her sats have been good. Denny reports trying to remember to due pursed lip breathing, but it doesn't feel natural still. She takes her nebulizer in the morning, but not at night due to shaking.      Goals/Expected Outcomes Short: Nebulizer twice a day Long: Conitnue to work on improving breathing. ST: continue to practice PLB LT: use PLB during activity.             Oxygen Discharge (Final Oxygen  Re-Evaluation):  Oxygen Re-Evaluation - 07/01/20 0813      Program Oxygen Prescription   Program Oxygen Prescription None      Home Oxygen   Home Oxygen Device None    Sleep Oxygen Prescription CPAP   She has not recieved it yet   Home Exercise Oxygen Prescription None    Home Resting Oxygen Prescription None      Goals/Expected Outcomes   Short Term Goals To learn and understand importance of monitoring SPO2 with pulse oximeter and demonstrate accurate use of the pulse oximeter.;To learn and understand importance of maintaining oxygen saturations>88%;To learn and demonstrate proper pursed lip breathing techniques or other breathing techniques.;To learn and demonstrate proper use of respiratory medications    Long  Term Goals Verbalizes importance of monitoring SPO2 with pulse oximeter and return demonstration;Maintenance of O2 saturations>88%;Exhibits proper breathing techniques, such as pursed lip breathing or other method taught during program session;Compliance with respiratory medication;Demonstrates proper use of MDI's    Comments Agapita reports trying to remember to due pursed lip breathing, but it doesn't feel natural still. She takes her nebulizer in the morning, but not at night due to shaking.    Goals/Expected Outcomes ST: continue to practice PLB LT: use PLB during activity.           Initial Exercise Prescription:  Initial Exercise Prescription - 04/29/20 0900      Date of Initial Exercise RX and Referring Provider   Date 04/29/20    Referring Provider Claudette Stapler MD      Treadmill   MPH 2    Grade 0.5    Minutes 15    METs 2.67      REL-XR   Level 1    Speed 50    Minutes 15    METs 2.6      T5 Nustep   Level 2    SPM 80    Minutes 15    METs 2.6      Prescription Details   Frequency (times per week) 2    Duration Progress to 30 minutes of continuous aerobic without signs/symptoms of physical distress      Intensity   THRR 40-80% of Max  Heartrate 106-135    Ratings of Perceived Exertion 11-13    Perceived Dyspnea 0-4      Progression   Progression Continue to progress workloads to maintain intensity without signs/symptoms of physical distress.      Resistance Training  Training Prescription Yes    Weight 3 lb    Reps 10-15           Perform Capillary Blood Glucose checks as needed.  Exercise Prescription Changes:  Exercise Prescription Changes    Row Name 04/29/20 0900 05/13/20 1500 05/26/20 1800         Response to Exercise   Blood Pressure (Admit) 134/74 120/72 122/66     Blood Pressure (Exercise) 144/66 136/74 132/78     Blood Pressure (Exit) 128/60 124/70 122/74     Heart Rate (Admit) 76 bpm 74 bpm 88 bpm     Heart Rate (Exercise) 108 bpm 89 bpm 97 bpm     Heart Rate (Exit) 86 bpm 85 bpm 90 bpm     Oxygen Saturation (Admit) 95 % 97 % 94 %     Oxygen Saturation (Exercise) 89 % 94 % 96 %     Oxygen Saturation (Exit) 94 % 95 % 95 %     Rating of Perceived Exertion (Exercise) _0 Perceived Dyspnea (Exercise) _1 Symptoms SOB SOB --     Comments walk test results -- --     Duration -- Continue with 30 min of aerobic exercise without signs/symptoms of physical distress. Continue with 30 min of aerobic exercise without signs/symptoms of physical distress.     Intensity -- THRR unchanged THRR unchanged       Progression   Progression -- Continue to progress workloads to maintain intensity without signs/symptoms of physical distress. Continue to progress workloads to maintain intensity without signs/symptoms of physical distress.     Average METs -- 2.38 2.3       Resistance Training   Training Prescription -- Yes Yes     Weight -- 3 lb  3lb     Reps -- 10-15 10-15       Interval Training   Interval Training -- No No       Treadmill   MPH -- 2.2 --     Grade -- 0.5 --     Minutes -- 15 --     METs -- 2.84 --       REL-XR   Level -- 1 3     Speed -- -- 50     Minutes -- 15 15      METs -- 2 --       T5 Nustep   Level -- 5 3     SPM -- -- 80     Minutes -- 15 15     METs -- 2.3 2.3       Home Exercise Plan   Plans to continue exercise at -- Home (comment)  walking, gym --     Frequency -- Add 2 additional days to program exercise sessions. --     Initial Home Exercises Provided -- 05/13/20 --            Exercise Comments:  Exercise Comments    Row Name 05/22/20 0747 06/17/20 0719         Exercise Comments Camanche saw the doctor yesterday and they started her on prednisone for her knee.  She is also supposed to stay off the treadmill for 3 weeks as well.  We also talked about making sure she continues to exercise to stay out of her depression. [facility] called and said she will not be here for the next two weeks.  She also left  message that her physician has started her with PT for her knee.             Exercise Goals and Review:  Exercise Goals    Row Name 04/29/20 0909             Exercise Goals   Increase Physical Activity Yes       Intervention Provide advice, education, support and counseling about physical activity/exercise needs.;Develop an individualized exercise prescription for aerobic and resistive training based on initial evaluation findings, risk stratification, comorbidities and participant's personal goals.       Expected Outcomes Short Term: Attend rehab on a regular basis to increase amount of physical activity.;Long Term: Add in home exercise to make exercise part of routine and to increase amount of physical activity.;Long Term: Exercising regularly at least 3-5 days a week.       Increase Strength and Stamina Yes       Intervention Provide advice, education, support and counseling about physical activity/exercise needs.;Develop an individualized exercise prescription for aerobic and resistive training based on initial evaluation findings, risk stratification, comorbidities and participant's personal goals.       Expected Outcomes  Short Term: Increase workloads from initial exercise prescription for resistance, speed, and METs.;Short Term: Perform resistance training exercises routinely during rehab and add in resistance training at home;Long Term: Improve cardiorespiratory fitness, muscular endurance and strength as measured by increased METs and functional capacity (6MWT)       Able to understand and use rate of perceived exertion (RPE) scale Yes       Intervention Provide education and explanation on how to use RPE scale       Expected Outcomes Short Term: Able to use RPE daily in rehab to express subjective intensity level;Long Term:  Able to use RPE to guide intensity level when exercising independently       Able to understand and use Dyspnea scale Yes       Intervention Provide education and explanation on how to use Dyspnea scale       Expected Outcomes Short Term: Able to use Dyspnea scale daily in rehab to express subjective sense of shortness of breath during exertion;Long Term: Able to use Dyspnea scale to guide intensity level when exercising independently       Knowledge and understanding of Target Heart Rate Range (THRR) Yes       Intervention Provide education and explanation of THRR including how the numbers were predicted and where they are located for reference       Expected Outcomes Short Term: Able to state/look up THRR;Long Term: Able to use THRR to govern intensity when exercising independently;Short Term: Able to use daily as guideline for intensity in rehab       Able to check pulse independently Yes       Intervention Provide education and demonstration on how to check pulse in carotid and radial arteries.;Review the importance of being able to check your own pulse for safety during independent exercise       Expected Outcomes Short Term: Able to explain why pulse checking is important during independent exercise;Long Term: Able to check pulse independently and accurately       Understanding of Exercise  Prescription Yes       Intervention Provide education, explanation, and written materials on patient's individual exercise prescription       Expected Outcomes Short Term: Able to explain program exercise prescription;Long Term: Able to explain home exercise prescription to  exercise independently              Exercise Goals Re-Evaluation :  Exercise Goals Re-Evaluation    Row Name 05/01/20 0755 05/13/20 0805 05/26/20 1804 06/10/20 1632 07/01/20 0809     Exercise Goal Re-Evaluation   Exercise Goals Review Increase Physical Activity;Able to understand and use rate of perceived exertion (RPE) scale;Knowledge and understanding of Target Heart Rate Range (THRR);Understanding of Exercise Prescription;Able to check pulse independently;Increase Strength and Stamina Increase Physical Activity;Able to understand and use rate of perceived exertion (RPE) scale;Knowledge and understanding of Target Heart Rate Range (THRR);Understanding of Exercise Prescription;Able to check pulse independently;Increase Strength and Stamina;Able to understand and use Dyspnea scale Increase Physical Activity;Able to understand and use rate of perceived exertion (RPE) scale;Knowledge and understanding of Target Heart Rate Range (THRR);Understanding of Exercise Prescription;Able to check pulse independently;Increase Strength and Stamina;Able to understand and use Dyspnea scale -- Increase Physical Activity;Able to understand and use rate of perceived exertion (RPE) scale;Knowledge and understanding of Target Heart Rate Range (THRR);Understanding of Exercise Prescription;Able to check pulse independently;Increase Strength and Stamina;Able to understand and use Dyspnea scale   Comments Reviewed RPE and dyspnea scales, THR and program prescription with pt today.  Pt voiced understanding and was given a copy of goals to take home. Reviewed home exercise with pt today.  Pt plans to walk at home for exercise.  Reviewed THR, pulse, RPE, sign  and symptoms, pulse oximetery and when to call 911 or MD.  Also discussed weather considerations and indoor options.  Pt voiced understanding.  Anays is going to get back to her exercise routine. Alyvia has a bruised meniscus and her Dr recommended her not do the TM for now.  Staff will monitor progress. out since last review Monigue is back for the first time since her last review. She has not been exercising at home due to knee problems - starting PT on Friday. MD says she can walk on a flat surface and seated machines that don't irritate her knee (XR hurt her knee). Will monitor to see how she progresses and re-evaluate next goal review.   Expected Outcomes Short: Use RPE daily to regulate intensity. Long: Follow program prescription in THR. Short: Get back to exericse routine.  Long: Continue to improve stamina. Short:  maintain consistent exercise Long: get back to walking -- ST: attend rehab and PT LT: exercise 150 minutes of moderate or 70 minutes of vigorous exercise per week with at least 2 days of weight training.          Discharge Exercise Prescription (Final Exercise Prescription Changes):  Exercise Prescription Changes - 05/26/20 1800      Response to Exercise   Blood Pressure (Admit) 122/66    Blood Pressure (Exercise) 132/78    Blood Pressure (Exit) 122/74    Heart Rate (Admit) 88 bpm    Heart Rate (Exercise) 97 bpm    Heart Rate (Exit) 90 bpm    Oxygen Saturation (Admit) 94 %    Oxygen Saturation (Exercise) 96 %    Oxygen Saturation (Exit) 95 %    Rating of Perceived Exertion (Exercise) 14    Perceived Dyspnea (Exercise) 1    Duration Continue with 30 min of aerobic exercise without signs/symptoms of physical distress.    Intensity THRR unchanged      Progression   Progression Continue to progress workloads to maintain intensity without signs/symptoms of physical distress.    Average METs 2.3      Resistance  Training   Training Prescription Yes    Weight  3lb    Reps  10-15      Interval Training   Interval Training No      REL-XR   Level 3    Speed 50    Minutes 15      T5 Nustep   Level 3    SPM 80    Minutes 15    METs 2.3           Nutrition:  Target Goals: Understanding of nutrition guidelines, daily intake of sodium <1527m, cholesterol <2050m calories 30% from fat and 7% or less from saturated fats, daily to have 5 or more servings of fruits and vegetables.  Education: Controlling Sodium/Reading Food Labels -Group verbal and written material supporting the discussion of sodium use in heart healthy nutrition. Review and explanation with models, verbal and written materials for utilization of the food label.   Education: General Nutrition Guidelines/Fats and Fiber: -Group instruction provided by verbal, written material, models and posters to present the general guidelines for heart healthy nutrition. Gives an explanation and review of dietary fats and fiber.   [facility]  Date 05/08/20  [facility] 1- Verbalizes Understanding      Biometrics:  Pre Biometrics - 04/29/20 0909      Pre Biometrics   Height 5' 5.7" (1.669 m)    Weight 251 lb 1.6 oz (113.9 kg)    BMI (Calculated) 40.89    Single Leg Stand 6.47 seconds            Nutrition Therapy Plan and Nutrition Goals:  Nutrition Therapy & Goals - 06/30/20 1624      Personal Nutrition Goals   Nutrition Goal unable to meet with pt yet due to scheduling and pt attendance           Nutrition Assessments:  Nutrition Assessments - 04/29/20 0851      MEDFICTS Scores   Pre Score 42           MEDIFICTS Score Key:          ?70 Need to make dietary changes          40-70 Heart Healthy Diet         ? 40 Therapeutic Level Cholesterol Diet  Nutrition Goals Re-Evaluation:   Nutrition Goals Discharge (Final Nutrition Goals Re-Evaluation):   Psychosocial: Target Goals: Acknowledge  presence or absence of significant depression and/or stress, maximize coping skills, provide positive support system. Participant is able to verbalize types and ability to use techniques and skills needed for reducing stress and depression.   Education: Depression - Provides group verbal and written instruction on the correlation between heart/lung disease and depressed mood, treatment options, and the stigmas associated with seeking treatment.   Education: Sleep Hygiene -Provides group verbal and written instruction about how sleep can affect your health.  Define sleep hygiene, discuss sleep cycles and impact of sleep habits. Review good sleep hygiene tips.    Education: Stress and Anxiety: - Provides group verbal and written instruction about the health risks of elevated stress and causes of high stress.  Discuss the correlation between heart/lung disease and anxiety and treatment options. Review healthy ways to manage with stress and anxiety.   Initial Review & Psychosocial Screening:  Initial Psych Review & Screening - 04/29/20 0758      Initial Review   Current issues with Current Stress  Concerns;History of Depression;Current Depression;Current Sleep Concerns    Source of Stress Concerns Chronic Illness;Financial    Comments PHQ is 5, no feeling of depression currently, not sleeping great 4-5 hours a night (going to bed late and up early), finances have gotten better      [place]? Yes   Sharla Kidney lives near by, [place] friend April (moving this weekend)   Concerns Inappropriate over/under dependence on family/friends      Barriers   Psychosocial barriers to participate in program The patient should benefit from training in stress management and relaxation.;Psychosocial barriers identified (see note)      Screening Interventions   Interventions Provide feedback about the scores to participant;To provide support and resources with identified psychosocial  needs;Encouraged to exercise    Expected Outcomes Short Term goal: Utilizing psychosocial counselor, staff and physician to assist with identification of specific Stressors or current issues interfering with healing process. Setting desired goal for each stressor or current issue identified.;Long Term Goal: Stressors or current issues are controlled or eliminated.;Short Term goal: Identification and review with participant of any Quality of Life or Depression concerns found by scoring the questionnaire.;Long Term goal: The participant improves quality of Life and PHQ9 Scores as seen by post scores and/or verbalization of changes           Quality of Life Scores:  Scores of 19 and below usually indicate a poorer quality of life in these areas.  A difference of  2-3 points is a clinically meaningful difference.  A difference of 2-3 points in the total score of the Quality of Life Index has been associated with significant improvement in overall quality of life, self-image, physical symptoms, and general health in studies assessing change in quality of life.  PHQ-9: Recent Review Flowsheet Data    Depression screen [facility] 2/9 05/13/2020 04/29/2020 12/24/2019 12/10/2019 10/29/2019   Decreased Interest 0 0 0 2 0   Down, Depressed, Hopeless 0 0 0 3 0   PHQ - 2 Score 0 0 0 5 0   Altered sleeping _0 0   Tired, decreased energy _1 0   Change in appetite 1 0 0 0 0   Feeling bad or failure about yourself  0 0 0 1 0   Trouble concentrating _2 0   Moving slowly or fidgety/restless 0 0 0 0 0   Suicidal thoughts 0 0 0 0 0   PHQ-9 Score _3 0   Difficult doing work/chores Somewhat difficult Somewhat difficult Not difficult at all Somewhat difficult Not difficult at all     Interpretation of Total Score  Total Score Depression Severity:  1-4 = Minimal depression, 5-9 = Mild depression, 10-14 = Moderate depression, 15-19 = Moderately severe depression, 20-27 = Severe depression    Psychosocial Evaluation and Intervention:  Psychosocial Evaluation - 04/29/20 0800      Psychosocial Evaluation & Interventions   Interventions Stress management education;Encouraged to exercise with the program and follow exercise prescription    Comments Kaislyn is coming back to pulmonary rehab with her asthma that has gotten worse since surgery.  She wants to focus on getting on her nebulizer and get back to walking longer.  She has a few new friends, but one is moving away this weekend.  She wants to get her weight back down too. Her finances are doing better.  Sleep continues to struggle for her.  Expected Outcomes Short: Attend rehab to improve breathing and endurance  Long: Continue to focus on positive and use mental boost    Continue Psychosocial Services  Follow up required by staff           Psychosocial Re-Evaluation:  Psychosocial Re-Evaluation    Row Name 05/13/20 0757 07/01/20 0814           Psychosocial Re-Evaluation   Current issues with Current Stress Concerns;Current Sleep Concerns History of Depression;Current Psychotropic Meds;Current Depression;Current Sleep Concerns      Comments Josee is doing well in rehab.  She is sleeping pretty good, but still is staying up late.  She is trying to get better.  Her cat is doing better to with sleep.  She is going to get started on her home exercise again.  Her PHQ is the same, but shifting around some. She is on Effexor to manage depression. She reports no symptoms of depression in last couple of weeks and does not go to talk therapy. She reports that she can't find anyone for talk therapy. Discussed some options to look for therapists including [facility] since she is not having luck finding anyone in person who is taking new patients. She will read, crochet to reduce stress. She reports getting 4-5 hours of sleep each night - sleep maintenance insomia - tried melatonin, but didn't see any difference. Discussed sleep hygiene.       Expected Outcomes Short: Get back to exercise routine for mood boost  Long: Continue to focus on positive. ST: Find therapist, try sleep hygiene techniques. LT: continue to manage depression, sleep throughout the night.      Interventions Encouraged to attend Pulmonary Rehabilitation for the exercise Encouraged to attend Pulmonary Rehabilitation for the exercise      Continue Psychosocial Services  Follow up required by staff Follow up required by staff      Comments -- She reports no current stress concerns, she is still working to manage her depression and sleep.             Psychosocial Discharge (Final Psychosocial Re-Evaluation):  Psychosocial Re-Evaluation - 07/01/20 0814      Psychosocial Re-Evaluation   Current issues with History of Depression;Current Psychotropic Meds;Current Depression;Current Sleep Concerns    Comments She is on Effexor to manage depression. She reports no symptoms of depression in last couple of weeks and does not go to talk therapy. She reports that she can't find anyone for talk therapy. Discussed some options to look for therapists including [facility] since she is not having luck finding anyone in person who is taking new patients. She will read, crochet to reduce stress. She reports getting 4-5 hours of sleep each night - sleep maintenance insomia - tried melatonin, but didn't see any difference. Discussed sleep hygiene.    Expected Outcomes ST: Find therapist, try sleep hygiene techniques. LT: continue to manage depression, sleep throughout the night.    Interventions Encouraged to attend Pulmonary Rehabilitation for the exercise    Continue Psychosocial Services  Follow up required by staff    Comments She reports no current stress concerns, she is still working to manage her depression and sleep.           Education: Education Goals: Education classes will be provided on a weekly basis, covering required topics. Participant will state  understanding/return demonstration of topics presented.  Learning Barriers/Preferences:  Learning Barriers/Preferences - 04/29/20 0803      Learning Barriers/Preferences   Learning Barriers None  Learning Preferences None           General Pulmonary Education Topics:  Infection Prevention: - Provides verbal and written material to individual with discussion of infection control including proper hand washing and proper equipment cleaning during exercise session.   Pulmonary Rehab from 05/22/2020 in [facility] Cardiac and Pulmonary Rehab  Date 04/29/20  Educator [facility]  Instruction Review Code 1- Verbalizes Understanding      Falls Prevention: - Provides verbal and written material to individual with discussion of falls prevention and safety.   Pulmonary Rehab from 05/22/2020 in [facility] Cardiac and Pulmonary Rehab  Date 04/29/20  Educator [facility]  Instruction Review Code 1- Verbalizes Understanding      Chronic Lung Diseases: - Group verbal and written instruction to review updates, respiratory medications, advancements in procedures and treatments. Discuss use of supplemental oxygen including available portable oxygen systems, continuous and intermittent flow rates, concentrators, personal use and safety guidelines. Review proper use of inhaler and spacers. Provide informative websites for self-education.    Pulmonary Rehab from 05/22/2020 in [facility] Cardiac and Pulmonary Rehab  Date 04/29/20  Instruction Review Code 3- Needs Reinforcement  [need identified]      Energy Conservation: - Provide group verbal and written instruction for methods to conserve energy, plan and organize activities. Instruct on pacing techniques, use of adaptive equipment and posture/positioning to relieve shortness of breath.   Triggers and Exacerbations: - Group verbal and written instruction to review types of environmental triggers and ways to prevent exacerbations. Discuss weather changes, air quality and the  benefits of nasal washing. Review warning signs and symptoms to help prevent infections. Discuss techniques for effective airway clearance, coughing, and vibrations.   AED/CPR: - Group verbal and written instruction with the use of models to demonstrate the basic use of the AED with the basic ABC's of resuscitation.   Anatomy and Physiology of the Lungs: - Group verbal and written instruction with the use of models to provide basic lung anatomy and physiology related to function, structure and complications of lung disease.   Anatomy & Physiology of the Heart: - Group verbal and written instruction and models provide basic cardiac anatomy and physiology, with the coronary electrical and arterial systems. Review of Valvular disease and Heart Failure   Cardiac Medications: - Group verbal and written instruction to review commonly prescribed medications for heart disease. Reviews the medication, class of the drug, and side effects.   Other: -Provides group and verbal instruction on various topics (see comments)   Knowledge Questionnaire Score:  Knowledge Questionnaire Score - 04/29/20 0851      Knowledge Questionnaire Score   Pre Score 16/18 Education Focus: O2 safety            Core Components/Risk Factors/Patient Goals at Admission:  Personal Goals and Risk Factors at Admission - 04/29/20 0803      Core Components/Risk Factors/Patient Goals on Admission    Weight Management Yes;Obesity;Weight Loss    Intervention Weight Management: Develop a combined nutrition and exercise program designed to reach desired caloric intake, while maintaining appropriate intake of nutrient and fiber, sodium and fats, and appropriate energy expenditure required for the weight goal.;Weight Management/Obesity: Establish reasonable short term and long term weight goals.;Weight Management: Provide education and appropriate resources to help participant work on and attain dietary goals.;Obesity: Provide  education and appropriate resources to help participant work on and attain dietary goals.    Admit Weight 251 lb 1.6 oz (113.9 kg)    Goal Weight: Short  Term 245 lb (111.1 kg)    Goal Weight: Long Term 240 lb (108.9 kg)    Expected Outcomes Short Term: Continue to assess and modify interventions until short term weight is achieved;Long Term: Adherence to nutrition and physical activity/exercise program aimed toward attainment of established weight goal;Weight Loss: Understanding of general recommendations for a balanced deficit meal plan, which promotes 1-2 lb weight loss per week and includes a negative energy balance of [phone] kcal/d;Understanding recommendations for meals to include 15-35% energy as protein, 25-35% energy from fat, 35-60% energy from carbohydrates, less than 278m of dietary cholesterol, 20-35 gm of total fiber daily;Understanding of distribution of calorie intake throughout the day with the consumption of 4-5 meals/snacks    Improve shortness of breath with ADL's Yes    Intervention Provide education, individualized exercise plan and daily activity instruction to help decrease symptoms of SOB with activities of daily living.    Expected Outcomes Short Term: Improve cardiorespiratory fitness to achieve a reduction of symptoms when performing ADLs;Long Term: Be able to perform more ADLs without symptoms or delay the onset of symptoms    Hypertension Yes    Intervention Provide education on lifestyle modifcations including regular physical activity/exercise, weight management, moderate sodium restriction and increased consumption of fresh fruit, vegetables, and low fat dairy, alcohol moderation, and smoking cessation.;Monitor prescription use compliance.    Expected Outcomes Long Term: Maintenance of blood pressure at goal levels.;Short Term: Continued assessment and intervention until BP is < 140/989mHG in hypertensive participants. < 130/8042mG in hypertensive participants with  diabetes, heart failure or chronic kidney disease.    Lipids Yes    Intervention Provide education and support for participant on nutrition & aerobic/resistive exercise along with prescribed medications to achieve LDL <42m18mDL >40mg37m Expected Outcomes Short Term: Participant states understanding of desired cholesterol values and is compliant with medications prescribed. Participant is following exercise prescription and nutrition guidelines.;Long Term: Cholesterol controlled with medications as prescribed, with individualized exercise RX and with personalized nutrition plan. Value goals: LDL < 42mg,52m > 40 mg.           Education:Diabetes - Individual verbal and written instruction to review signs/symptoms of diabetes, desired ranges of glucose level fasting, after meals and with exercise. Acknowledge that pre and post exercise glucose checks will be done for 3 sessions at entry of program.   Education: Know Your Numbers and Risk Factors: -Group verbal and written instruction about important numbers in your health.  Discussion of what are risk factors and how they play a role in the disease process.  Review of Cholesterol, Blood Pressure, Diabetes, and BMI and the role they play in your overall health.   [facility]  Date 05/22/20  Educator SB  Instruction Review Code 1- Verbalizes Understanding      Core Components/Risk Factors/Patient Goals Review:   Goals and Risk Factor Review    Row Name 05/13/20 0800 07/01/20 0826           Core Components/Risk Factors/Patient Goals Review   Personal Goals Review Weight Management/Obesity;Improve shortness of breath with ADL's;Hypertension;Lipids Weight Management/Obesity;Improve shortness of breath with ADL's;Hypertension      Review CherryJashleyf to a good start in rehab.  Her weight was up today as she was eating over the weekend.  She is determined to get back to exercise to help  curb her eating.  Blood pressures have been doing good in class  but she is not checking at home since they have been doing better.  Her breathing is still a bit of a stuggle recently. She is trying to get her neublizer im She reports her weight has been stable - has not met with RD regarding nutrition yet. She reports SOB is good unless she has a coughing spell - hasn't had to use her inhaler in a week or two. She only takes nebulizer in morning because it makes her shake. She reports her BP has been good at rehab, she doesn't have a BP cuff , but will buy one.      Expected Outcomes Short: Get back to nebulizer  Long: Work on weight loss. ST: Get BP cuff, continue to take nebulizer. LT: meet with RD             Core Components/Risk Factors/Patient Goals at Discharge (Final Review):   Goals and Risk Factor Review - 07/01/20 0826      Core Components/Risk Factors/Patient Goals Review   Personal Goals Review Weight Management/Obesity;Improve shortness of breath with ADL's;Hypertension    Review She reports her weight has been stable - has not met with RD regarding nutrition yet. She reports SOB is good unless she has a coughing spell - hasn't had to use her inhaler in a week or two. She only takes nebulizer in morning because it makes her shake. She reports her BP has been good at rehab, she doesn't have a BP cuff , but will buy one.    Expected Outcomes ST: Get BP cuff, continue to take nebulizer. LT: meet with RD           ITP Comments:  ITP Comments    Row Name 04/29/20 0805 05/01/20 0755 05/06/20 0717 05/07/20 1548 05/22/20 0746   ITP Comments Completed 6MWT and gym orientation. Initial ITP created and sent for review to Dr. Emily Filbert, Medical Director. Documentaiton for diagnosis can be found in Care Everywhere encounter 04/10/2020. First full day of exercise!  Patient was oriented to gym and equipment including functions, settings, policies, and procedures.  Patient's individual exercise  prescription and treatment plan were reviewed.  All starting workloads were established based on the results of the 6 minute walk test done at initial orientation visit.  The plan for exercise progression was also introduced and progression will be customized based on patient's performance and goals pt fell and hurt knee at home yesterday 30 day review completed. ITP sent to Dr. Emily Filbert, Medical Director of Cardiac and Pulmonary Rehab. Continue with ITP unless changes are made by physician. Rule saw the doctor yesterday and they started her on prednisone for her knee.  She is also supposed to stay off the treadmill for 3 weeks as well.   Rafael Capo Name 06/04/20 0548 06/10/20 1631 06/17/20 0719 06/25/20 1453 07/02/20 0640   ITP Comments 30 Day review completed. Medical Director ITP review done, changes made as directed, and signed approval by Medical Director. Jezelle has called out the past couple of weeks with knee pain.  She is supposed to see her doctor this week to see what is going on.  She will be out of town on Thursday. Post called and said she will not be here for the next two weeks.  She also left message that her physician has started her with PT for her knee. Pt has not attended since last review. 30 Day review completed. Medical Director ITP review done, changes made as directed, and signed approval by  Medical Director.          Comments:

## 2020-07-03 ENCOUNTER — Encounter: Payer: Medicare Other | Admitting: *Deleted

## 2020-07-03 ENCOUNTER — Other Ambulatory Visit: Payer: Self-pay

## 2020-07-03 DIAGNOSIS — J4550 Severe persistent asthma, uncomplicated: Secondary | ICD-10-CM

## 2020-07-03 NOTE — Progress Notes (Signed)
Daily Session Note  Patient Details  Name: Sharon Cervantes MRN: [id number] Date of Birth: Sep 16, 1950 Referring Provider:     Pulmonary Rehab from 04/29/2020 in [facility] Cardiac and Pulmonary Rehab  Referring Provider Claudette Stapler MD      Encounter Date: 07/03/2020  Check In:      Social History   Tobacco Use  Smoking Status Never Smoker  Smokeless Tobacco Never Used    Goals Met:  Proper associated with RPD/PD & O2 Sat Independence with exercise equipment Exercise tolerated well No report of cardiac concerns or symptoms  Goals Unmet:  Not Applicable  Comments: Pt able to follow exercise prescription today without complaint.  Will continue to monitor for progression.    Dr. Emily Filbert is Medical Director for [name]monary [facility].

## 2020-07-08 ENCOUNTER — Encounter: Payer: Medicare Other | Admitting: *Deleted

## 2020-07-08 ENCOUNTER — Other Ambulatory Visit: Payer: Self-pay

## 2020-07-08 DIAGNOSIS — J4550 Severe persistent asthma, uncomplicated: Secondary | ICD-10-CM

## 2020-07-08 NOTE — Progress Notes (Signed)
Daily Session Note  Patient Details  Name: Sharon Cervantes MRN: [id number] Date of Birth: 03-Nov-1950 Referring Provider:     Pulmonary Rehab from 04/29/2020 in [facility] - Inpatient Cardiac and Pulmonary Rehab  Referring Provider Claudette Stapler MD      Encounter Date: 07/08/2020  Check In:  Session Check In - 07/08/20 0839      Check-In   Supervising physician immediately available to respond to emergencies See telemetry face sheet for immediately available ER MD    Location [facility]-Cardiac & Pulmonary Rehab    Staff Present Heath Lark, RN, BSN, Jacklynn Bue, MS Exercise Physiologist;Amanda Oletta Darter, [place], ACSM CEP, Exercise Physiologist    Virtual Visit No    Medication changes reported     No    Fall or balance concerns reported    No    Warm-up and Cool-down Performed on first and last piece of equipment    Resistance Training Performed Yes    VAD Patient? No    PAD/SET Patient? No      Pain Assessment   Currently in Pain? No/denies              Social History   Tobacco Use  Smoking Status Never Smoker  Smokeless Tobacco Never Used    Goals Met:  Proper associated with RPD/PD & O2 Sat Independence with exercise equipment Exercise tolerated well No report of cardiac concerns or symptoms  Goals Unmet:  Not Applicable  Comments: Pt able to follow exercise prescription today without complaint.  Will continue to monitor for progression.    Dr. Emily Filbert is Medical Director for [name].

## 2020-07-10 ENCOUNTER — Other Ambulatory Visit: Payer: Self-pay

## 2020-07-10 DIAGNOSIS — J4550 Severe persistent asthma, uncomplicated: Secondary | ICD-10-CM

## 2020-07-10 DIAGNOSIS — I272 Pulmonary hypertension, unspecified: Secondary | ICD-10-CM

## 2020-07-10 NOTE — Progress Notes (Signed)
Daily Session Note  Patient Details  Name: Sharon Cervantes MRN: [id number] Date of Birth: May 06, 1951 Referring Provider:     Pulmonary Rehab from 04/29/2020 in [facility] Cardiac and Pulmonary Rehab  Referring Provider Claudette Stapler MD      Encounter Date: 07/10/2020  Check In:  Session Check In - 07/10/20 0756      Check-In   Supervising physician immediately available to respond to emergencies See telemetry face sheet for immediately available ER MD    Location [facility]-Cardiac & Pulmonary Rehab    Staff Present Birdie Sons, MPA, RN;Amanda Sommer, BA, ACSM CEP, Exercise Physiologist;Jessica Luan Pulling, MA, RCEP, CCRP, CCET    Virtual Visit No    Medication changes reported     Yes    Comments started 63m Prozac    Fall or balance concerns reported    No    Tobacco Cessation Use Decreased   patient states she does not and has not smoked   Current number of cigarettes/nicotine per day     0    Warm-up and Cool-down Performed on first and last piece of equipment    Resistance Training Performed Yes    VAD Patient? No    PAD/SET Patient? No      Pain Assessment   Currently in Pain? No/denies              Social History   Tobacco Use  Smoking Status Never Smoker  Smokeless Tobacco Never Used    Goals Met:  Independence with exercise equipment Exercise tolerated well No report of cardiac concerns or symptoms Strength training completed today  Goals Unmet:  Not Applicable  Comments: Pt able to follow exercise prescription today without complaint.  Will continue to monitor for progression.    Dr. MEmily Filbertis Medical Director for [name].

## 2020-07-16 ENCOUNTER — Emergency Department
Admission: EM | Admit: 2020-07-16 | Discharge: 2020-07-16 | Disposition: A | Discharge status: Home / Self Care | Payer: Medicare Other | Attending: Emergency Medicine | Admitting: Emergency Medicine

## 2020-07-16 ENCOUNTER — Emergency Department: Payer: Medicare Other

## 2020-07-16 ENCOUNTER — Other Ambulatory Visit: Payer: Self-pay

## 2020-07-16 DIAGNOSIS — K429 Umbilical hernia without obstruction or gangrene: Secondary | ICD-10-CM

## 2020-07-16 DIAGNOSIS — Z7951 Long term (current) use of inhaled steroids: Secondary | ICD-10-CM | POA: Insufficient documentation

## 2020-07-16 DIAGNOSIS — R1033 Periumbilical pain: Secondary | ICD-10-CM

## 2020-07-16 DIAGNOSIS — K219 Gastro-esophageal reflux disease without esophagitis: Secondary | ICD-10-CM | POA: Insufficient documentation

## 2020-07-16 DIAGNOSIS — Z96651 Presence of right artificial knee joint: Secondary | ICD-10-CM | POA: Diagnosis not present

## 2020-07-16 DIAGNOSIS — I10 Essential (primary) hypertension: Secondary | ICD-10-CM | POA: Diagnosis not present

## 2020-07-16 DIAGNOSIS — Z79899 Other long term (current) drug therapy: Secondary | ICD-10-CM | POA: Diagnosis not present

## 2020-07-16 DIAGNOSIS — J45909 Unspecified asthma, uncomplicated: Secondary | ICD-10-CM | POA: Insufficient documentation

## 2020-07-16 LAB — COMPREHENSIVE METABOLIC PANEL
ALT: 13 U/L (ref 0–44)
AST: 19 U/L (ref 15–41)
Albumin: 4 g/dL (ref 3.5–5.0)
Alkaline Phosphatase: 107 U/L (ref 38–126)
Anion gap: 10 (ref 5–15)
BUN: 22 mg/dL (ref 8–23)
CO2: 27 mmol/L (ref 22–32)
Calcium: 9.5 mg/dL (ref 8.9–10.3)
Chloride: 102 mmol/L (ref 98–111)
Creatinine, Ser: 1.01 mg/dL — ABNORMAL HIGH (ref 0.44–1.00)
GFR, Estimated: 60 mL/min — ABNORMAL LOW (ref >60)
Glucose, Bld: 111 mg/dL — ABNORMAL HIGH (ref 70–99)
Potassium: 3.9 mmol/L (ref 3.5–5.1)
Sodium: 139 mmol/L (ref 135–145)
Total Bilirubin: 0.6 mg/dL (ref 0.3–1.2)
Total Protein: 7.5 g/dL (ref 6.5–8.1)

## 2020-07-16 LAB — CBC
HCT: 39.2 % (ref 36.0–46.0)
Hemoglobin: 12.7 g/dL (ref 12.0–15.0)
MCH: 27.5 pg (ref 26.0–34.0)
MCHC: 32.4 g/dL (ref 30.0–36.0)
MCV: 84.8 fL (ref 80.0–100.0)
Platelets: 212 10*3/uL (ref 150–400)
RBC: 4.62 MIL/uL (ref 3.87–5.11)
RDW: 13.9 % (ref 11.5–15.5)
WBC: 7.3 10*3/uL (ref 4.0–10.5)
nRBC: 0 % (ref 0.0–0.2)

## 2020-07-16 LAB — LIPASE, BLOOD: Lipase: 19 U/L (ref 11–51)

## 2020-07-16 MED ORDER — IOHEXOL 300 MG/ML  SOLN
100.0000 mL | Freq: Once | INTRAMUSCULAR | Status: AC | PRN
  Administered 2020-07-16: 100 mL via INTRAVENOUS

## 2020-07-16 MED ORDER — ONDANSETRON HCL 4 MG/2ML IJ SOLN
4.0000 mg | Freq: Once | INTRAMUSCULAR | Status: AC
  Administered 2020-07-16: 4 mg via INTRAVENOUS
  Filled 2020-07-16 (×2): qty 2

## 2020-07-16 MED ORDER — IOHEXOL 9 MG/ML PO SOLN: 500.0000 mL | Freq: Once | ORAL | Status: DC | PRN

## 2020-07-16 MED ORDER — SODIUM CHLORIDE 0.9 % IV BOLUS
1000.0000 mL | Freq: Once | INTRAVENOUS | Status: AC
  Administered 2020-07-16: 1000 mL via INTRAVENOUS

## 2020-07-16 MED ORDER — LIDOCAINE 4 % EX PTCH: 1.0000 | MEDICATED_PATCH | Freq: Three times a day (TID) | CUTANEOUS | 0 refills | Status: AC

## 2020-07-16 MED ORDER — HYDROCODONE-ACETAMINOPHEN 5-325 MG PO TABS
1.0000 | ORAL_TABLET | Freq: Once | ORAL | Status: AC
  Administered 2020-07-16: 1 via ORAL
  Filled 2020-07-16: qty 1

## 2020-07-16 NOTE — ED Notes (Signed)
Signature pad unresponsive. Pt verbally acknowledges d/c instructions, no further questions at this time. Pt instructed not to drive for 6-8 hrs after norco

## 2020-07-16 NOTE — ED Triage Notes (Signed)
Hernia to center of abdomen causing her severe pain. Pt has had emesis this AM, unsure if from pain or not.

## 2020-07-16 NOTE — ED Notes (Signed)
Pt does not want zofran right now. States not nauseous. Pt given call bell to light RN know if she starts to get nauseous.

## 2020-07-16 NOTE — ED Notes (Signed)
Rainbow sent to the lab.  

## 2020-07-16 NOTE — ED Provider Notes (Signed)
[facility] Emergency Department Provider Note   ____________________________________________   First MD Initiated Contact with Patient 07/16/20 1030     (approximate)  I have reviewed the triage vital signs and the nursing notes.   HISTORY  Chief Complaint Hernia    HPI Sharon Cervantes is a 69 y.o. female with a stated past medical history of an umbilical hernia who presents for periumbilical pain that began approximately 24 hours prior to arrival and has been worsening since onset.  Patient describes an aching/burning, 8/10, periumbilical pain that has no exacerbating or relieving factors.  Patient states that she is attempted to use ibuprofen for this pain with no relief.  Patient denies any vomiting, diarrhea, chest pain, shortness of breath, fevers, or weakness/numbness/paresthesias in any extremity.         Past Medical History:  Diagnosis Date  . Arthritis   . Asthma   . Chronic cough   . Colon polyps   . Depression   . GERD (gastroesophageal reflux disease)   . Hiatal hernia    LARGE  . History of chicken pox   . Hypertension   . Macular degeneration   . Multinodular goiter    FNA in past neg h/o thyroid cysts  . Pernicious anemia    PERNICIOUS   . RLS (restless legs syndrome)   . Sleep apnea    NO CPAP    Patient Active Problem List   Diagnosis Date Noted  . Status post total knee replacement using cement, right 02/05/2020  . Chronic cough 02/02/2019  . Mitral valve regurgitation 11/19/2018  . Cardiac murmur 11/19/2018  . Vitamin D deficiency 10/12/2018  . Essential hypertension 10/12/2018  . Pernicious anemia 10/12/2018  . Elevated alkaline phosphatase level 10/12/2018  . Hypercalcemia 10/12/2018  . Asthma 10/12/2018  . RLS (restless legs syndrome) 10/12/2018  . Hyperlipidemia 10/12/2018    Past Surgical History:  Procedure Laterality Date  . APPLICATION OF WOUND VAC Right 02/05/2020   Procedure: APPLICATION OF WOUND VAC;   Surgeon: Kennedy BuckerMenz, Michael, MD;  Location: [facility] ORS;  Service: Orthopedics;  Laterality: Right;  [id number]  . hiatal hernia surgery     2018  . NASAL SINUS SURGERY     2018  . THYROID SURGERY     cyst removal  . TONSILLECTOMY     1961  . TOTAL KNEE ARTHROPLASTY Right 02/05/2020   Procedure: RIGHT TOTAL KNEE ARTHROPLASTY;  Surgeon: Kennedy BuckerMenz, Michael, MD;  Location: [facility] ORS;  Service: Orthopedics;  Laterality: Right;  . TUBAL LIGATION      Prior to Admission medications   Medication Sig Start Date End Date Taking? Authorizing Provider  acetaminophen (TYLENOL) 500 MG tablet Take 1-2 tablets (500-1,000 mg total) by mouth every 6 (six) hours as needed for mild pain (pain score 1-3 or temp > 100.5). 02/07/20   Evon SlackGaines, Thomas C, PA-C  albuterol (PROVENTIL HFA;VENTOLIN HFA) 108 (90 Base) MCG/ACT inhaler Inhale 1-2 puffs into the lungs every 6 (six) hours as needed for wheezing or shortness of breath.     [provider]  budesonide (PULMICORT) 0.5 MG/2ML nebulizer solution Take 0.5 mg by nebulization every 6 (six) hours as needed (shortness of breath or wheezing).  01/10/19 02/06/20  [provider]  budesonide (PULMICORT) 0.5 MG/2ML nebulizer solution Inhale into the lungs. 01/10/19   [provider]  cyanocobalamin (,VITAMIN B-12,) 1000 MCG/ML injection Inject 1,000 mcg into the muscle every 30 (thirty) days.    [provider]  docusate sodium (  COLACE) 100 MG capsule Take 1 capsule (100 mg total) by mouth 2 (two) times daily. 02/07/20   Evon Slack, PA-C  Dupilumab (DUPIXENT) 300 MG/2ML SOPN Inject 300 mg into the skin every 14 (fourteen) days. JUST HAD DOSE TODAY 6-3 Patient not taking: Reported on 04/29/2020 10/05/19   [provider]  enoxaparin (LOVENOX) 40 MG/0.4ML injection Inject 0.4 mLs (40 mg total) into the skin daily for 14 days. 02/07/20 02/21/20  Evon Slack, PA-C  esomeprazole (NEXIUM) 40 MG capsule Take by mouth. 03/12/20 04/28/21  [provider]  fluticasone (FLONASE) 50 MCG/ACT nasal spray Place into the nose. 10/23/18 02/06/20  [provider]  fluticasone (FLOVENT HFA) 220 MCG/ACT inhaler Inhale into the lungs. 10/23/18   [provider]  gabapentin (NEURONTIN) 300 MG capsule Take by mouth. 11/15/19 04/10/21  [provider]  hydrochlorothiazide (HYDRODIURIL) 12.5 MG tablet Take 12.5 mg by mouth daily.  01/31/19 01/31/20  [provider]  hydrochlorothiazide (HYDRODIURIL) 12.5 MG tablet Take by mouth. 12/26/19   [provider]  Lidocaine (HM LIDOCAINE PATCH) 4 % PTCH Apply 1 patch topically every 8 (eight) hours for 3 days. 07/16/20 07/19/20  Merwyn Katos, MD  methocarbamol (ROBAXIN) 500 MG tablet Take 1 tablet (500 mg total) by mouth every 6 (six) hours as needed for muscle spasms. 02/07/20   Evon Slack, PA-C  montelukast (SINGULAIR) 10 MG tablet Take 1 tablet (10 mg total) by mouth at bedtime. appt further refills Patient taking differently: Take 10 mg by mouth at bedtime.  10/21/19   McLean-Scocuzza, Pasty Spillers, MD  montelukast (SINGULAIR) 10 MG tablet Take by mouth. 04/10/20 04/10/21  [provider]  omeprazole (PRILOSEC) 40 MG capsule Take 40 mg by mouth at bedtime.    [provider]  oxyCODONE (OXY IR/ROXICODONE) 5 MG immediate release tablet Take 1-2 tablets (5-10 mg total) by mouth every 4 (four) hours as needed for moderate pain (pain score 4-6). Patient not taking: Reported on 04/29/2020 02/07/20   Evon Slack, PA-C  pramipexole (MIRAPEX) 1.5 MG tablet Take 1.5 mg by mouth at bedtime.     [provider]  venlafaxine XR (EFFEXOR-XR) 150 MG 24 hr capsule Take 1 capsule (150 mg total) by mouth daily with breakfast. Patient not taking: Reported on 04/29/2020 01/08/19   McLean-Scocuzza, Pasty Spillers, MD  venlafaxine XR (EFFEXOR-XR) 75 MG 24 hr capsule Take 225 mg by mouth daily with breakfast.    [provider]    Allergies Patient has no  known allergies.  Family History  Problem Relation Age of Onset  . Heart disease Mother   . Heart disease Father   . Hypertension Father   . Depression Father   . Alcohol abuse Father   . Cancer Maternal Grandmother        uterine, skin   . Arthritis Maternal Grandmother     Social History Social History   Tobacco Use  . Smoking status: Never Smoker  . Smokeless tobacco: Never Used  Vaping Use  . Vaping Use: Never used  Substance Use Topics  . Alcohol use: Not Currently  . Drug use: Not Currently    Review of Systems Constitutional: No fever/chills Eyes: No visual changes. ENT: No sore throat. Cardiovascular: Denies chest pain. Respiratory: Denies shortness of breath. Gastrointestinal: Positive for abdominal pain.  No nausea, no vomiting.  No diarrhea. Genitourinary: Negative for dysuria. Musculoskeletal: Negative for acute arthralgias Skin: Negative for rash. Neurological: Negative for headaches, weakness/numbness/paresthesias in  any extremity Psychiatric: Negative for suicidal ideation/homicidal ideation   ____________________________________________   PHYSICAL EXAM:  VITAL SIGNS: ED Triage Vitals  Enc Vitals Group     BP 07/16/20 1021 (!) 157/87     Pulse Rate 07/16/20 1021 86     Resp 07/16/20 1021 20     Temp 07/16/20 1021 97.7 F (36.5 C)     Temp Source 07/16/20 1021 Oral     SpO2 07/16/20 1021 98 %     Weight 07/16/20 1021 250 lb (113.4 kg)     Height 07/16/20 1021 5\' 5"  (1.651 m)     Head Circumference --      Peak Flow --      Pain Score 07/16/20 1026 9     Pain Loc --      Pain Edu? --      Excl. in GC? --    Constitutional: Alert and oriented. Well appearing and in no acute distress. Eyes: Conjunctivae are normal. PERRL. Head: Atraumatic. Nose: No congestion/rhinnorhea. Mouth/Throat: Mucous membranes are moist. Neck: No stridor Cardiovascular: Grossly normal heart sounds.  Good peripheral circulation. Respiratory: Normal respiratory  effort.  No retractions. Gastrointestinal: Soft.  Mild tenderness palpation of a palpable mass in the periumbilical region with all contents of this hernia are easily reducible. No distention. Musculoskeletal: No obvious deformities Neurologic:  Normal speech and language. No gross focal neurologic deficits are appreciated. Skin:  Skin is warm and dry. No rash noted. Psychiatric: Mood and affect are normal. Speech and behavior are normal.  ____________________________________________   LABS (all labs ordered are listed, but only abnormal results are displayed)  Labs Reviewed  COMPREHENSIVE METABOLIC PANEL - Abnormal; Notable for the following components:      Result Value   Glucose, Bld 111 (*)    Creatinine, Ser 1.01 (*)    GFR, Estimated 60 (*)    All other components within normal limits  LIPASE, BLOOD  CBC  URINALYSIS, COMPLETE (UACMP) WITH MICROSCOPIC    RADIOLOGY  ED MD interpretation: CT of the abdomen and pelvis with IV and oral contrast shows a fat-containing ventral abdominal wall hernia above the umbilicus with mild infiltration around the fat that may suggest strangulation of this fatty tissue.  No incarcerated/strangulated abdominal contents, no significant free fluid or free air  Official radiology report(s): CT Abdomen Pelvis W Contrast  Result Date: 07/16/2020 CLINICAL DATA:  Epigastric pain EXAM: CT ABDOMEN AND PELVIS WITH CONTRAST TECHNIQUE: Multidetector CT imaging of the abdomen and pelvis was performed using the standard protocol following bolus administration of intravenous contrast. CONTRAST:  07/18/2020 OMNIPAQUE IOHEXOL 300 MG/ML  SOLN COMPARISON:  None. FINDINGS: Lower chest: No acute abnormality. Hepatobiliary: No focal liver abnormality. Gallbladder is contracted. No biliary dilatation. Pancreas: Unremarkable. Spleen: Unremarkable. Adrenals/Urinary Tract: Adrenals, kidneys, and bladder are unremarkable. Stomach/Bowel: Moderate hiatal hernia.  Bowel is normal in  caliber. Vascular/Lymphatic: No significant vascular abnormality. No enlarged lymph nodes identified. Reproductive: Uterus and bilateral adnexa are unremarkable. Other: No ascites. Ventral abdominal hernia above the umbilicus containing mesenteric fat and vessels. Mild infiltration of the fat. Abdominal wall defect measures 1.3 cm craniocaudally and 1.8 cm transverse. Musculoskeletal: Degenerative changes of the included spine. No acute osseous abnormality. IMPRESSION: Fat containing ventral abdominal wall hernia above the umbilicus. Mild infiltration of the fat may reflect inflammation. Moderate hiatal hernia. Electronically Signed   By: M.D.   On: 07/16/2020 11:40    ____________________________________________   PROCEDURES  Procedure(s) performed (including Critical Care):  Procedures   ____________________________________________   INITIAL IMPRESSION / ASSESSMENT AND PLAN / ED COURSE  As part of my medical decision making, I reviewed the following data within the electronic MEDICAL RECORD NUMBER Nursing notes reviewed and incorporated, Labs reviewed, Old chart reviewed, Radiograph reviewed and Notes from prior ED visits reviewed and incorporated        Patients symptoms not typical for emergent causes of abdominal pain such as, but not limited to, appendicitis, abdominal aortic aneurysm, surgical biliary disease, pancreatitis, SBO, mesenteric ischemia, serious intra-abdominal bacterial illness. Presentation also not typical of gynecologic emergencies such as TOA, Ovarian Torsion, PID. Not Ectopic. Doubt atypical ACS. Likely due to fat-containing hernia.   Pain well controlled prior to discharge Pt tolerating PO. Disposition: Patient will be discharged with strict return precautions and follow up with primary MD within 12-24 hours for further evaluation. Patient understands that this still may have an early presentation of an emergent medical condition such as appendicitis  that will require a recheck.      ____________________________________________   FINAL CLINICAL IMPRESSION(S) / ED DIAGNOSES  Final diagnoses:  Umbilical hernia without obstruction and without gangrene  Acute periumbilical pain     ED Discharge Orders         Ordered    Lidocaine (HM LIDOCAINE PATCH) 4 % PTCH  Every 8 hours        07/16/20 1156           Note:  This document was prepared using Dragon voice recognition software and may include unintentional dictation errors.   Merwyn Katos, MD 07/16/20 1200

## 2020-07-22 ENCOUNTER — Telehealth: Payer: Self-pay | Admitting: *Deleted

## 2020-07-22 ENCOUNTER — Telehealth: Payer: Self-pay

## 2020-07-22 ENCOUNTER — Ambulatory Visit: Payer: Self-pay | Admitting: Surgery

## 2020-07-22 ENCOUNTER — Other Ambulatory Visit
Admission: RE | Admit: 2020-07-22 | Discharge: 2020-07-22 | Disposition: A | Discharge status: Home / Self Care | Payer: Medicare Other | Source: Ambulatory Visit | Attending: Surgery | Admitting: Surgery

## 2020-07-22 ENCOUNTER — Other Ambulatory Visit: Payer: Self-pay

## 2020-07-22 DIAGNOSIS — Z20822 Contact with and (suspected) exposure to covid-19: Secondary | ICD-10-CM | POA: Insufficient documentation

## 2020-07-22 DIAGNOSIS — Z01812 Encounter for preprocedural laboratory examination: Secondary | ICD-10-CM | POA: Diagnosis present

## 2020-07-22 NOTE — Telephone Encounter (Signed)
Pt out still and did not call today.  Called to check on pt.  Left message.

## 2020-07-22 NOTE — H&P (Signed)
Subjective:   CC: Incisional hernia with obstruction but no gangrene [K43.0]  HPI:  Sharon Cervantes is a 69 y.o. female who was referred by Enid Baas, MD for evaluation of above. Symptoms were first noted 4 months ago. Pain is sharp, confined to the epigastric area, without radiation.  No N/V, change in BM.  exacerbated by bending over.  Lump is not reducible.    Past Medical History:  has a past medical history of Allergy, Anemia (1996), Arthritis, Asthma, unspecified asthma severity, unspecified whether complicated, unspecified whether persistent, Depression, GERD (gastroesophageal reflux disease) (2016), Heart murmur, unspecified, History of cataract (2029), Hyperlipidemia, Hypertension, Sleep apnea (2020), and Vitamin D deficiency.  Past Surgical History:  Past Surgical History:  Procedure Laterality Date  . ARTHROPLASTY TOTAL KNEE Right 02/05/2020   Dr. Rosita Kea  . HERNIA REPAIR  Hiatal hernia, 12/18  . JOINT REPLACEMENT  02/05/2020   Right knee  . KNEE ARTHROSCOPY  Torn meniscus repair, 2017  . REPAIR HIATAL HERNIA  06/2017  . 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 Right knee arthroscopy Right 2018  . TONSILLECTOMY    . TUBAL LIGATION  1980    Family History: family history includes Angina in her mother; Asthma in her paternal uncle; Heart disease in her father and mother; High blood pressure (Hypertension) in her father; Kidney disease in her father; No Known Problems in her brother and sister; Stroke in her father.  Social History:  reports that she has never smoked. She has never used smokeless tobacco. She reports that she does not drink alcohol and does not use drugs.  Current Medications: has a current medication list which includes the following prescription(s): albuterol, albuterol, budesonide, cyanocobalamin, diclofenac, fluoxetine, fluticasone propionate, fluticasone propionate, gabapentin, hydrochlorothiazide, ipratropium,  meloxicam, montelukast, omeprazole, pramipexole, syringe with needle, tramadol, trazodone, and venlafaxine.  Allergies:  Allergies as of 07/22/2020  . (No Known Allergies)    ROS:  A 15 point review of systems was performed and pertinent positives and negatives noted in HPI   Objective:     BP 140/88   Pulse 83   Ht 165.1 cm (5\' 5" )   Wt (!) 111.9 kg (246 lb 9.6 oz)   LMP  (LMP Unknown)   BMI 41.04 kg/m   Constitutional :  alert, appears stated age, cooperative and no distress  Lymphatics/Throat:  no asymmetry, masses, or scars  Respiratory:  clear to auscultation bilaterally  Cardiovascular:  regular rate and rhythm  Gastrointestinal: soft, non-tender; bowel sounds normal; no masses,  no organomegaly. incisional hernia noted.  moderate, incarcerated, overlying skin changes and epigastric area, deep to port site scar from previous hiatal hernia surgery.  mild erythema noted on overyling skin  Musculoskeletal: Steady gait and movement  Skin: Cool and moist  Psychiatric: Normal affect, non-agitated, not confused       LABS:  n/a   RADS: CLINICAL DATA: Epigastric pain   EXAM:  CT ABDOMEN AND PELVIS WITH CONTRAST   TECHNIQUE:  Multidetector CT imaging of the abdomen and pelvis was performed  using the standard protocol following bolus administration of  intravenous contrast.   CONTRAST: OMNIPAQUE IOHEXOL 300 MG/ML SOLN   COMPARISON: None.   FINDINGS:  Lower chest: No acute abnormality.   Hepatobiliary: No focal liver abnormality. Gallbladder is  contracted. No biliary dilatation.   Pancreas: Unremarkable.   Spleen: Unremarkable.   Adrenals/Urinary Tract: Adrenals, kidneys, and bladder are  unremarkable.   Stomach/Bowel: Moderate hiatal hernia. Bowel  is normal in caliber.   Vascular/Lymphatic: No significant vascular abnormality. No enlarged  lymph nodes identified.   Reproductive: Uterus and bilateral adnexa are unremarkable.   Other: No  ascites. Ventral abdominal hernia above the umbilicus  containing mesenteric fat and vessels. Mild infiltration of the fat.  Abdominal wall defect measures 1.3 cm craniocaudallyand 1.8 cm  transverse.   Musculoskeletal: Degenerative changes of the included spine. No  acute osseous abnormality.   IMPRESSION:  Fat containing ventral abdominal wall hernia above the umbilicus.  Mild infiltration of the fat may reflect inflammation.   Moderate hiatal hernia.    Electronically Signed  By: Guadlupe Spanish M.D.  On: 07/16/2020 11:40  Assessment:       Incisional hernia with obstruction but no gangrene [K43.0]  Incarcerated, TTP but no sign of bowel involvement, and patient otherwise is asymptomatic, so no need for emergent repair, but recommend repair within next few days  Plan:     1. Incisional hernia with obstruction but no gangrene [K43.0]   Discussed the risk of surgery including recurrence, which can be up to 50% in the case of incisional or complex hernias, possible use of prosthetic materials (mesh) and the increased risk of mesh infxn if used, bleeding, chronic pain, post-op infxn, post-op SBO or ileus, and possible re-operation to address said risks. The risks of general anesthetic, if used, includes MI, CVA, sudden death or even reaction to anesthetic medications also discussed. Alternatives include continued observation.  Benefits include possible symptom relief, prevention of incarceration, strangulation, enlargement in size over time, and the risk of emergency surgery in the face of strangulation.   Typical post-op recovery time of 3-5 days with 2 weeks of activity restrictions were also discussed.  ED return precautions given for sudden increase in pain, size of hernia with accompanying fever, nausea, and/or vomiting.  The patient verbalized understanding and all questions were answered to the patient's satisfaction.   2. Patient has elected to proceed with surgical  treatment. Procedure will be scheduled.  Written consent was obtained. Open approach due to previous lap surgery and size of hernia contents comparted to relative small defect.

## 2020-07-22 NOTE — Telephone Encounter (Signed)
Sharon Cervantes is having surgery tomorrow for a hernia.  She will let us know when she is cleared to return to exercise.

## 2020-07-22 NOTE — H&P (View-Only) (Signed)
Subjective:   CC: Incisional hernia with obstruction but no gangrene [K43.0]  HPI:  Sharon Cervantes is a 69 y.o. female who was referred by Radhika Kalisetti, MD for evaluation of above. Symptoms were first noted 4 months ago. Pain is sharp, confined to the epigastric area, without radiation.  No N/V, change in BM.  exacerbated by bending over.  Lump is not reducible.    Past Medical History:  has a past medical history of Allergy, Anemia (1996), Arthritis, Asthma, unspecified asthma severity, unspecified whether complicated, unspecified whether persistent, Depression, GERD (gastroesophageal reflux disease) (2016), Heart murmur, unspecified, History of cataract (2029), Hyperlipidemia, Hypertension, Sleep apnea (2020), and Vitamin D deficiency.  Past Surgical History:  Past Surgical History:  Procedure Laterality Date  . ARTHROPLASTY TOTAL KNEE Right 02/05/2020   Dr. Menz  . HERNIA REPAIR  Hiatal hernia, 12/18  . JOINT REPLACEMENT  02/05/2020   Right knee  . KNEE ARTHROSCOPY  Torn meniscus repair, 2017  . REPAIR HIATAL HERNIA  06/2017  . 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 Right knee arthroscopy Right 2018  . TONSILLECTOMY    . TUBAL LIGATION  1980    Family History: family history includes Angina in her mother; Asthma in her paternal uncle; Heart disease in her father and mother; High blood pressure (Hypertension) in her father; Kidney disease in her father; No Known Problems in her brother and sister; Stroke in her father.  Social History:  reports that she has never smoked. She has never used smokeless tobacco. She reports that she does not drink alcohol and does not use drugs.  Current Medications: has a current medication list which includes the following prescription(s): albuterol, albuterol, budesonide, cyanocobalamin, diclofenac, fluoxetine, fluticasone propionate, fluticasone propionate, gabapentin, hydrochlorothiazide, ipratropium,  meloxicam, montelukast, omeprazole, pramipexole, syringe with needle, tramadol, trazodone, and venlafaxine.  Allergies:  Allergies as of 07/22/2020  . (No Known Allergies)    ROS:  A 15 point review of systems was performed and pertinent positives and negatives noted in HPI   Objective:     BP 140/88   Pulse 83   Ht 165.1 cm (5' 5")   Wt (!) 111.9 kg (246 lb 9.6 oz)   LMP  (LMP Unknown)   BMI 41.04 kg/m   Constitutional :  alert, appears stated age, cooperative and no distress  Lymphatics/Throat:  no asymmetry, masses, or scars  Respiratory:  clear to auscultation bilaterally  Cardiovascular:  regular rate and rhythm  Gastrointestinal: soft, non-tender; bowel sounds normal; no masses,  no organomegaly. incisional hernia noted.  moderate, incarcerated, overlying skin changes and epigastric area, deep to port site scar from previous hiatal hernia surgery.  mild erythema noted on overyling skin  Musculoskeletal: Steady gait and movement  Skin: Cool and moist  Psychiatric: Normal affect, non-agitated, not confused       LABS:  n/a   RADS: CLINICAL DATA: Epigastric pain   EXAM:  CT ABDOMEN AND PELVIS WITH CONTRAST   TECHNIQUE:  Multidetector CT imaging of the abdomen and pelvis was performed  using the standard protocol following bolus administration of  intravenous contrast.   CONTRAST: 100mL OMNIPAQUE IOHEXOL 300 MG/ML SOLN   COMPARISON: None.   FINDINGS:  Lower chest: No acute abnormality.   Hepatobiliary: No focal liver abnormality. Gallbladder is  contracted. No biliary dilatation.   Pancreas: Unremarkable.   Spleen: Unremarkable.   Adrenals/Urinary Tract: Adrenals, kidneys, and bladder are  unremarkable.   Stomach/Bowel: Moderate hiatal hernia. Bowel   is normal in caliber.   Vascular/Lymphatic: No significant vascular abnormality. No enlarged  lymph nodes identified.   Reproductive: Uterus and bilateral adnexa are unremarkable.   Other: No  ascites. Ventral abdominal hernia above the umbilicus  containing mesenteric fat and vessels. Mild infiltration of the fat.  Abdominal wall defect measures 1.3 cm craniocaudallyand 1.8 cm  transverse.   Musculoskeletal: Degenerative changes of the included spine. No  acute osseous abnormality.   IMPRESSION:  Fat containing ventral abdominal wall hernia above the umbilicus.  Mild infiltration of the fat may reflect inflammation.   Moderate hiatal hernia.    Electronically Signed  By: Guadlupe Spanish M.D.  On: 07/16/2020 11:40  Assessment:       Incisional hernia with obstruction but no gangrene [K43.0]  Incarcerated, TTP but no sign of bowel involvement, and patient otherwise is asymptomatic, so no need for emergent repair, but recommend repair within next few days  Plan:     1. Incisional hernia with obstruction but no gangrene [K43.0]   Discussed the risk of surgery including recurrence, which can be up to 50% in the case of incisional or complex hernias, possible use of prosthetic materials (mesh) and the increased risk of mesh infxn if used, bleeding, chronic pain, post-op infxn, post-op SBO or ileus, and possible re-operation to address said risks. The risks of general anesthetic, if used, includes MI, CVA, sudden death or even reaction to anesthetic medications also discussed. Alternatives include continued observation.  Benefits include possible symptom relief, prevention of incarceration, strangulation, enlargement in size over time, and the risk of emergency surgery in the face of strangulation.   Typical post-op recovery time of 3-5 days with 2 weeks of activity restrictions were also discussed.  ED return precautions given for sudden increase in pain, size of hernia with accompanying fever, nausea, and/or vomiting.  The patient verbalized understanding and all questions were answered to the patient's satisfaction.   2. Patient has elected to proceed with surgical  treatment. Procedure will be scheduled.  Written consent was obtained. Open approach due to previous lap surgery and size of hernia contents comparted to relative small defect.

## 2020-07-23 ENCOUNTER — Encounter: Payer: Self-pay | Admitting: Surgery

## 2020-07-23 ENCOUNTER — Encounter
Admission: RE | Disposition: A | Discharge status: Home / Self Care | Payer: Self-pay | Source: Home / Self Care | Attending: Surgery

## 2020-07-23 ENCOUNTER — Ambulatory Visit
Admission: RE | Admit: 2020-07-23 | Discharge: 2020-07-23 | Disposition: A | Discharge status: Home / Self Care | Payer: Medicare Other | Attending: Surgery | Admitting: Surgery

## 2020-07-23 ENCOUNTER — Ambulatory Visit: Payer: Medicare Other | Admitting: Anesthesiology

## 2020-07-23 ENCOUNTER — Other Ambulatory Visit: Payer: Self-pay

## 2020-07-23 DIAGNOSIS — Z96651 Presence of right artificial knee joint: Secondary | ICD-10-CM | POA: Diagnosis not present

## 2020-07-23 DIAGNOSIS — K431 Incisional hernia with gangrene: Secondary | ICD-10-CM

## 2020-07-23 DIAGNOSIS — K430 Incisional hernia with obstruction, without gangrene: Secondary | ICD-10-CM | POA: Insufficient documentation

## 2020-07-23 DIAGNOSIS — Z8719 Personal history of other diseases of the digestive system: Secondary | ICD-10-CM | POA: Diagnosis not present

## 2020-07-23 HISTORY — PX: VENTRAL HERNIA REPAIR: SHX424

## 2020-07-23 LAB — SARS CORONAVIRUS 2 (TAT 6-24 HRS): SARS Coronavirus 2: NEGATIVE

## 2020-07-23 SURGERY — REPAIR, HERNIA, VENTRAL
Anesthesia: General | Site: Abdomen

## 2020-07-23 MED ORDER — ORAL CARE MOUTH RINSE: 15.0000 mL | Freq: Once | OROMUCOSAL | Status: AC

## 2020-07-23 MED ORDER — ROCURONIUM BROMIDE 100 MG/10ML IV SOLN
INTRAVENOUS | Status: DC | PRN
  Administered 2020-07-23 (×2): 20 mg via INTRAVENOUS
  Administered 2020-07-23: 50 mg via INTRAVENOUS

## 2020-07-23 MED ORDER — ONDANSETRON HCL 4 MG/2ML IJ SOLN: 4.0000 mg | Freq: Once | INTRAMUSCULAR | Status: DC | PRN

## 2020-07-23 MED ORDER — ONDANSETRON HCL 4 MG/2ML IJ SOLN
INTRAMUSCULAR | Status: AC
  Filled 2020-07-23: qty 2

## 2020-07-23 MED ORDER — BUPIVACAINE HCL 0.5 % IJ SOLN
INTRAMUSCULAR | Status: DC | PRN
  Administered 2020-07-23: 30 mL

## 2020-07-23 MED ORDER — CEFAZOLIN SODIUM-DEXTROSE 2-4 GM/100ML-% IV SOLN
2.0000 g | INTRAVENOUS | Status: AC
  Administered 2020-07-23: 2 g via INTRAVENOUS

## 2020-07-23 MED ORDER — HYDROCODONE-ACETAMINOPHEN 5-325 MG PO TABS: 1.0000 | ORAL_TABLET | Freq: Four times a day (QID) | ORAL | 0 refills | Status: DC | PRN

## 2020-07-23 MED ORDER — DEXAMETHASONE SODIUM PHOSPHATE 10 MG/ML IJ SOLN
INTRAMUSCULAR | Status: DC | PRN
  Administered 2020-07-23: 10 mg via INTRAVENOUS

## 2020-07-23 MED ORDER — PROPOFOL 10 MG/ML IV BOLUS
INTRAVENOUS | Status: AC
  Filled 2020-07-23: qty 20

## 2020-07-23 MED ORDER — SEVOFLURANE IN SOLN
RESPIRATORY_TRACT | Status: AC
  Filled 2020-07-23: qty 250

## 2020-07-23 MED ORDER — CELECOXIB 200 MG PO CAPS: 200.0000 mg | ORAL_CAPSULE | ORAL | Status: AC

## 2020-07-23 MED ORDER — ACETAMINOPHEN 500 MG PO TABS
ORAL_TABLET | ORAL | Status: AC
  Administered 2020-07-23: 1000 mg via ORAL
  Filled 2020-07-23: qty 2

## 2020-07-23 MED ORDER — CHLORHEXIDINE GLUCONATE 0.12 % MT SOLN
OROMUCOSAL | Status: AC
  Administered 2020-07-23: 15 mL via OROMUCOSAL
  Filled 2020-07-23: qty 15

## 2020-07-23 MED ORDER — ACETAMINOPHEN 500 MG PO TABS: 1000.0000 mg | ORAL_TABLET | ORAL | Status: AC

## 2020-07-23 MED ORDER — FENTANYL CITRATE (PF) 100 MCG/2ML IJ SOLN
INTRAMUSCULAR | Status: DC | PRN
  Administered 2020-07-23 (×2): 50 ug via INTRAVENOUS

## 2020-07-23 MED ORDER — FENTANYL CITRATE (PF) 100 MCG/2ML IJ SOLN: 25.0000 ug | INTRAMUSCULAR | Status: DC | PRN

## 2020-07-23 MED ORDER — BUPIVACAINE LIPOSOME 1.3 % IJ SUSP
INTRAMUSCULAR | Status: DC | PRN
  Administered 2020-07-23: 20 mL

## 2020-07-23 MED ORDER — FENTANYL CITRATE (PF) 100 MCG/2ML IJ SOLN
INTRAMUSCULAR | Status: AC
  Filled 2020-07-23: qty 2

## 2020-07-23 MED ORDER — CHLORHEXIDINE GLUCONATE CLOTH 2 % EX PADS: 6.0000 | MEDICATED_PAD | Freq: Once | CUTANEOUS | Status: DC

## 2020-07-23 MED ORDER — GLYCOPYRROLATE 0.2 MG/ML IJ SOLN
INTRAMUSCULAR | Status: AC
  Filled 2020-07-23: qty 1

## 2020-07-23 MED ORDER — PROPOFOL 10 MG/ML IV BOLUS
INTRAVENOUS | Status: DC | PRN
  Administered 2020-07-23: 30 mg via INTRAVENOUS
  Administered 2020-07-23: 150 mg via INTRAVENOUS

## 2020-07-23 MED ORDER — CHLORHEXIDINE GLUCONATE 0.12 % MT SOLN: 15.0000 mL | Freq: Once | OROMUCOSAL | Status: AC

## 2020-07-23 MED ORDER — LIDOCAINE HCL (CARDIAC) PF 100 MG/5ML IV SOSY
PREFILLED_SYRINGE | INTRAVENOUS | Status: DC | PRN
  Administered 2020-07-23 (×2): 100 mg via INTRAVENOUS

## 2020-07-23 MED ORDER — DEXAMETHASONE SODIUM PHOSPHATE 10 MG/ML IJ SOLN
INTRAMUSCULAR | Status: AC
  Filled 2020-07-23: qty 1

## 2020-07-23 MED ORDER — ONDANSETRON HCL 4 MG/2ML IJ SOLN
INTRAMUSCULAR | Status: DC | PRN
  Administered 2020-07-23: 4 mg via INTRAVENOUS

## 2020-07-23 MED ORDER — SUGAMMADEX SODIUM 500 MG/5ML IV SOLN
INTRAVENOUS | Status: DC | PRN
  Administered 2020-07-23: 453.6 mg via INTRAVENOUS

## 2020-07-23 MED ORDER — CELECOXIB 200 MG PO CAPS
ORAL_CAPSULE | ORAL | Status: AC
  Administered 2020-07-23: 200 mg via ORAL
  Filled 2020-07-23: qty 1

## 2020-07-23 MED ORDER — BUPIVACAINE HCL (PF) 0.5 % IJ SOLN
INTRAMUSCULAR | Status: AC
  Filled 2020-07-23: qty 30

## 2020-07-23 MED ORDER — ROCURONIUM BROMIDE 10 MG/ML (PF) SYRINGE
PREFILLED_SYRINGE | INTRAVENOUS | Status: AC
  Filled 2020-07-23: qty 10

## 2020-07-23 MED ORDER — ACETAMINOPHEN 325 MG PO TABS: 650.0000 mg | ORAL_TABLET | Freq: Three times a day (TID) | ORAL | 0 refills | Status: AC | PRN

## 2020-07-23 MED ORDER — ALBUTEROL SULFATE HFA 108 (90 BASE) MCG/ACT IN AERS
INHALATION_SPRAY | RESPIRATORY_TRACT | Status: AC
  Filled 2020-07-23: qty 6.7

## 2020-07-23 MED ORDER — OXYCODONE HCL 5 MG PO TABS: 5.0000 mg | ORAL_TABLET | Freq: Once | ORAL | Status: DC | PRN

## 2020-07-23 MED ORDER — LIDOCAINE HCL (PF) 2 % IJ SOLN
INTRAMUSCULAR | Status: AC
  Filled 2020-07-23: qty 5

## 2020-07-23 MED ORDER — LACTATED RINGERS IV SOLN: INTRAVENOUS | Status: DC

## 2020-07-23 MED ORDER — DOCUSATE SODIUM 100 MG PO CAPS: 100.0000 mg | ORAL_CAPSULE | Freq: Two times a day (BID) | ORAL | 0 refills | Status: AC | PRN

## 2020-07-23 MED ORDER — ALBUTEROL SULFATE HFA 108 (90 BASE) MCG/ACT IN AERS
INHALATION_SPRAY | RESPIRATORY_TRACT | Status: DC | PRN
  Administered 2020-07-23: 2 via RESPIRATORY_TRACT

## 2020-07-23 MED ORDER — BUPIVACAINE LIPOSOME 1.3 % IJ SUSP
INTRAMUSCULAR | Status: AC
  Filled 2020-07-23: qty 20

## 2020-07-23 MED ORDER — CEFAZOLIN SODIUM-DEXTROSE 2-4 GM/100ML-% IV SOLN
INTRAVENOUS | Status: AC
  Filled 2020-07-23: qty 100

## 2020-07-23 MED ORDER — OXYCODONE HCL 5 MG/5ML PO SOLN: 5.0000 mg | Freq: Once | ORAL | Status: DC | PRN

## 2020-07-23 SURGICAL SUPPLY — 36 items
ADH SKN CLS APL DERMABOND .7 (GAUZE/BANDAGES/DRESSINGS) ×1
APL PRP STRL LF DISP 70% ISPRP (MISCELLANEOUS) ×1
BLADE SURG 15 STRL LF DISP TIS (BLADE) ×1 IMPLANT
BLADE SURG 15 STRL SS (BLADE) ×2
CANISTER SUCT 1200ML W/VALVE (MISCELLANEOUS) IMPLANT
CHLORAPREP W/TINT 26 (MISCELLANEOUS) ×2 IMPLANT
COVER WAND RF STERILE (DRAPES) ×2 IMPLANT
DERMABOND ADVANCED (GAUZE/BANDAGES/DRESSINGS) ×1
DERMABOND ADVANCED .7 DNX12 (GAUZE/BANDAGES/DRESSINGS) ×1 IMPLANT
DRAPE LAPAROTOMY 100X77 ABD (DRAPES) ×2 IMPLANT
ELECT CAUTERY BLADE 6.4 (BLADE) ×2 IMPLANT
ELECT REM PT RETURN 9FT ADLT (ELECTROSURGICAL) ×2
ELECTRODE REM PT RTRN 9FT ADLT (ELECTROSURGICAL) ×1 IMPLANT
GLOVE BIOGEL PI IND STRL 7.0 (GLOVE) ×1 IMPLANT
GLOVE BIOGEL PI INDICATOR 7.0 (GLOVE) ×1
GLOVE SURG SYN 6.5 ES PF (GLOVE) ×2 IMPLANT
GOWN STRL REUS W/ TWL LRG LVL3 (GOWN DISPOSABLE) ×2 IMPLANT
GOWN STRL REUS W/TWL LRG LVL3 (GOWN DISPOSABLE) ×4
KIT TURNOVER KIT A (KITS) ×2 IMPLANT
LABEL OR SOLS (LABEL) ×2 IMPLANT
LIGASURE IMPACT 36 18CM CVD LR (INSTRUMENTS) ×2 IMPLANT
MANIFOLD NEPTUNE II (INSTRUMENTS) ×2 IMPLANT
MESH VENTRALEX ST 2.5 CRC MED (Mesh General) ×2 IMPLANT
NEEDLE HYPO 22GX1.5 SAFETY (NEEDLE) ×4 IMPLANT
NS IRRIG 500ML POUR BTL (IV SOLUTION) ×2 IMPLANT
PACK BASIN MINOR (MISCELLANEOUS) ×2 IMPLANT
SUT ETHIBOND 0 MO6 C/R (SUTURE) ×2 IMPLANT
SUT ETHIBOND NAB MO 7 #0 18IN (SUTURE) ×2 IMPLANT
SUT MNCRL 4-0 (SUTURE) ×2
SUT MNCRL 4-0 27XMFL (SUTURE) ×1
SUT VIC AB 3-0 SH 27 (SUTURE) ×4
SUT VIC AB 3-0 SH 27X BRD (SUTURE) ×2 IMPLANT
SUTURE MNCRL 4-0 27XMF (SUTURE) ×1 IMPLANT
SYR 10ML LL (SYRINGE) ×4 IMPLANT
SYR 20ML LL LF (SYRINGE) ×2 IMPLANT
WATER STERILE IRR 1000ML POUR (IV SOLUTION) ×2 IMPLANT

## 2020-07-23 NOTE — Anesthesia Preprocedure Evaluation (Addendum)
Anesthesia Evaluation  Patient identified by MRN, date of birth, ID band Patient awake    Reviewed: Allergy & Precautions, NPO status , Patient's Chart, lab work & pertinent test results  History of Anesthesia Complications Negative for: history of anesthetic complications  Airway Mallampati: II  TM Distance: >3 FB Neck ROM: Full    Dental no notable dental hx. (+) Teeth Intact   Pulmonary asthma , sleep apnea , neg COPD, Patient abstained from smoking.Not current smoker,  Takes inhalers once every few weeks, has not taken one for a few weeks.  OSA, has not received her cpap yet   Pulmonary exam normal breath sounds clear to auscultation       Cardiovascular Exercise Tolerance: Good METShypertension, (-) CAD and (-) Past MI (-) dysrhythmias + Valvular Problems/Murmurs MR  Rhythm:Regular Rate:Normal - Systolic murmurs TTE 2020: 1. The left ventricle has normal systolic function with an ejection  fraction of 60-65%. The cavity size was normal. Left ventricular diastolic  parameters were normal.  2. The right ventricle has normal systolic function. The cavity was  normal. There is no increase in right ventricular wall thickness. Right  ventricular systolic pressure is mildly elevated with an estimated  pressure of 35.8 mmHg.  3. Left atrial size was moderate to severely dilated.  4. Mitral valve regurgitation is moderate    Neuro/Psych PSYCHIATRIC DISORDERS Depression negative neurological ROS     GI/Hepatic GERD  Medicated and Controlled,(+)     (-) substance abuse  ,   Endo/Other  neg diabetes  Renal/GU negative Renal ROS     Musculoskeletal   Abdominal   Peds  Hematology  (+) anemia ,   Anesthesia Other Findings Past Medical History: No date: Arthritis No date: Asthma No date: Chronic cough No date: Colon polyps No date: Depression No date: GERD (gastroesophageal reflux disease) No date: Hiatal  hernia     Comment:  LARGE No date: History of chicken pox No date: Hypertension No date: Macular degeneration No date: Multinodular goiter     Comment:  FNA in past neg h/o thyroid cysts No date: Pernicious anemia     Comment:  PERNICIOUS  No date: RLS (restless legs syndrome) No date: Sleep apnea     Comment:  NO CPAP  Reproductive/Obstetrics                            Anesthesia Physical Anesthesia Plan  ASA: III  Anesthesia Plan: General   Post-op Pain Management:    Induction: Intravenous  PONV Risk Score and Plan: 4 or greater and Ondansetron, Dexamethasone and Treatment may vary due to age or medical condition  Airway Management Planned: Oral ETT  Additional Equipment: None  Intra-op Plan:   Post-operative Plan: Extubation in OR  Informed Consent: I have reviewed the patients History and Physical, chart, labs and discussed the procedure including the risks, benefits and alternatives for the proposed anesthesia with the patient or authorized representative who has indicated his/her understanding and acceptance.     Dental advisory given  Plan Discussed with: CRNA and Surgeon  Anesthesia Plan Comments: (Discussed risks of anesthesia with patient, including PONV, sore throat, lip/dental damage. Rare risks discussed as well, such as cardiorespiratory and neurological sequelae. Patient understands.)        Anesthesia Quick Evaluation

## 2020-07-23 NOTE — Op Note (Signed)
Preoperative diagnosis: Incarcerated incisional hernia Postoperative diagnosis: Strangulated incisional hernia  Procedure:  Open strangulated incisional hernia repair with mesh  Anesthesia: GETA  Surgeon: Sung Amabile  Wound Classification: Clean  Specimen: none  Complications: None  Estimated Blood Loss: minimal  Indications:see HPI  Findings: 1. 1-1/2 cm x 1.7 cm strangulated incisional hernia 4. Tension free repair achieved with mesh and suture 5. Adequate hemostasis  Description of procedure: The patient was brought to the operating room and general anesthesia was induced. A time-out was completed verifying correct patient, procedure, site, positioning, and implant(s) and/or special equipment prior to beginning this procedure. Antibiotics were administered prior to making the incision. SCDs placed. The anterior abdominal wall was prepped and draped in the standard sterile fashion.   An supraumbilical incision overlying the obviously palpable hernia made after infusing the preplanned incision with half percent Marcaine.  Dissection carried down to large ischemic hernia sac, surrounded by preperitoneal fat..  This was dissected off the surrounding structures, down to a narrow fascial defect.  Decision was made to resect the preperitoneal fat contents and the ischemic hernia sac to facilitate reduction and closure of hernia.  Excess preperitoneal fat contents were removed via LigaSure.  Hernia sac was then transected and noted to have viable omental fat within it.  The extremely thick and ischemic hernia sac was then transected using LigaSure to remove the excess sac.  The sac stump hemostasis was confirmed prior to reducing it along with the omental fat contents.  Defect itself was measured to be 1-1/2 x 1.7 cm.due to patient's excess weight as well as the thinned out fascia, decision was made to proceed with a mesh repair.    A 6 cm umbilical mesh was placed within the preperitoneal  cavity through the present defect and secured in place on the abdominal wall using interrupted 0 Ethibond sutures.  Once the mesh was noted to be laying flat and secured to the abdominal wall the defect itself was primary closed using 0 Ethibond.  The fascia as well as the skin incision was then infused with Exparel. The wound was irrigated and closed in a multilayer fashion, using 3-0 Vicryl for the deep dermal layer in an interrupted fashion and running 4-0 Monocryl in a subcuticular fashion.  Wound was then dressed with Dermabond.  Patient was then successfully awakened and transferred to PACU in stable condition.  At the end of the procedure sponge and instrument counts were correct

## 2020-07-23 NOTE — Transfer of Care (Signed)
Immediate Anesthesia Transfer of Care Note  Patient: Sharon Cervantes  Procedure(s) Performed: Procedure(s): HERNIA REPAIR VENTRAL ADULT (N/A)  Patient Location: PACU  Anesthesia Type:General  Level of Consciousness: sedated  Airway & Oxygen Therapy: Patient Spontanous Breathing and Patient connected to face mask oxygen  Post-op Assessment: Report given to RN and Post -op Vital signs reviewed and stable  Post vital signs: Reviewed and stable  Last Vitals:  Vitals:   07/23/20 0816 07/23/20 1218  BP: 128/84 128/80  Pulse: 73 87  Resp: 16 18  Temp: 37 C (!) 36.4 C  SpO2: 100% 99%    Complications: No apparent anesthesia complications

## 2020-07-23 NOTE — Anesthesia Postprocedure Evaluation (Signed)
Anesthesia Post Note  Patient: Sharon Cervantes  Procedure(s) Performed: HERNIA REPAIR VENTRAL ADULT (N/A Abdomen)  Patient location during evaluation: PACU Anesthesia Type: General Level of consciousness: awake and alert Pain management: pain level controlled Vital Signs Assessment: post-procedure vital signs reviewed and stable Respiratory status: spontaneous breathing, nonlabored ventilation, respiratory function stable and patient connected to nasal cannula oxygen Cardiovascular status: blood pressure returned to baseline and stable Postop Assessment: no apparent nausea or vomiting Anesthetic complications: no   No complications documented.   Last Vitals:  Vitals:   07/23/20 1254 07/23/20 1316  BP: 132/83 (!) 144/74  Pulse: 78 86  Resp: 15 16  Temp:  (!) 36.3 C  SpO2: 96% 100%    Last Pain:  Vitals:   07/23/20 1316  TempSrc: Temporal  PainSc: 0-No pain                 Yevette Edwards

## 2020-07-23 NOTE — Discharge Instructions (Addendum)
AMBULATORY SURGERY  DISCHARGE INSTRUCTIONS   1) The drugs that you were given will stay in your system until tomorrow so for the next 24 hours you should not:  A) Drive an automobile B) Make any legal decisions C) Drink any alcoholic beverage   2) You may resume regular meals tomorrow.  Today it is better to start with liquids and gradually work up to solid foods.  You may eat anything you prefer, but it is better to start with liquids, then soup and crackers, and gradually work up to solid foods.   3) Please notify your doctor immediately if you have any unusual bleeding, trouble breathing, redness and pain at the surgery site, drainage, fever, or pain not relieved by medication.    4) Additional Instructions:        Please contact your physician with any problems or Same Day Surgery at [phone], Monday through Friday 6 am to 4 pm, or [facility] number at [phone].   Hernia repair, Care After This sheet gives you information about how to care for yourself after your procedure. Your health care provider may also give you more specific instructions. If you have problems or questions, contact your health care provider. What can I expect after the procedure? After your procedure, it is common to have the following:  Pain in your abdomen, especially in the incision areas. You will be given medicine to control the pain.  Tiredness. This is a normal part of the recovery process. Your energy level will return to normal over the next several weeks.  Changes in your bowel movements, such as constipation or needing to go more often. Talk with your health care provider about how to manage this. Follow these instructions at home: Medicines   tylenol and advil as needed for discomfort.  Please alternate between the two every four hours as needed for pain.     Use narcotics, if prescribed, only when tylenol and motrin is not enough to control pain.    325-650mg every 8hrs to max of 3000mg/24hrs (including the 325mg in every norco dose) for the tylenol.     Advil up to 800mg per dose every 8hrs as needed for pain.    PLEASE RECORD NUMBER OF PILLS TAKEN UNTIL NEXT FOLLOW UP APPT.  THIS WILL HELP DETERMINE HOW READY YOU ARE TO BE RELEASED FROM ANY ACTIVITY RESTRICTIONS  Do not drive or use heavy machinery while taking prescription pain medicine.  Do not drink alcohol while taking prescription pain medicine.  Incision care     Follow instructions from your health care provider about how to take care of your incision areas. Make sure you: ? Keep your incisions clean and dry. ? Wash your hands with soap and water before and after applying medicine to the areas, and before and after changing your bandage (dressing). If soap and water are not available, use hand sanitizer. ? Change your dressing as told by your health care provider. ? Leave stitches (sutures), skin glue, or adhesive strips in place. These skin closures may need to stay in place for 2 weeks or longer. If adhesive strip edges start to loosen and curl up, you may trim the loose edges. Do not remove adhesive strips completely unless your health care provider tells you to do that.  Do not wear tight clothing over the incisions. Tight clothing may rub and irritate the incision areas, which may cause the incisions to open.  Do not take baths, swim, or   use a hot tub until your health care provider approves. OK TO SHOWER IN 24HRS.    Check your incision area every day for signs of infection. Check for: ? More redness, swelling, or pain. ? More fluid or blood. ? Warmth. ? Pus or a bad smell. Activity  Avoid lifting anything that is heavier than 10 lb (4.5 kg) for 2 weeks or until your health care provider says it is okay.  No pushing/pulling greater than 30lbs  You may resume normal activities as told by your health care provider. Ask your health care provider what activities are  safe for you.  Take rest breaks during the day as needed. Eating and drinking  Follow instructions from your health care provider about what you can eat after surgery.  To prevent or treat constipation while you are taking prescription pain medicine, your health care provider may recommend that you: ? Drink enough fluid to keep your urine clear or pale yellow. ? Take over-the-counter or prescription medicines. ? Eat foods that are high in fiber, such as fresh fruits and vegetables, whole grains, and beans. ? Limit foods that are high in fat and processed sugars, such as fried and sweet foods. General instructions  Ask your health care provider when you will need an appointment to get your sutures or staples removed.  Keep all follow-up visits as told by your health care provider. This is important. Contact a health care provider if:  You have more redness, swelling, or pain around your incisions.  You have more fluid or blood coming from the incisions.  Your incisions feel warm to the touch.  You have pus or a bad smell coming from your incisions or your dressing.  You have a fever.  You have an incision that breaks open (edges not staying together) after sutures or staples have been removed. Get help right away if:  You develop a rash.  You have chest pain or difficulty breathing.  You have pain or swelling in your legs.  You feel light-headed or you faint.  Your abdomen swells (becomes distended).  You have nausea or vomiting.  You have blood in your stool (feces). This information is not intended to replace advice given to you by your health care provider. Make sure you discuss any questions you have with your health care provider. Document Released: 03/05/2005 Document Revised: 05/05/2018 Document Reviewed: 05/17/2016 Elsevier Interactive Patient Education  2019 [employer]    

## 2020-07-23 NOTE — Interval H&P Note (Signed)
History and Physical Interval Note:  07/23/2020 10:06 AM  Sharon Cervantes  has presented today for surgery, with the diagnosis of K43.0 Incisional hernia w/o obstruction or gangrene.  The various methods of treatment have been discussed with the patient and family. After consideration of risks, benefits and other options for treatment, the patient has consented to  Procedure(s): HERNIA REPAIR VENTRAL ADULT (N/A) as a surgical intervention.  The patient's history has been reviewed, patient examined, no change in status, stable for surgery.  I have reviewed the patient's chart and labs.  Questions were answered to the patient's satisfaction.     Alisha Bacus Tonna Boehringer

## 2020-07-23 NOTE — Anesthesia Procedure Notes (Signed)
Procedure Name: Intubation Date/Time: 07/23/2020 10:36 AM Performed by: Doreen Salvage, CRNA Pre-anesthesia Checklist: Patient identified, Patient being monitored, Timeout performed, Emergency Drugs available and Suction available Patient Re-evaluated:Patient Re-evaluated prior to induction Oxygen Delivery Method: Circle system utilized Preoxygenation: Pre-oxygenation with 100% oxygen Induction Type: IV induction Ventilation: Mask ventilation without difficulty Laryngoscope Size: Mac and 3 Grade View: Grade I Tube type: Oral Tube size: 6.5 mm Number of attempts: 1 Airway Equipment and Method: Stylet Placement Confirmation: ETT inserted through vocal cords under direct vision,  positive ETCO2 and breath sounds checked- equal and bilateral Secured at: 19 cm Tube secured with: Tape Dental Injury: Teeth and Oropharynx as per pre-operative assessment

## 2020-07-25 LAB — SURGICAL PATHOLOGY

## 2020-07-30 ENCOUNTER — Encounter: Payer: Self-pay | Admitting: *Deleted

## 2020-07-30 DIAGNOSIS — J4550 Severe persistent asthma, uncomplicated: Secondary | ICD-10-CM

## 2020-07-30 NOTE — Progress Notes (Signed)
Pulmonary Individual Treatment Plan  Patient Details  Name: Sharon Cervantes MRN: [id number] Date of Birth: 09/09/1950 Referring Provider:     Pulmonary Rehab from 04/29/2020 in [facility] Cardiac and Pulmonary Rehab  Referring Provider Claudette Stapler MD      Initial Encounter Date:    Pulmonary Rehab from 04/29/2020 in [facility] Cardiac and Pulmonary Rehab  Date 04/29/20      Visit Diagnosis: Severe persistent asthma, unspecified whether complicated  Patient's Home Medications on Admission:  Current Outpatient Medications:  .  acetaminophen (TYLENOL) 325 MG tablet, Take 2 tablets (650 mg total) by mouth every 8 (eight) hours as needed for mild pain., Disp: 40 tablet, Rfl: 0 .  albuterol (PROVENTIL HFA;VENTOLIN HFA) 108 (90 Base) MCG/ACT inhaler, Inhale 2 puffs into the lungs every 6 (six) hours as needed for wheezing or shortness of breath. , Disp: , Rfl:  .  albuterol (PROVENTIL) (2.5 MG/3ML) 0.083% nebulizer solution, Take 2.5 mg by nebulization 2 (two) times daily as needed for wheezing or shortness of breath., Disp: , Rfl:  .  budesonide (PULMICORT) 0.5 MG/2ML nebulizer solution, Take 0.5 mg by nebulization 2 (two) times daily as needed (shortness of breath or wheezing). , Disp: , Rfl:  .  cyanocobalamin (,VITAMIN B-12,) 1000 MCG/ML injection, Inject 1,000 mcg into the muscle every 30 (thirty) days., Disp: , Rfl:  .  diclofenac (VOLTAREN) 50 MG EC tablet, Take 50 mg by mouth 2 (two) times daily., Disp: , Rfl:  .  diclofenac Sodium (VOLTAREN) 1 % GEL, Apply 1 application topically 4 (four) times daily as needed (pain)., Disp: , Rfl:  .  docusate sodium (COLACE) 100 MG capsule, Take 1 capsule (100 mg total) by mouth 2 (two) times daily as needed for up to 10 days for mild constipation., Disp: 20 capsule, Rfl: 0 .  esomeprazole (NEXIUM) 40 MG capsule, Take 40 mg by mouth daily. , Disp: , Rfl:  .  FLUoxetine (PROZAC) 10 MG tablet, Take 10 mg by mouth daily., Disp: , Rfl:  .  furosemide (LASIX) 20 MG  tablet, Take 20 mg by mouth daily as needed for edema., Disp: , Rfl:  .  gabapentin (NEURONTIN) 300 MG capsule, Take 300 mg by mouth 3 (three) times daily. , Disp: , Rfl:  .  hydrochlorothiazide (HYDRODIURIL) 12.5 MG tablet, Take 12.5 mg by mouth daily. , Disp: , Rfl:  .  HYDROcodone-acetaminophen (NORCO) 5-325 MG tablet, Take 1 tablet by mouth every 6 (six) hours as needed for up to 6 doses for moderate pain., Disp: 6 tablet, Rfl: 0 .  HYDROcodone-homatropine (HYCODAN) 5-1.5 MG/5ML syrup, Take 5 mLs by mouth every 6 (six) hours as needed for cough., Disp: , Rfl:  .  ibuprofen (ADVIL) 200 MG tablet, Take 800 mg by mouth every 6 (six) hours as needed for headache or moderate pain., Disp: , Rfl:  .  montelukast (SINGULAIR) 10 MG tablet, Take 1 tablet (10 mg total) by mouth at bedtime. appt further refills (Patient taking differently: Take 10 mg by mouth at bedtime. ), Disp: 90 tablet, Rfl: 1 .  oxymetazoline (AFRIN) 0.05 % nasal spray, Place 1 spray into both nostrils daily as needed (nose bleeds)., Disp: , Rfl:  .  pramipexole (MIRAPEX) 1.5 MG tablet, Take 1.5 mg by mouth at bedtime. , Disp: , Rfl:  .  traMADol (ULTRAM) 50 MG tablet, Take 50 mg by mouth every 8 (eight) hours as needed for moderate pain., Disp: , Rfl:  .  traZODone (DESYREL) 100 MG tablet, Take 100  mg by mouth at bedtime as needed for sleep., Disp: , Rfl:  .  venlafaxine XR (EFFEXOR-XR) 75 MG 24 hr capsule, Take 150 mg by mouth daily with breakfast. , Disp: , Rfl:  No current facility-administered medications for this visit.  Facility-Administered Medications Ordered in Other Visits:  .  [COMPLETED] sodium chloride 0.9 % nebulizer solution 3 mL, 3 mL, Nebulization, Once, 3 mL at 10/03/18 1130 **FOLLOWED BY** [COMPLETED] methacholine (PROVOCHOLINE) inhaler solution 0.125 mg, 2 mL, Inhalation, Once, 0.125 mg at 10/03/18 1139 **FOLLOWED BY** methacholine (PROVOCHOLINE) inhaler solution 0.5 mg, 2 mL, Inhalation, Once **FOLLOWED BY**  methacholine (PROVOCHOLINE) inhaler solution 2 mg, 2 mL, Inhalation, Once **FOLLOWED BY** methacholine (PROVOCHOLINE) inhaler solution 8 mg, 2 mL, Inhalation, Once **FOLLOWED BY** methacholine (PROVOCHOLINE) inhaler solution 32 mg, 2 mL, Inhalation, Once **FOLLOWED BY** [COMPLETED] albuterol (PROVENTIL) (2.5 MG/3ML) 0.083% nebulizer solution 2.5 mg, 2.5 mg, Nebulization, Once, Ottie Glazier, MD, 2.5 mg at 10/03/18 1146  Past Medical History: Past Medical History:  Diagnosis Date  . Arthritis   . Asthma   . Chronic cough   . Colon polyps   . Depression   . GERD (gastroesophageal reflux disease)   . Hiatal hernia    LARGE  . History of chicken pox   . Hypertension   . Macular degeneration   . Multinodular goiter    FNA in past neg h/o thyroid cysts  . Pernicious anemia    PERNICIOUS   . RLS (restless legs syndrome)   . Sleep apnea    NO CPAP    Tobacco Use: Social History   Tobacco Use  Smoking Status Never Smoker  Smokeless Tobacco Never Used    Labs: Recent Review Scientist, physiological    Labs for ITP Cardiac and Pulmonary Rehab Latest Ref Rng & Units 10/12/2018   Hemoglobin A1c 4.6 - 6.5 % 5.6       Pulmonary Assessment Scores:  Pulmonary Assessment Scores    Row Name 04/29/20 0834         ADL UCSD   ADL Phase Entry     SOB Score total 29     Rest 0     Walk 0     Stairs 5     Bath 0     Dress 0     Shop 1       CAT Score   CAT Score 26       mMRC Score   mMRC Score 1            UCSD: Self-administered rating of dyspnea associated with activities of daily living (ADLs) 6-point scale (0 = "not at all" to 5 = "maximal or unable to do because of breathlessness")  Scoring Scores range from 0 to 120.  Minimally important difference is 5 units  CAT: CAT can identify the health impairment of COPD patients and is better correlated with disease progression.  CAT has a scoring range of zero to 40. The CAT score is classified into four groups of low (less than  10), medium (10 - 20), high (21-30) and very high (31-40) based on the impact level of disease on health status. A CAT score over 10 suggests significant symptoms.  A worsening CAT score could be explained by an exacerbation, poor medication adherence, poor inhaler technique, or progression of COPD or comorbid conditions.  CAT MCID is 2 points  mMRC: mMRC (Modified Medical Research Council) Dyspnea Scale is used to assess the degree of baseline functional disability in patients  of respiratory disease due to dyspnea. No minimal important difference is established. A decrease in score of 1 point or greater is considered a positive change.   Pulmonary Function Assessment:  Pulmonary Function Assessment - 04/29/20 0756      Breath   Bilateral Breath Sounds Clear    Shortness of Breath No;Limiting activity           Exercise Target Goals: Exercise Program Goal: Individual exercise prescription set using results from initial 6 min walk test and THRR while considering  patient's activity barriers and safety.   Exercise Prescription Goal: Initial exercise prescription builds to 30-45 minutes a day of aerobic activity, 2-3 days per week.  Home exercise guidelines will be given to patient during program as part of exercise prescription that the participant will acknowledge.  Education: Aerobic Exercise: - Group verbal and visual presentation on the components of exercise prescription. Introduces F.I.T.T principle from ACSM for exercise prescriptions.  Reviews F.I.T.T. principles of aerobic exercise including progression. Written material given at graduation.   Education: Resistance Exercise: - Group verbal and visual presentation on the components of exercise prescription. Introduces F.I.T.T principle from ACSM for exercise prescriptions  Reviews F.I.T.T. principles of resistance exercise including progression. Written material given at graduation.    Education: Exercise & Equipment Safety: -  Individual verbal instruction and demonstration of equipment use and safety with use of the equipment.   Pulmonary Rehab from 07/10/2020 in [facility] Cardiac and Pulmonary Rehab  Date 04/29/20  Educator [facility]  Instruction Review Code 1- Verbalizes Understanding      Education: Exercise Physiology & General Exercise Guidelines: - Group verbal and written instruction with models to review the exercise physiology of the cardiovascular system and associated critical values. Provides general exercise guidelines with specific guidelines to those with heart or lung disease.    Education: Flexibility, Balance, Mind/Body Relaxation: - Group verbal and visual presentation with interactive activity on the components of exercise prescription. Introduces F.I.T.T principle from ACSM for exercise prescriptions. Reviews F.I.T.T. principles of flexibility and balance exercise training including progression. Also discusses the mind body connection.  Reviews various relaxation techniques to help reduce and manage stress (i.e. Deep breathing, progressive muscle relaxation, and visualization). Balance handout provided to take home. Written material given at graduation.   Pulmonary Rehab from 07/10/2020 in [facility] Cardiac and Pulmonary Rehab  Date 07/03/20  Educator AS  Instruction Review Code 1- Verbalizes Understanding      Activity Barriers & Risk Stratification:  Activity Barriers & Cardiac Risk Stratification - 04/29/20 0757      Activity Barriers & Cardiac Risk Stratification   Activity Barriers Deconditioning;Joint Problems;Other (comment);Muscular Weakness;Arthritis;Shortness of Breath;Right Knee Replacement;Balance Concerns    Comments TKR completed July 2021           6 Minute Walk:  6 Minute Walk    Row Name 04/29/20 0903         6 Minute Walk   Phase Initial     Distance 1320 feet     Walk Time 6 minutes     # of Rest Breaks 0     MPH 2.5     METS 2.34     RPE 13     Perceived Dyspnea  2      VO2 Peak 8.2     Symptoms Yes (comment)     Comments SOB     Resting HR 76 bpm     Resting BP 134/74     Resting Oxygen Saturation  95 %     Exercise Oxygen Saturation  during 6 min walk 89 %     Max Ex. HR 108 bpm     Max Ex. BP 144/66     2 Minute Post BP 136/70       Interval HR   1 Minute HR 100     2 Minute HR 98     3 Minute HR 100     4 Minute HR 106     5 Minute HR 101     6 Minute HR 108     2 Minute Post HR 86     Interval Heart Rate? Yes       Interval Oxygen   Interval Oxygen? Yes     Baseline Oxygen Saturation % 95 %     1 Minute Oxygen Saturation % 91 %     1 Minute Liters of Oxygen 0 L  Room Air     2 Minute Oxygen Saturation % 89 %     2 Minute Liters of Oxygen 0 L     3 Minute Oxygen Saturation % 92 %     3 Minute Liters of Oxygen 0 L     4 Minute Oxygen Saturation % 93 %     4 Minute Liters of Oxygen 0 L     5 Minute Oxygen Saturation % 94 %     5 Minute Liters of Oxygen 0 L     6 Minute Oxygen Saturation % 94 %     6 Minute Liters of Oxygen 0 L     2 Minute Post Oxygen Saturation % 96 %     2 Minute Post Liters of Oxygen 0 L           Oxygen Initial Assessment:  Oxygen Initial Assessment - 04/29/20 0756      Home Oxygen   Home Oxygen Device None    Sleep Oxygen Prescription None    Home Exercise Oxygen Prescription None    Home Resting Oxygen Prescription None      Initial 6 min Walk   Oxygen Used None      Program Oxygen Prescription   Program Oxygen Prescription None      Intervention   Short Term Goals To learn and understand importance of monitoring SPO2 with pulse oximeter and demonstrate accurate use of the pulse oximeter.;To learn and understand importance of maintaining oxygen saturations>88%;To learn and demonstrate proper pursed lip breathing techniques or other breathing techniques.;To learn and demonstrate proper use of respiratory medications    Long  Term Goals Verbalizes importance of monitoring SPO2 with pulse oximeter  and return demonstration;Maintenance of O2 saturations>88%;Exhibits proper breathing techniques, such as pursed lip breathing or other method taught during program session;Compliance with respiratory medication;Demonstrates proper use of MDI's           Oxygen Re-Evaluation:  Oxygen Re-Evaluation    Row Name 05/13/20 0802 07/01/20 0813           Program Oxygen Prescription   Program Oxygen Prescription None None        Home Oxygen   Home Oxygen Device None None      Sleep Oxygen Prescription None CPAP  She has not recieved it yet      Home Exercise Oxygen Prescription None None      Home Resting Oxygen Prescription None None        Goals/Expected Outcomes   Short Term Goals To learn and understand importance of  monitoring SPO2 with pulse oximeter and demonstrate accurate use of the pulse oximeter.;To learn and understand importance of maintaining oxygen saturations>88%;To learn and demonstrate proper pursed lip breathing techniques or other breathing techniques.;To learn and demonstrate proper use of respiratory medications To learn and understand importance of monitoring SPO2 with pulse oximeter and demonstrate accurate use of the pulse oximeter.;To learn and understand importance of maintaining oxygen saturations>88%;To learn and demonstrate proper pursed lip breathing techniques or other breathing techniques.;To learn and demonstrate proper use of respiratory medications      Long  Term Goals Verbalizes importance of monitoring SPO2 with pulse oximeter and return demonstration;Maintenance of O2 saturations>88%;Exhibits proper breathing techniques, such as pursed lip breathing or other method taught during program session;Compliance with respiratory medication;Demonstrates proper use of MDI's Verbalizes importance of monitoring SPO2 with pulse oximeter and return demonstration;Maintenance of O2 saturations>88%;Exhibits proper breathing techniques, such as pursed lip breathing or other  method taught during program session;Compliance with respiratory medication;Demonstrates proper use of MDI's      Comments Sharon Cervantes is struggling with her breathing.  She is supposed to use her nebulizer twice a day but only getting once a day.  She is trying to use her pursed lip breathing some.  She coughing more and needs her inhaler more often.  She is going to try to use her nebulizer the twice a day. Her sats have been good. Sharon Cervantes Cervantes trying to remember to due pursed lip breathing, but it doesn't feel natural still. She takes her nebulizer in the morning, but not at night due to shaking.      Goals/Expected Outcomes Short: Nebulizer twice a day Long: Conitnue to work on improving breathing. ST: continue to practice PLB LT: use PLB during activity.             Oxygen Discharge (Final Oxygen Re-Evaluation):  Oxygen Re-Evaluation - 07/01/20 0813      Program Oxygen Prescription   Program Oxygen Prescription None      Home Oxygen   Home Oxygen Device None    Sleep Oxygen Prescription CPAP   She has not recieved it yet   Home Exercise Oxygen Prescription None    Home Resting Oxygen Prescription None      Goals/Expected Outcomes   Short Term Goals To learn and understand importance of monitoring SPO2 with pulse oximeter and demonstrate accurate use of the pulse oximeter.;To learn and understand importance of maintaining oxygen saturations>88%;To learn and demonstrate proper pursed lip breathing techniques or other breathing techniques.;To learn and demonstrate proper use of respiratory medications    Long  Term Goals Verbalizes importance of monitoring SPO2 with pulse oximeter and return demonstration;Maintenance of O2 saturations>88%;Exhibits proper breathing techniques, such as pursed lip breathing or other method taught during program session;Compliance with respiratory medication;Demonstrates proper use of MDI's    Comments Sharon Cervantes trying to remember to due pursed lip breathing,  but it doesn't feel natural still. She takes her nebulizer in the morning, but not at night due to shaking.    Goals/Expected Outcomes ST: continue to practice PLB LT: use PLB during activity.           Initial Exercise Prescription:  Initial Exercise Prescription - 04/29/20 0900      Date of Initial Exercise RX and Referring Provider   Date 04/29/20    Referring Provider Claudette Stapler MD      Treadmill   MPH 2    Grade 0.5    Minutes 15    METs 2.67  REL-XR   Level 1    Speed 50    Minutes 15    METs 2.6      T5 Nustep   Level 2    SPM 80    Minutes 15    METs 2.6      Prescription Details   Frequency (times per week) 2    Duration Progress to 30 minutes of continuous aerobic without signs/symptoms of physical distress      Intensity   THRR 40-80% of Max Heartrate 106-135    Ratings of Perceived Exertion 11-13    Perceived Dyspnea 0-4      Progression   Progression Continue to progress workloads to maintain intensity without signs/symptoms of physical distress.      Resistance Training   Training Prescription Yes    Weight 3 lb    Reps 10-15           Perform Capillary Blood Glucose checks as needed.  Exercise Prescription Changes:  Exercise Prescription Changes    Row Name 04/29/20 0900 05/13/20 1500 05/26/20 1800 07/09/20 0900       Response to Exercise   Blood Pressure (Admit) 134/74 120/72 122/66 116/70    Blood Pressure (Exercise) 144/66 136/74 132/78 120/66    Blood Pressure (Exit) 128/60 124/70 122/74 110/64    Heart Rate (Admit) 76 bpm 74 bpm 88 bpm 86 bpm    Heart Rate (Exercise) 108 bpm 89 bpm 97 bpm 108 bpm    Heart Rate (Exit) 86 bpm 85 bpm 90 bpm 90 bpm    Oxygen Saturation (Admit) 95 % 97 % 94 % 96 %    Oxygen Saturation (Exercise) 89 % 94 % 96 % 97 %    Oxygen Saturation (Exit) 94 % 95 % 95 % 97 %    Rating of Perceived Exertion (Exercise) _0 Perceived Dyspnea (Exercise) _1 Symptoms SOB SOB -- knee pain     Comments walk test results -- -- --    Duration -- Continue with 30 min of aerobic exercise without signs/symptoms of physical distress. Continue with 30 min of aerobic exercise without signs/symptoms of physical distress. Continue with 30 min of aerobic exercise without signs/symptoms of physical distress.    Intensity -- THRR unchanged THRR unchanged THRR unchanged      Progression   Progression -- Continue to progress workloads to maintain intensity without signs/symptoms of physical distress. Continue to progress workloads to maintain intensity without signs/symptoms of physical distress. Continue to progress workloads to maintain intensity without signs/symptoms of physical distress.    Average METs -- 2.38 2.3 2.66      Resistance Training   Training Prescription -- Yes Yes Yes    Weight -- 3 lb  3lb  3lb    Reps -- 10-15 10-15 10-15      Interval Training   Interval Training -- No No No      Treadmill   MPH -- 2.2 -- 1.9    Grade -- 0.5 -- 1    Minutes -- 15 -- 15    METs -- 2.84 -- 2.72      REL-XR   Level -- 1 3 --    Speed -- -- 50 --    Minutes -- 15 15 --    METs -- 2 -- --      T5 Nustep   Level -- _2 SPM -- --  80 --    Minutes -- _0 METs -- 2.3 2.3 2.6      Home Exercise Plan   Plans to continue exercise at -- Home (comment)  walking, gym -- Home (comment)  walking, gym    Frequency -- Add 2 additional days to program exercise sessions. -- Add 2 additional days to program exercise sessions.    Initial Home Exercises Provided -- 05/13/20 -- 05/13/20           Exercise Comments:  Exercise Comments    Row Name 05/22/20 0747 06/17/20 0719         Exercise Comments Sharon Cervantes saw the doctor yesterday and they started her on prednisone for her knee.  She is also supposed to stay off the treadmill for 3 weeks as well.  We also talked about making sure she continues to exercise to stay out of her depression. Sharon Cervantes called and said she will not be  here for the next two weeks.  She also left message that her physician has started her with PT for her knee.             Exercise Goals and Review:  Exercise Goals    Row Name 04/29/20 0909             Exercise Goals   Increase Physical Activity Yes       Intervention Provide advice, education, support and counseling about physical activity/exercise needs.;Develop an individualized exercise prescription for aerobic and resistive training based on initial evaluation findings, risk stratification, comorbidities and participant's personal goals.       Expected Outcomes Short Term: Attend rehab on a regular basis to increase amount of physical activity.;Long Term: Add in home exercise to make exercise part of routine and to increase amount of physical activity.;Long Term: Exercising regularly at least 3-5 days a week.       Increase Strength and Stamina Yes       Intervention Provide advice, education, support and counseling about physical activity/exercise needs.;Develop an individualized exercise prescription for aerobic and resistive training based on initial evaluation findings, risk stratification, comorbidities and participant's personal goals.       Expected Outcomes Short Term: Increase workloads from initial exercise prescription for resistance, speed, and METs.;Short Term: Perform resistance training exercises routinely during rehab and add in resistance training at home;Long Term: Improve cardiorespiratory fitness, muscular endurance and strength as measured by increased METs and functional capacity (6MWT)       Able to understand and use rate of perceived exertion (RPE) scale Yes       Intervention Provide education and explanation on how to use RPE scale       Expected Outcomes Short Term: Able to use RPE daily in rehab to express subjective intensity level;Long Term:  Able to use RPE to guide intensity level when exercising independently       Able to understand and use Dyspnea  scale Yes       Intervention Provide education and explanation on how to use Dyspnea scale       Expected Outcomes Short Term: Able to use Dyspnea scale daily in rehab to express subjective sense of shortness of breath during exertion;Long Term: Able to use Dyspnea scale to guide intensity level when exercising independently       Knowledge and understanding of Target Heart Rate Range (THRR) Yes       Intervention Provide education and explanation of THRR including how the numbers  were predicted and where they are located for reference       Expected Outcomes Short Term: Able to state/look up THRR;Long Term: Able to use THRR to govern intensity when exercising independently;Short Term: Able to use daily as guideline for intensity in rehab       Able to check pulse independently Yes       Intervention Provide education and demonstration on how to check pulse in carotid and radial arteries.;Review the importance of being able to check your own pulse for safety during independent exercise       Expected Outcomes Short Term: Able to explain why pulse checking is important during independent exercise;Long Term: Able to check pulse independently and accurately       Understanding of Exercise Prescription Yes       Intervention Provide education, explanation, and written materials on patient's individual exercise prescription       Expected Outcomes Short Term: Able to explain program exercise prescription;Long Term: Able to explain home exercise prescription to exercise independently              Exercise Goals Re-Evaluation :  Exercise Goals Re-Evaluation    Row Name 05/01/20 0755 05/13/20 0805 05/26/20 1804 06/10/20 1632 07/01/20 0809     Exercise Goal Re-Evaluation   Exercise Goals Review Increase Physical Activity;Able to understand and use rate of perceived exertion (RPE) scale;Knowledge and understanding of Target Heart Rate Range (THRR);Understanding of Exercise Prescription;Able to check  pulse independently;Increase Strength and Stamina Increase Physical Activity;Able to understand and use rate of perceived exertion (RPE) scale;Knowledge and understanding of Target Heart Rate Range (THRR);Understanding of Exercise Prescription;Able to check pulse independently;Increase Strength and Stamina;Able to understand and use Dyspnea scale Increase Physical Activity;Able to understand and use rate of perceived exertion (RPE) scale;Knowledge and understanding of Target Heart Rate Range (THRR);Understanding of Exercise Prescription;Able to check pulse independently;Increase Strength and Stamina;Able to understand and use Dyspnea scale -- Increase Physical Activity;Able to understand and use rate of perceived exertion (RPE) scale;Knowledge and understanding of Target Heart Rate Range (THRR);Understanding of Exercise Prescription;Able to check pulse independently;Increase Strength and Stamina;Able to understand and use Dyspnea scale   Comments Reviewed RPE and dyspnea scales, THR and program prescription with pt today.  Pt voiced understanding and was given a copy of goals to take home. Reviewed home exercise with pt today.  Pt plans to walk at home for exercise.  Reviewed THR, pulse, RPE, sign and symptoms, pulse oximetery and when to call 911 or MD.  Also discussed weather considerations and indoor options.  Pt voiced understanding.  Sharon Cervantes is going to get back to her exercise routine. Sharon Cervantes has a bruised meniscus and her Dr recommended her not do the TM for now.  Staff will monitor progress. out since last review Sharon Cervantes is back for the first time since her last review. She has not been exercising at home due to knee problems - starting PT on Friday. MD says she can walk on a flat surface and seated machines that don't irritate her knee (XR hurt her knee). Will monitor to see how she progresses and re-evaluate next goal review.   Expected Outcomes Short: Use RPE daily to regulate intensity. Long: Follow  program prescription in THR. Short: Get back to exericse routine.  Long: Continue to improve stamina. Short:  maintain consistent exercise Long: get back to walking -- ST: attend rehab and PT LT: exercise 150 minutes of moderate or 70 minutes of vigorous exercise per week with  at least 2 days of weight training.   Sharon Cervantes Name 07/08/20 0071 07/09/20 0859           Exercise Goal Re-Evaluation   Exercise Goals Review Increase Physical Activity;Increase Strength and Stamina Increase Physical Activity;Increase Strength and Stamina;Understanding of Exercise Prescription      Comments Sharon Cervantes is doing PT on M/W in addition to her LW sessions.  Today she is doing the T5 and walking track. Sharon Cervantes is doing ewll in rehab. She is doing fine with T5 NuStep and her knee is feeling better. We will continue to monitor her progress.      Expected Outcomes Short: continue PT and LW Long: increase stamina Short: Continue to use PT for knee Long; Continue to improve stamina.             Discharge Exercise Prescription (Final Exercise Prescription Changes):  Exercise Prescription Changes - 07/09/20 0900      Response to Exercise   Blood Pressure (Admit) 116/70    Blood Pressure (Exercise) 120/66    Blood Pressure (Exit) 110/64    Heart Rate (Admit) 86 bpm    Heart Rate (Exercise) 108 bpm    Heart Rate (Exit) 90 bpm    Oxygen Saturation (Admit) 96 %    Oxygen Saturation (Exercise) 97 %    Oxygen Saturation (Exit) 97 %    Rating of Perceived Exertion (Exercise) 12    Perceived Dyspnea (Exercise) 2    Symptoms knee pain    Duration Continue with 30 min of aerobic exercise without signs/symptoms of physical distress.    Intensity THRR unchanged      Progression   Progression Continue to progress workloads to maintain intensity without signs/symptoms of physical distress.    Average METs 2.66      Resistance Training   Training Prescription Yes    Weight  3lb    Reps 10-15      Interval Training    Interval Training No      Treadmill   MPH 1.9    Grade 1    Minutes 15    METs 2.72      T5 Nustep   Level 5    Minutes 30    METs 2.6      Home Exercise Plan   Plans to continue exercise at Home (comment)   walking, gym   Frequency Add 2 additional days to program exercise sessions.    Initial Home Exercises Provided 05/13/20           Nutrition:  Target Goals: Understanding of nutrition guidelines, daily intake of sodium <1595m, cholesterol <2026m calories 30% from fat and 7% or less from saturated fats, daily to have 5 or more servings of fruits and vegetables.  Education: All About Nutrition: -Group instruction provided by verbal, written material, interactive activities, discussions, models, and posters to present general guidelines for heart healthy nutrition including fat, fiber, MyPlate, the role of sodium in heart healthy nutrition, utilization of the nutrition label, and utilization of this knowledge for meal planning. Follow up email sent as well. Written material given at graduation.   [facility]  Date 07/10/20  [facility] 1- Verbalizes Understanding      Biometrics:  Pre Biometrics - 04/29/20 0909      Pre Biometrics   Height 5' 5.7" (1.669 m)    Weight 251 lb 1.6 oz (113.9 kg)    BMI (Calculated) 40.89  Single Leg Stand 6.47 seconds            Nutrition Therapy Plan and Nutrition Goals:  Nutrition Therapy & Goals - 07/08/20 0849      Nutrition Therapy   Diet heart healthy, low Na    Protein (specify units) 90g    Fiber 25 grams    Whole Grain Foods 3 servings    Saturated Fats 12 max. grams    Fruits and Vegetables 8 servings/day    Sodium 1.5 grams      Personal Nutrition Goals   Nutrition Goal ST; include 2 additional vegetables or fruit in snacks or breakfast, try 3 new vegetables for dinner meals - interested in honey-nut squash LT: include 8 fruits or  vegetables every day (or most days), include a variety (all the colors most weeks)    Comments B: Bagel (cream cheese - sometimes strawberry low-fat) or cinnamon and raisin (peanut butter - no sugar or salt) or scrambled egg (2 eggs) - whole wheat bread dry.  S: pack of peanut butter crackers. L: ham sandwich or pimento cheese sandwich and chips on whole wheat.  S: another pack of peanut butter crackers D: sometimes eat out. At home chicken and green beans (airfrier and frozen vegetables) or another bagel. Doesn't use fat to cook - maybe some oil and garlic, salt and pepper on green beans. No longer in weight watchers. Vegetables: green beans, corn, potatoes, peas, black beans, butter beans. Drink: tea - unsweet. water with crystal light. Coffee x2 - stevia and coffee mate (creamer). Reviewed heart healthy eating and the importance of getting enough vegetables and a variety as well as fat with meals to absorb fat-soluble vitamins.      Intervention Plan   Intervention Prescribe, educate and counsel regarding individualized specific dietary modifications aiming towards targeted core components such as weight, hypertension, lipid management, diabetes, heart failure and other comorbidities.;Nutrition handout(s) given to patient.    Expected Outcomes Short Term Goal: Understand basic principles of dietary content, such as calories, fat, sodium, cholesterol and nutrients.;Short Term Goal: A plan has been developed with personal nutrition goals set during dietitian appointment.;Long Term Goal: Adherence to prescribed nutrition plan.           Nutrition Assessments:  Nutrition Assessments - 04/29/20 0851      MEDFICTS Scores   Pre Score 42          MEDIFICTS Score Key:  ?70 Need to make dietary changes   40-70 Heart Healthy Diet  ? 40 Therapeutic Level Cholesterol Diet   Picture Your Plate Scores:  <33 Unhealthy dietary pattern with much room for improvement.  41-50 Dietary pattern unlikely  to meet recommendations for good health and room for improvement.  51-60 More healthful dietary pattern, with some room for improvement.   >60 Healthy dietary pattern, although there may be some specific behaviors that could be improved.   Nutrition Goals Re-Evaluation:   Nutrition Goals Discharge (Final Nutrition Goals Re-Evaluation):   Psychosocial: Target Goals: Acknowledge presence or absence of significant depression and/or stress, maximize coping skills, provide positive support system. Participant is able to verbalize types and ability to use techniques and skills needed for reducing stress and depression.   Education: Stress, Anxiety, and Depression - Group verbal and visual presentation to define topics covered.  Reviews how body is impacted by stress, anxiety, and depression.  Also discusses healthy ways to reduce stress and to treat/manage anxiety and depression.  Written material given at graduation.  Education: Sleep Hygiene -Provides group verbal and written instruction about how sleep can affect your health.  Define sleep hygiene, discuss sleep cycles and impact of sleep habits. Review good sleep hygiene tips.    Initial Review & Psychosocial Screening:  Initial Psych Review & Screening - 04/29/20 0758      Initial Review   Current issues with Current Stress Concerns;History of Depression;Current Depression;Current Sleep Concerns    Source of Stress Concerns Chronic Illness;Financial    Comments Sharon Cervantes is 4, no feeling of depression currently, not sleeping great 4-5 hours a night (going to bed late and up early), finances have gotten better      [place]? Yes   Sharon Cervantes lives near by, [place] friend April (moving this weekend)   Concerns Inappropriate over/under dependence on family/friends      Barriers   Psychosocial barriers to participate in program The patient should benefit from training in stress management and  relaxation.;Psychosocial barriers identified (see note)      Screening Interventions   Interventions Provide feedback about the scores to participant;To provide support and resources with identified psychosocial needs;Encouraged to exercise    Expected Outcomes Short Term goal: Utilizing psychosocial counselor, staff and physician to assist with identification of specific Stressors or current issues interfering with healing process. Setting desired goal for each stressor or current issue identified.;Long Term Goal: Stressors or current issues are controlled or eliminated.;Short Term goal: Identification and review with participant of any Quality of Life or Depression concerns found by scoring the questionnaire.;Long Term goal: The participant improves quality of Life and PHQ9 Scores as seen by post scores and/or verbalization of changes           Quality of Life Scores:  Scores of 19 and below usually indicate a poorer quality of life in these areas.  A difference of  2-3 points is a clinically meaningful difference.  A difference of 2-3 points in the total score of the Quality of Life Index has been associated with significant improvement in overall quality of life, self-image, physical symptoms, and general health in studies assessing change in quality of life.  Sharon Cervantes-9: Recent Review Flowsheet Data    Depression screen [facility] 2/9 05/13/2020 04/29/2020 12/24/2019 12/10/2019 10/29/2019   Decreased Interest 0 0 0 2 0   Down, Depressed, Hopeless 0 0 0 3 0   Sharon Cervantes - 2 Score 0 0 0 5 0   Altered sleeping _0 0   Tired, decreased energy _1 0   Change in appetite 1 0 0 0 0   Feeling bad or failure about yourself  0 0 0 1 0   Trouble concentrating _2 0   Moving slowly or fidgety/restless 0 0 0 0 0   Suicidal thoughts 0 0 0 0 0   Sharon Cervantes-9 Score _3 0   Difficult doing work/chores Somewhat difficult Somewhat difficult Not difficult at all Somewhat difficult Not difficult at all      Interpretation of Total Score  Total Score Depression Severity:  1-4 = Minimal depression, 5-9 = Mild depression, 10-14 = Moderate depression, 15-19 = Moderately severe depression, 20-27 = Severe depression   Psychosocial Evaluation and Intervention:  Psychosocial Evaluation - 04/29/20 0800      Psychosocial Evaluation & Interventions   Interventions Stress management education;Encouraged to exercise with the program and follow exercise prescription    Comments Sharon Cervantes is coming back to  pulmonary rehab with her asthma that has gotten worse since surgery.  She wants to focus on getting on her nebulizer and get back to walking longer.  She has a few new friends, but one is moving away this weekend.  She wants to get her weight back down too. Her finances are doing better.  Sleep continues to struggle for her.    Expected Outcomes Short: Attend rehab to improve breathing and endurance  Long: Continue to focus on positive and use mental boost    Continue Psychosocial Services  Follow up required by staff           Psychosocial Re-Evaluation:  Psychosocial Re-Evaluation    Row Name 05/13/20 0757 07/01/20 0814 07/08/20 6546         Psychosocial Re-Evaluation   Current issues with Current Stress Concerns;Current Sleep Concerns History of Depression;Current Psychotropic Meds;Current Depression;Current Sleep Concerns --     Comments Margrete is doing well in rehab.  She is sleeping pretty good, but still is staying up late.  She is trying to get better.  Her cat is doing better to with sleep.  She is going to get started on her home exercise again.  Her Sharon Cervantes is the same, but shifting around some. She is on Effexor to manage depression. She Cervantes no symptoms of depression in last couple of weeks and does not go to talk therapy. She Cervantes that she can't find anyone for talk therapy. Discussed some options to look for therapists including [facility] since she is not having luck finding anyone in  person who is taking new patients. She will read, crochet to reduce stress. She Cervantes getting 4-5 hours of sleep each night - sleep maintenance insomia - tried melatonin, but didn't see any difference. Discussed sleep hygiene. Sharon Cervantes says she has good and bad days as far as depression.  She still is on Effexor.  She has spoken to her Dr about sleep and has a prescirption for trazadone but has only tried it once.  We discussed trying some realxing music if she wakes up to help her get back to sleep.     Expected Outcomes Short: Get back to exercise routine for mood boost  Long: Continue to focus on positive. ST: Find therapist, try sleep hygiene techniques. LT: continue to manage depression, sleep throughout the night. Short: try music to help with sleep long: develop better sleep patterns     Interventions Encouraged to attend Pulmonary Rehabilitation for the exercise Encouraged to attend Pulmonary Rehabilitation for the exercise --     Continue Psychosocial Services  Follow up required by staff Follow up required by staff --     Comments -- She Cervantes no current stress concerns, she is still working to manage her depression and sleep. --            Psychosocial Discharge (Final Psychosocial Re-Evaluation):  Psychosocial Re-Evaluation - 07/08/20 0822      Psychosocial Re-Evaluation   Comments Sharon Cervantes says she has good and bad days as far as depression.  She still is on Effexor.  She has spoken to her Dr about sleep and has a prescirption for trazadone but has only tried it once.  We discussed trying some realxing music if she wakes up to help her get back to sleep.    Expected Outcomes Short: try music to help with sleep long: develop better sleep patterns           Education: Education Goals: Education classes will be  provided on a weekly basis, covering required topics. Participant will state understanding/return demonstration of topics presented.  Learning Barriers/Preferences:   Learning Barriers/Preferences - 04/29/20 0803      Learning Barriers/Preferences   Learning Barriers None    Learning Preferences None           General Pulmonary Education Topics:  Infection Prevention: - Provides verbal and written material to individual with discussion of infection control including proper hand washing and proper equipment cleaning during exercise session.   Pulmonary Rehab from 07/10/2020 in [facility] Cardiac and Pulmonary Rehab  Date 04/29/20  Educator [facility]  Instruction Review Code 1- Verbalizes Understanding      Falls Prevention: - Provides verbal and written material to individual with discussion of falls prevention and safety.   Pulmonary Rehab from 07/10/2020 in [facility] Cardiac and Pulmonary Rehab  Date 04/29/20  Educator [facility]  Instruction Review Code 1- Verbalizes Understanding      Chronic Lung Disease Review: - Group verbal instruction with posters, models, PowerPoint presentations and videos,  to review new updates, new respiratory medications, new advancements in procedures and treatments. Providing information on websites and "800" numbers for continued self-education. Includes information about supplement oxygen, available portable oxygen systems, continuous and intermittent flow rates, oxygen safety, concentrators, and Medicare reimbursement for oxygen. Explanation of Pulmonary Drugs, including class, frequency, complications, importance of spacers, rinsing mouth after steroid MDI's, and proper cleaning methods for nebulizers. Review of basic lung anatomy and physiology related to function, structure, and complications of lung disease. Review of risk factors. Discussion about methods for diagnosing sleep apnea and types of masks and machines for OSA. Includes a review of the use of types of environmental controls: home humidity, furnaces, filters, dust mite/pet prevention, HEPA vacuums. Discussion about weather changes, air quality and the benefits of nasal  washing. Instruction on Warning signs, infection symptoms, calling MD promptly, preventive modes, and value of vaccinations. Review of effective airway clearance, coughing and/or vibration techniques. Emphasizing that all should Create an Action Plan. Written material given at graduation.   Pulmonary Rehab from 07/10/2020 in [facility] Cardiac and Pulmonary Rehab  Date 04/29/20  Instruction Review Code 3- Needs Reinforcement  [need identified]      AED/CPR: - Group verbal and written instruction with the use of models to demonstrate the basic use of the AED with the basic ABC's of resuscitation.    Anatomy and Cardiac Procedures: - Group verbal and visual presentation and models provide information about basic cardiac anatomy and function. Reviews the testing methods done to diagnose heart disease and the outcomes of the test results. Describes the treatment choices: Medical Management, Angioplasty, or Coronary Bypass Surgery for treating various heart conditions including Myocardial Infarction, Angina, Valve Disease, and Cardiac Arrhythmias.  Written material given at graduation.   Medication Safety: - Group verbal and visual instruction to review commonly prescribed medications for heart and lung disease. Reviews the medication, class of the drug, and side effects. Includes the steps to properly store meds and maintain the prescription regimen.  Written material given at graduation.   Other: -Provides group and verbal instruction on various topics (see comments)   Knowledge Questionnaire Score:  Knowledge Questionnaire Score - 04/29/20 0851      Knowledge Questionnaire Score   Pre Score 16/18 Education Focus: O2 safety            Core Components/Risk Factors/Patient Goals at Admission:  Personal Goals and Risk Factors at Admission - 04/29/20 0803      Core  Components/Risk Factors/Patient Goals on Admission    Weight Management Yes;Obesity;Weight Loss    Intervention Weight  Management: Develop a combined nutrition and exercise program designed to reach desired caloric intake, while maintaining appropriate intake of nutrient and fiber, sodium and fats, and appropriate energy expenditure required for the weight goal.;Weight Management/Obesity: Establish reasonable short term and long term weight goals.;Weight Management: Provide education and appropriate resources to help participant work on and attain dietary goals.;Obesity: Provide education and appropriate resources to help participant work on and attain dietary goals.    Admit Weight 251 lb 1.6 oz (113.9 kg)    Goal Weight: Short Term 245 lb (111.1 kg)    Goal Weight: Long Term 240 lb (108.9 kg)    Expected Outcomes Short Term: Continue to assess and modify interventions until short term weight is achieved;Long Term: Adherence to nutrition and physical activity/exercise program aimed toward attainment of established weight goal;Weight Loss: Understanding of general recommendations for a balanced deficit meal plan, which promotes 1-2 lb weight loss per week and includes a negative energy balance of [phone] kcal/d;Understanding recommendations for meals to include 15-35% energy as protein, 25-35% energy from fat, 35-60% energy from carbohydrates, less than 270m of dietary cholesterol, 20-35 gm of total fiber daily;Understanding of distribution of calorie intake throughout the day with the consumption of 4-5 meals/snacks    Improve shortness of breath with ADL's Yes    Intervention Provide education, individualized exercise plan and daily activity instruction to help decrease symptoms of SOB with activities of daily living.    Expected Outcomes Short Term: Improve cardiorespiratory fitness to achieve a reduction of symptoms when performing ADLs;Long Term: Be able to perform more ADLs without symptoms or delay the onset of symptoms    Hypertension Yes    Intervention Provide education on lifestyle modifcations including  regular physical activity/exercise, weight management, moderate sodium restriction and increased consumption of fresh fruit, vegetables, and low fat dairy, alcohol moderation, and smoking cessation.;Monitor prescription use compliance.    Expected Outcomes Long Term: Maintenance of blood pressure at goal levels.;Short Term: Continued assessment and intervention until BP is < 140/96mHG in hypertensive participants. < 130/8099mG in hypertensive participants with diabetes, heart failure or chronic Cervantes disease.    Lipids Yes    Intervention Provide education and support for participant on nutrition & aerobic/resistive exercise along with prescribed medications to achieve LDL <70m69mDL >40mg12m Expected Outcomes Short Term: Participant states understanding of desired cholesterol values and is compliant with medications prescribed. Participant is following exercise prescription and nutrition guidelines.;Long Term: Cholesterol controlled with medications as prescribed, with individualized exercise RX and with personalized nutrition plan. Value goals: LDL < 70mg,55m > 40 mg.           Education:Diabetes - Individual verbal and written instruction to review signs/symptoms of diabetes, desired ranges of glucose level fasting, after meals and with exercise. Acknowledge that pre and post exercise glucose checks will be done for 3 sessions at entry of program.   Know Your Numbers and Heart Failure: - Group verbal and visual instruction to discuss disease risk factors for cardiac and pulmonary disease and treatment options.  Reviews associated critical values for Overweight/Obesity, Hypertension, Cholesterol, and Diabetes.  Discusses basics of heart failure: signs/symptoms and treatments.  Introduces Heart Failure Zone chart for action plan for heart failure.  Written material given at graduation.   Core Components/Risk Factors/Patient Goals Review:   Goals and Risk Factor Review    Row Name  05/13/20  0800 07/01/20 0826 07/08/20 0809         Core Components/Risk Factors/Patient Goals Review   Personal Goals Review Weight Management/Obesity;Improve shortness of breath with ADL's;Hypertension;Lipids Weight Management/Obesity;Improve shortness of breath with ADL's;Hypertension Weight Management/Obesity;Improve shortness of breath with ADL's;Hypertension     Review Sharon Cervantes is off to a good start in rehab.  Her weight was up today as she was eating over the weekend.  She is determined to get back to exercise to help curb her eating.  Blood pressures have been doing good in class but she is not checking at home since they have been doing better.  Her breathing is still a bit of a stuggle recently. She is trying to get her neublizer im She Cervantes her weight has been stable - has not met with RD regarding nutrition yet. She Cervantes SOB is good unless she has a coughing spell - hasn't had to use her inhaler in a week or two. She only takes nebulizer in morning because it makes her shake. She Cervantes her BP has been good at rehab, she doesn't have a BP cuff , but will buy one. SOB has been ok lately except when she has a coughing spell.  She is doing PT for knee and was advised not to do TM for now, but can walk on track.  She is taking meds as directed.  She is going to order a BP cuff ffrom  today.     Expected Outcomes Short: Get back to nebulizer  Long: Work on weight loss. ST: Get BP cuff, continue to take nebulizer. LT: meet with RD Short: order BP cuff, meet with RD today Long: monitor BP at home, follow RD recommendations            Core Components/Risk Factors/Patient Goals at Discharge (Final Review):   Goals and Risk Factor Review - 07/08/20 0809      Core Components/Risk Factors/Patient Goals Review   Personal Goals Review Weight Management/Obesity;Improve shortness of breath with ADL's;Hypertension    Review SOB has been ok lately except when she has a coughing spell.  She is doing PT  for knee and was advised not to do TM for now, but can walk on track.  She is taking meds as directed.  She is going to order a BP cuff ffrom [facility] today.    Expected Outcomes Short: order BP cuff, meet with RD today Long: monitor BP at home, follow RD recommendations           ITP Comments:  ITP Comments    Row Name 04/29/20 0805 05/01/20 0755 05/06/20 0717 05/07/20 1548 05/22/20 0746   ITP Comments Completed 6MWT and gym orientation. Initial ITP created and sent for review to Dr. Emily Filbert, Medical Director. Documentaiton for diagnosis can be found in Care Everywhere encounter 04/10/2020. First full day of exercise!  Patient was oriented to gym and equipment including functions, settings, policies, and procedures.  Patient's individual exercise prescription and treatment plan were reviewed.  All starting workloads were established based on the results of the 6 minute walk test done at initial orientation visit.  The plan for exercise progression was also introduced and progression will be customized based on patient's performance and goals pt fell and hurt knee at home yesterday 30 day review completed. ITP sent to Dr. Emily Filbert, Medical Director of Cardiac and Pulmonary Rehab. Continue with ITP unless changes are made by physician. Sharon Cervantes saw the doctor yesterday and they started her  on prednisone for her knee.  She is also supposed to stay off the treadmill for 3 weeks as well.   [place] Name 06/04/20 0548 06/10/20 1631 06/17/20 0719 06/25/20 1453 07/02/20 0640   ITP Comments 30 Day review completed. Medical Director ITP review done, changes made as directed, and signed approval by Medical Director. Dalyah has called out the past couple of weeks with knee pain.  She is supposed to see her doctor this week to see what is going on.  She will be out of town on Thursday. Eckley called and said she will not be here for the next two weeks.  She also left message that her physician has started her with PT  for her knee. Pt has not attended since last review. 30 Day review completed. Medical Director ITP review done, changes made as directed, and signed approval by Medical Director.   B and E Name 07/22/20 1304 07/22/20 1531 07/30/20 0940       ITP Comments Pt has not attended since last review. Synda will have surgery for a hernia tomorrow.  She will let staff know when she is cleared to return to exercise. 30 Day review completed. Medical Director ITP review done, changes made as directed, and signed approval by Medical Director.            Comments:

## 2020-07-31 ENCOUNTER — Encounter: Payer: Medicare Other | Attending: Pulmonary Disease

## 2020-07-31 DIAGNOSIS — J4550 Severe persistent asthma, uncomplicated: Secondary | ICD-10-CM | POA: Insufficient documentation

## 2020-07-31 DIAGNOSIS — I272 Pulmonary hypertension, unspecified: Secondary | ICD-10-CM | POA: Insufficient documentation

## 2020-08-06 ENCOUNTER — Telehealth: Payer: Self-pay | Admitting: *Deleted

## 2020-08-06 ENCOUNTER — Encounter: Payer: Self-pay | Admitting: *Deleted

## 2020-08-06 DIAGNOSIS — J4550 Severe persistent asthma, uncomplicated: Secondary | ICD-10-CM

## 2020-08-06 NOTE — Telephone Encounter (Signed)
Called to check on patient.  She saw surgeon yesterday and still having fluid built up around knee.  They want her to stay off her knee for about 4 weeks. They may have to drain it if it doesn't improve.  We will cancel appointments through the end of the year and she hopes to be able to return in January.

## 2020-08-19 ENCOUNTER — Other Ambulatory Visit: Payer: Self-pay | Admitting: Surgery

## 2020-08-19 DIAGNOSIS — K430 Incisional hernia with obstruction, without gangrene: Secondary | ICD-10-CM

## 2020-08-27 ENCOUNTER — Encounter: Payer: Self-pay | Admitting: *Deleted

## 2020-08-27 ENCOUNTER — Other Ambulatory Visit: Payer: Self-pay

## 2020-08-27 ENCOUNTER — Ambulatory Visit
Admission: RE | Admit: 2020-08-27 | Discharge: 2020-08-27 | Disposition: A | Discharge status: Home / Self Care | Payer: Medicare Other | Source: Ambulatory Visit | Attending: Surgery | Admitting: Surgery

## 2020-08-27 DIAGNOSIS — J4550 Severe persistent asthma, uncomplicated: Secondary | ICD-10-CM

## 2020-08-27 DIAGNOSIS — K430 Incisional hernia with obstruction, without gangrene: Secondary | ICD-10-CM

## 2020-08-27 LAB — POCT I-STAT CREATININE: Creatinine, Ser: 0.9 mg/dL (ref 0.44–1.00)

## 2020-08-27 MED ORDER — IOHEXOL 300 MG/ML  SOLN
100.0000 mL | Freq: Once | INTRAMUSCULAR | Status: AC | PRN
  Administered 2020-08-27: 100 mL via INTRAVENOUS

## 2020-08-27 NOTE — Progress Notes (Signed)
Pulmonary Individual Treatment Plan  Patient Details  Name: Sharon Cervantes MRN: [id number] Date of Birth: 02/19/1951 Referring Provider:   Flowsheet Row Pulmonary Rehab from 04/29/2020 in [facility]  Referring Provider Aleskerov, Fred MD      Initial Encounter Date:  Flowsheet Row Pulmonary Rehab from 04/29/2020 in [facility]  Date 04/29/20      Visit Diagnosis: Severe persistent asthma, unspecified whether complicated  Patient's Home Medications on Admission:  Current Outpatient Medications:  .  albuterol (PROVENTIL HFA;VENTOLIN HFA) 108 (90 Base) MCG/ACT inhaler, Inhale 2 puffs into the lungs every 6 (six) hours as needed for wheezing or shortness of breath. , Disp: , Rfl:  .  albuterol (PROVENTIL) (2.5 MG/3ML) 0.083% nebulizer solution, Take 2.5 mg by nebulization 2 (two) times daily as needed for wheezing or shortness of breath., Disp: , Rfl:  .  budesonide (PULMICORT) 0.5 MG/2ML nebulizer solution, Take 0.5 mg by nebulization 2 (two) times daily as needed (shortness of breath or wheezing). , Disp: , Rfl:  .  cyanocobalamin (,VITAMIN B-12,) 1000 MCG/ML injection, Inject 1,000 mcg into the muscle every 30 (thirty) days., Disp: , Rfl:  .  diclofenac (VOLTAREN) 50 MG EC tablet, Take 50 mg by mouth 2 (two) times daily., Disp: , Rfl:  .  diclofenac Sodium (VOLTAREN) 1 % GEL, Apply 1 application topically 4 (four) times daily as needed (pain)., Disp: , Rfl:  .  esomeprazole (NEXIUM) 40 MG capsule, Take 40 mg by mouth daily. , Disp: , Rfl:  .  FLUoxetine (PROZAC) 10 MG tablet, Take 10 mg by mouth daily., Disp: , Rfl:  .  furosemide (LASIX) 20 MG tablet, Take 20 mg by mouth daily as needed for edema., Disp: , Rfl:  .  gabapentin (NEURONTIN) 300 MG capsule, Take 300 mg by mouth 3 (three) times daily. , Disp: , Rfl:  .  hydrochlorothiazide (HYDRODIURIL) 12.5 MG tablet, Take 12.5 mg by mouth daily. , Disp: , Rfl:  .  HYDROcodone-acetaminophen (NORCO)  5-325 MG tablet, Take 1 tablet by mouth every 6 (six) hours as needed for up to 6 doses for moderate pain., Disp: 6 tablet, Rfl: 0 .  HYDROcodone-homatropine (HYCODAN) 5-1.5 MG/5ML syrup, Take 5 mLs by mouth every 6 (six) hours as needed for cough., Disp: , Rfl:  .  ibuprofen (ADVIL) 200 MG tablet, Take 800 mg by mouth every 6 (six) hours as needed for headache or moderate pain., Disp: , Rfl:  .  montelukast (SINGULAIR) 10 MG tablet, Take 1 tablet (10 mg total) by mouth at bedtime. appt further refills (Patient taking differently: Take 10 mg by mouth at bedtime. ), Disp: 90 tablet, Rfl: 1 .  oxymetazoline (AFRIN) 0.05 % nasal spray, Place 1 spray into both nostrils daily as needed (nose bleeds)., Disp: , Rfl:  .  pramipexole (MIRAPEX) 1.5 MG tablet, Take 1.5 mg by mouth at bedtime. , Disp: , Rfl:  .  traMADol (ULTRAM) 50 MG tablet, Take 50 mg by mouth every 8 (eight) hours as needed for moderate pain., Disp: , Rfl:  .  traZODone (DESYREL) 100 MG tablet, Take 100 mg by mouth at bedtime as needed for sleep., Disp: , Rfl:  .  venlafaxine XR (EFFEXOR-XR) 75 MG 24 hr capsule, Take 150 mg by mouth daily with breakfast. , Disp: , Rfl:  No current facility-administered medications for this visit.  Facility-Administered Medications Ordered in Other Visits:  .  [COMPLETED] sodium chloride 0.9 % nebulizer solution 3 mL, 3 mL,   Nebulization, Once, 3 mL at 10/03/18 1130 **FOLLOWED BY** [COMPLETED] methacholine (PROVOCHOLINE) inhaler solution 0.125 mg, 2 mL, Inhalation, Once, 0.125 mg at 10/03/18 1139 **FOLLOWED BY** methacholine (PROVOCHOLINE) inhaler solution 0.5 mg, 2 mL, Inhalation, Once **FOLLOWED BY** methacholine (PROVOCHOLINE) inhaler solution 2 mg, 2 mL, Inhalation, Once **FOLLOWED BY** methacholine (PROVOCHOLINE) inhaler solution 8 mg, 2 mL, Inhalation, Once **FOLLOWED BY** methacholine (PROVOCHOLINE) inhaler solution 32 mg, 2 mL, Inhalation, Once **FOLLOWED BY** [COMPLETED] albuterol (PROVENTIL) (2.5 MG/3ML)  0.083% nebulizer solution 2.5 mg, 2.5 mg, Nebulization, Once, Ottie Glazier, MD, 2.5 mg at 10/03/18 1146  Past Medical History: Past Medical History:  Diagnosis Date  . Arthritis   . Asthma   . Chronic cough   . Colon polyps   . Depression   . GERD (gastroesophageal reflux disease)   . Hiatal hernia    LARGE  . History of chicken pox   . Hypertension   . Macular degeneration   . Multinodular goiter    FNA in past neg h/o thyroid cysts  . Pernicious anemia    PERNICIOUS   . RLS (restless legs syndrome)   . Sleep apnea    NO CPAP    Tobacco Use: Social History   Tobacco Use  Smoking Status Never Smoker  Smokeless Tobacco Never Used    Labs: Recent Review Scientist, physiological    Labs for ITP Cardiac and Pulmonary Rehab Latest Ref Rng & Units 10/12/2018   Hemoglobin A1c 4.6 - 6.5 % 5.6       Pulmonary Assessment Scores:  Pulmonary Assessment Scores    Row Name 04/29/20 0834         ADL UCSD   ADL Phase Entry     SOB Score total 29     Rest 0     Walk 0     Stairs 5     Bath 0     Dress 0     Shop 1           CAT Score   CAT Score 26           mMRC Score   mMRC Score 1            UCSD: Self-administered rating of dyspnea associated with activities of daily living (ADLs) 6-point scale (0 = "not at all" to 5 = "maximal or unable to do because of breathlessness")  Scoring Scores range from 0 to 120.  Minimally important difference is 5 units  CAT: CAT can identify the health impairment of COPD patients and is better correlated with disease progression.  CAT has a scoring range of zero to 40. The CAT score is classified into four groups of low (less than 10), medium (10 - 20), high (21-30) and very high (31-40) based on the impact level of disease on health status. A CAT score over 10 suggests significant symptoms.  A worsening CAT score could be explained by an exacerbation, poor medication adherence, poor inhaler technique, or progression of COPD or  comorbid conditions.  CAT MCID is 2 points  mMRC: mMRC (Modified Medical Research Council) Dyspnea Scale is used to assess the degree of baseline functional disability in patients of respiratory disease due to dyspnea. No minimal important difference is established. A decrease in score of 1 point or greater is considered a positive change.   Pulmonary Function Assessment:  Pulmonary Function Assessment - 04/29/20 0756      Breath   Bilateral Breath Sounds Clear    Shortness of Breath  No;Limiting activity           Exercise Target Goals: Exercise Program Goal: Individual exercise prescription set using results from initial 6 min walk test and THRR while considering  patient's activity barriers and safety.   Exercise Prescription Goal: Initial exercise prescription builds to 30-45 minutes a day of aerobic activity, 2-3 days per week.  Home exercise guidelines will be given to patient during program as part of exercise prescription that the participant will acknowledge.  Education: Aerobic Exercise: - Group verbal and visual presentation on the components of exercise prescription. Introduces F.I.T.T principle from ACSM for exercise prescriptions.  Reviews F.I.T.T. principles of aerobic exercise including progression. Written material given at graduation.   Education: Resistance Exercise: - Group verbal and visual presentation on the components of exercise prescription. Introduces F.I.T.T principle from ACSM for exercise prescriptions  Reviews F.I.T.T. principles of resistance exercise including progression. Written material given at graduation.    Education: Exercise & Equipment Safety: - Individual verbal instruction and demonstration of equipment use and safety with use of the equipment. Flowsheet Row Pulmonary Rehab from 07/10/2020 in [facility]  Date 04/29/20  Educator [facility]  Instruction Review Code 1- Verbalizes Understanding      Education: Exercise  Physiology & General Exercise Guidelines: - Group verbal and written instruction with models to review the exercise physiology of the cardiovascular system and associated critical values. Provides general exercise guidelines with specific guidelines to those with heart or lung disease.    Education: Flexibility, Balance, Mind/Body Relaxation: - Group verbal and visual presentation with interactive activity on the components of exercise prescription. Introduces F.I.T.T principle from ACSM for exercise prescriptions. Reviews F.I.T.T. principles of flexibility and balance exercise training including progression. Also discusses the mind body connection.  Reviews various relaxation techniques to help reduce and manage stress (i.e. Deep breathing, progressive muscle relaxation, and visualization). Balance handout provided to take home. Written material given at graduation. Flowsheet Row Pulmonary Rehab from 07/10/2020 in [facility]  Date 07/03/20  Educator AS  Instruction Review Code 1- Verbalizes Understanding      Activity Barriers & Risk Stratification:  Activity Barriers & Cardiac Risk Stratification - 04/29/20 0757      Activity Barriers & Cardiac Risk Stratification   Activity Barriers Deconditioning;Joint Problems;Other (comment);Muscular Weakness;Arthritis;Shortness of Breath;Right Knee Replacement;Balance Concerns    Comments TKR completed July 2021           6 Minute Walk:  6 Minute Walk    Row Name 04/29/20 0903         6 Minute Walk   Phase Initial     Distance 1320 feet     Walk Time 6 minutes     # of Rest Breaks 0     MPH 2.5     METS 2.34     RPE 13     Perceived Dyspnea  2     VO2 Peak 8.2     Symptoms Yes (comment)     Comments SOB     Resting HR 76 bpm     Resting BP 134/74     Resting Oxygen Saturation  95 %     Exercise Oxygen Saturation  during 6 min walk 89 %     Max Ex. HR 108 bpm     Max Ex. BP 144/66     2 Minute Post BP 136/70            Interval HR  1 Minute HR 100     2 Minute HR 98     3 Minute HR 100     4 Minute HR 106     5 Minute HR 101     6 Minute HR 108     2 Minute Post HR 86     Interval Heart Rate? Yes           Interval Oxygen   Interval Oxygen? Yes     Baseline Oxygen Saturation % 95 %     1 Minute Oxygen Saturation % 91 %     1 Minute Liters of Oxygen 0 L  Room Air     2 Minute Oxygen Saturation % 89 %     2 Minute Liters of Oxygen 0 L     3 Minute Oxygen Saturation % 92 %     3 Minute Liters of Oxygen 0 L     4 Minute Oxygen Saturation % 93 %     4 Minute Liters of Oxygen 0 L     5 Minute Oxygen Saturation % 94 %     5 Minute Liters of Oxygen 0 L     6 Minute Oxygen Saturation % 94 %     6 Minute Liters of Oxygen 0 L     2 Minute Post Oxygen Saturation % 96 %     2 Minute Post Liters of Oxygen 0 L           Oxygen Initial Assessment:  Oxygen Initial Assessment - 04/29/20 0756      Home Oxygen   Home Oxygen Device None    Sleep Oxygen Prescription None    Home Exercise Oxygen Prescription None    Home Resting Oxygen Prescription None      Initial 6 min Walk   Oxygen Used None      Program Oxygen Prescription   Program Oxygen Prescription None      Intervention   Short Term Goals To learn and understand importance of monitoring SPO2 with pulse oximeter and demonstrate accurate use of the pulse oximeter.;To learn and understand importance of maintaining oxygen saturations>88%;To learn and demonstrate proper pursed lip breathing techniques or other breathing techniques.;To learn and demonstrate proper use of respiratory medications    Long  Term Goals Verbalizes importance of monitoring SPO2 with pulse oximeter and return demonstration;Maintenance of O2 saturations>88%;Exhibits proper breathing techniques, such as pursed lip breathing or other method taught during program session;Compliance with respiratory medication;Demonstrates proper use of MDI's           Oxygen  Re-Evaluation:  Oxygen Re-Evaluation    Row Name 05/13/20 0802 07/01/20 0813           Program Oxygen Prescription   Program Oxygen Prescription None None             Home Oxygen   Home Oxygen Device None None      Sleep Oxygen Prescription None CPAP  She has not recieved it yet      Home Exercise Oxygen Prescription None None      Home Resting Oxygen Prescription None None             Goals/Expected Outcomes   Short Term Goals To learn and understand importance of monitoring SPO2 with pulse oximeter and demonstrate accurate use of the pulse oximeter.;To learn and understand importance of maintaining oxygen saturations>88%;To learn and demonstrate proper pursed lip breathing techniques or other breathing techniques.;To learn and demonstrate proper use  of respiratory medications To learn and understand importance of monitoring SPO2 with pulse oximeter and demonstrate accurate use of the pulse oximeter.;To learn and understand importance of maintaining oxygen saturations>88%;To learn and demonstrate proper pursed lip breathing techniques or other breathing techniques.;To learn and demonstrate proper use of respiratory medications      Long  Term Goals Verbalizes importance of monitoring SPO2 with pulse oximeter and return demonstration;Maintenance of O2 saturations>88%;Exhibits proper breathing techniques, such as pursed lip breathing or other method taught during program session;Compliance with respiratory medication;Demonstrates proper use of MDI's Verbalizes importance of monitoring SPO2 with pulse oximeter and return demonstration;Maintenance of O2 saturations>88%;Exhibits proper breathing techniques, such as pursed lip breathing or other method taught during program session;Compliance with respiratory medication;Demonstrates proper use of MDI's      Comments Sharon Cervantes is struggling with her breathing.  She is supposed to use her nebulizer twice a day but only getting once a day.  She is trying  to use her pursed lip breathing some.  She coughing more and needs her inhaler more often.  She is going to try to use her nebulizer the twice a day. Her sats have been good. Sharon Cervantes reports trying to remember to due pursed lip breathing, but it doesn't feel natural still. She takes her nebulizer in the morning, but not at night due to shaking.      Goals/Expected Outcomes Short: Nebulizer twice a day Long: Conitnue to work on improving breathing. ST: continue to practice PLB LT: use PLB during activity.             Oxygen Discharge (Final Oxygen Re-Evaluation):  Oxygen Re-Evaluation - 07/01/20 0813      Program Oxygen Prescription   Program Oxygen Prescription None      Home Oxygen   Home Oxygen Device None    Sleep Oxygen Prescription CPAP   She has not recieved it yet   Home Exercise Oxygen Prescription None    Home Resting Oxygen Prescription None      Goals/Expected Outcomes   Short Term Goals To learn and understand importance of monitoring SPO2 with pulse oximeter and demonstrate accurate use of the pulse oximeter.;To learn and understand importance of maintaining oxygen saturations>88%;To learn and demonstrate proper pursed lip breathing techniques or other breathing techniques.;To learn and demonstrate proper use of respiratory medications    Long  Term Goals Verbalizes importance of monitoring SPO2 with pulse oximeter and return demonstration;Maintenance of O2 saturations>88%;Exhibits proper breathing techniques, such as pursed lip breathing or other method taught during program session;Compliance with respiratory medication;Demonstrates proper use of MDI's    Comments Sharon Cervantes reports trying to remember to due pursed lip breathing, but it doesn't feel natural still. She takes her nebulizer in the morning, but not at night due to shaking.    Goals/Expected Outcomes ST: continue to practice PLB LT: use PLB during activity.           Initial Exercise Prescription:  Initial  Exercise Prescription - 04/29/20 0900      Date of Initial Exercise RX and Referring Provider   Date 04/29/20    Referring Provider Claudette Stapler MD      Treadmill   MPH 2    Grade 0.5    Minutes 15    METs 2.67      REL-XR   Level 1    Speed 50    Minutes 15    METs 2.6      T5 Nustep   Level 2  SPM 80    Minutes 15    METs 2.6      Prescription Details   Frequency (times per week) 2    Duration Progress to 30 minutes of continuous aerobic without signs/symptoms of physical distress      Intensity   THRR 40-80% of Max Heartrate 106-135    Ratings of Perceived Exertion 11-13    Perceived Dyspnea 0-4      Progression   Progression Continue to progress workloads to maintain intensity without signs/symptoms of physical distress.      Resistance Training   Training Prescription Yes    Weight 3 lb    Reps 10-15           Perform Capillary Blood Glucose checks as needed.  Exercise Prescription Changes:  Exercise Prescription Changes    Row Name 04/29/20 0900 05/13/20 1500 05/26/20 1800 07/09/20 0900       Response to Exercise   Blood Pressure (Admit) 134/74 120/72 122/66 116/70    Blood Pressure (Exercise) 144/66 136/74 132/78 120/66    Blood Pressure (Exit) 128/60 124/70 122/74 110/64    Heart Rate (Admit) 76 bpm 74 bpm 88 bpm 86 bpm    Heart Rate (Exercise) 108 bpm 89 bpm 97 bpm 108 bpm    Heart Rate (Exit) 86 bpm 85 bpm 90 bpm 90 bpm    Oxygen Saturation (Admit) 95 % 97 % 94 % 96 %    Oxygen Saturation (Exercise) 89 % 94 % 96 % 97 %    Oxygen Saturation (Exit) 94 % 95 % 95 % 97 %    Rating of Perceived Exertion (Exercise) 13 13 14 12     Perceived Dyspnea (Exercise) 2 3 1 2     Symptoms SOB SOB -- knee pain    Comments walk test results -- -- --    Duration -- Continue with 30 min of aerobic exercise without signs/symptoms of physical distress. Continue with 30 min of aerobic exercise without signs/symptoms of physical distress. Continue with 30 min of  aerobic exercise without signs/symptoms of physical distress.    Intensity -- THRR unchanged THRR unchanged THRR unchanged         Progression   Progression -- Continue to progress workloads to maintain intensity without signs/symptoms of physical distress. Continue to progress workloads to maintain intensity without signs/symptoms of physical distress. Continue to progress workloads to maintain intensity without signs/symptoms of physical distress.    Average METs -- 2.38 2.3 2.66         Resistance Training   Training Prescription -- Yes Yes Yes    Weight -- 3 lb  3lb  3lb    Reps -- 10-15 10-15 10-15         Interval Training   Interval Training -- No No No         Treadmill   MPH -- 2.2 -- 1.9    Grade -- 0.5 -- 1    Minutes -- 15 -- 15    METs -- 2.84 -- 2.72         REL-XR   Level -- 1 3 --    Speed -- -- 50 --    Minutes -- 15 15 --    METs -- 2 -- --         T5 Nustep   Level -- 5 3 5     SPM -- -- 80 --    Minutes -- 15 15 30     METs -- 2.3  2.3 2.6         Home Exercise Plan   Plans to continue exercise at -- Home (comment)  walking, gym -- Home (comment)  walking, gym    Frequency -- Add 2 additional days to program exercise sessions. -- Add 2 additional days to program exercise sessions.    Initial Home Exercises Provided -- 05/13/20 -- 05/13/20           Exercise Comments:  Exercise Comments    Row Name 05/22/20 0747 06/17/20 0719         Exercise Comments Sharon Cervantes saw the doctor yesterday and they started her on prednisone for her knee.  She is also supposed to stay off the treadmill for 3 weeks as well.  We also talked about making sure she continues to exercise to stay out of her depression. [place] called and said she will not be here for the next two weeks.  She also left message that her physician has started her with PT for her knee.             Exercise Goals and Review:  Exercise Goals    Row Name 04/29/20 0909             Exercise  Goals   Increase Physical Activity Yes       Intervention Provide advice, education, support and counseling about physical activity/exercise needs.;Develop an individualized exercise prescription for aerobic and resistive training based on initial evaluation findings, risk stratification, comorbidities and participant's personal goals.       Expected Outcomes Short Term: Attend rehab on a regular basis to increase amount of physical activity.;Long Term: Add in home exercise to make exercise part of routine and to increase amount of physical activity.;Long Term: Exercising regularly at least 3-5 days a week.       Increase Strength and Stamina Yes       Intervention Provide advice, education, support and counseling about physical activity/exercise needs.;Develop an individualized exercise prescription for aerobic and resistive training based on initial evaluation findings, risk stratification, comorbidities and participant's personal goals.       Expected Outcomes Short Term: Increase workloads from initial exercise prescription for resistance, speed, and METs.;Short Term: Perform resistance training exercises routinely during rehab and add in resistance training at home;Long Term: Improve cardiorespiratory fitness, muscular endurance and strength as measured by increased METs and functional capacity (6MWT)       Able to understand and use rate of perceived exertion (RPE) scale Yes       Intervention Provide education and explanation on how to use RPE scale       Expected Outcomes Short Term: Able to use RPE daily in rehab to express subjective intensity level;Long Term:  Able to use RPE to guide intensity level when exercising independently       Able to understand and use Dyspnea scale Yes       Intervention Provide education and explanation on how to use Dyspnea scale       Expected Outcomes Short Term: Able to use Dyspnea scale daily in rehab to express subjective sense of shortness of breath during  exertion;Long Term: Able to use Dyspnea scale to guide intensity level when exercising independently       Knowledge and understanding of Target Heart Rate Range (THRR) Yes       Intervention Provide education and explanation of THRR including how the numbers were predicted and where they are located for reference  Expected Outcomes Short Term: Able to state/look up THRR;Long Term: Able to use THRR to govern intensity when exercising independently;Short Term: Able to use daily as guideline for intensity in rehab       Able to check pulse independently Yes       Intervention Provide education and demonstration on how to check pulse in carotid and radial arteries.;Review the importance of being able to check your own pulse for safety during independent exercise       Expected Outcomes Short Term: Able to explain why pulse checking is important during independent exercise;Long Term: Able to check pulse independently and accurately       Understanding of Exercise Prescription Yes       Intervention Provide education, explanation, and written materials on patient's individual exercise prescription       Expected Outcomes Short Term: Able to explain program exercise prescription;Long Term: Able to explain home exercise prescription to exercise independently              Exercise Goals Re-Evaluation :  Exercise Goals Re-Evaluation    Row Name 05/01/20 0755 05/13/20 0805 05/26/20 1804 06/10/20 1632 07/01/20 0809     Exercise Goal Re-Evaluation   Exercise Goals Review Increase Physical Activity;Able to understand and use rate of perceived exertion (RPE) scale;Knowledge and understanding of Target Heart Rate Range (THRR);Understanding of Exercise Prescription;Able to check pulse independently;Increase Strength and Stamina Increase Physical Activity;Able to understand and use rate of perceived exertion (RPE) scale;Knowledge and understanding of Target Heart Rate Range (THRR);Understanding of Exercise  Prescription;Able to check pulse independently;Increase Strength and Stamina;Able to understand and use Dyspnea scale Increase Physical Activity;Able to understand and use rate of perceived exertion (RPE) scale;Knowledge and understanding of Target Heart Rate Range (THRR);Understanding of Exercise Prescription;Able to check pulse independently;Increase Strength and Stamina;Able to understand and use Dyspnea scale -- Increase Physical Activity;Able to understand and use rate of perceived exertion (RPE) scale;Knowledge and understanding of Target Heart Rate Range (THRR);Understanding of Exercise Prescription;Able to check pulse independently;Increase Strength and Stamina;Able to understand and use Dyspnea scale   Comments Reviewed RPE and dyspnea scales, THR and program prescription with pt today.  Pt voiced understanding and was given a copy of goals to take home. Reviewed home exercise with pt today.  Pt plans to walk at home for exercise.  Reviewed THR, pulse, RPE, sign and symptoms, pulse oximetery and when to call 911 or MD.  Also discussed weather considerations and indoor options.  Pt voiced understanding.  Sharon Cervantes is going to get back to her exercise routine. Sharon Cervantes has a bruised meniscus and her Dr recommended her not do the TM for now.  Staff will monitor progress. out since last review Sharon Cervantes is back for the first time since her last review. She has not been exercising at home due to knee problems - starting PT on Friday. MD says she can walk on a flat surface and seated machines that don't irritate her knee (XR hurt her knee). Will monitor to see how she progresses and re-evaluate next goal review.   Expected Outcomes Short: Use RPE daily to regulate intensity. Long: Follow program prescription in THR. Short: Get back to exericse routine.  Long: Continue to improve stamina. Short:  maintain consistent exercise Long: get back to walking -- ST: attend rehab and PT LT: exercise 150 minutes of moderate or 70  minutes of vigorous exercise per week with at least 2 days of weight training.   Row Name 07/08/20 [id number] 07/09/20 [phone]  08/06/20 0827         Exercise Goal Re-Evaluation   Exercise Goals Review Increase Physical Activity;Increase Strength and Stamina Increase Physical Activity;Increase Strength and Stamina;Understanding of Exercise Prescription --     Comments Caden is doing PT on M/W in addition to her LW sessions.  Today she is doing the T5 and walking track. Laketha is doing ewll in rehab. She is doing fine with T5 NuStep and her knee is feeling better. We will continue to monitor her progress. Out with knee since last review.     Expected Outcomes Short: continue PT and LW Long: increase stamina Short: Continue to use PT for knee Long; Continue to improve stamina. --            Discharge Exercise Prescription (Final Exercise Prescription Changes):  Exercise Prescription Changes - 07/09/20 0900      Response to Exercise   Blood Pressure (Admit) 116/70    Blood Pressure (Exercise) 120/66    Blood Pressure (Exit) 110/64    Heart Rate (Admit) 86 bpm    Heart Rate (Exercise) 108 bpm    Heart Rate (Exit) 90 bpm    Oxygen Saturation (Admit) 96 %    Oxygen Saturation (Exercise) 97 %    Oxygen Saturation (Exit) 97 %    Rating of Perceived Exertion (Exercise) 12    Perceived Dyspnea (Exercise) 2    Symptoms knee pain    Duration Continue with 30 min of aerobic exercise without signs/symptoms of physical distress.    Intensity THRR unchanged      Progression   Progression Continue to progress workloads to maintain intensity without signs/symptoms of physical distress.    Average METs 2.66      Resistance Training   Training Prescription Yes    Weight  3lb    Reps 10-15      Interval Training   Interval Training No      Treadmill   MPH 1.9    Grade 1    Minutes 15    METs 2.72      T5 Nustep   Level 5    Minutes 30    METs 2.6      Home Exercise Plan   Plans to continue  exercise at Home (comment)   walking, gym   Frequency Add 2 additional days to program exercise sessions.    Initial Home Exercises Provided 05/13/20           Nutrition:  Target Goals: Understanding of nutrition guidelines, daily intake of sodium 1500mg , cholesterol 200mg , calories 30% from fat and 7% or less from saturated fats, daily to have 5 or more servings of fruits and vegetables.  Education: All About Nutrition: -Group instruction provided by verbal, written material, interactive activities, discussions, models, and posters to present general guidelines for heart healthy nutrition including fat, fiber, MyPlate, the role of sodium in heart healthy nutrition, utilization of the nutrition label, and utilization of this knowledge for meal planning. Follow up email sent as well. Written material given at graduation. Flowsheet Row Pulmonary Rehab from 07/10/2020 in [facility]  Date 07/10/20  Educator [facility]  Instruction Review Code 1- Verbalizes Understanding      Biometrics:  Pre Biometrics - 04/29/20 0909      Pre Biometrics   Height 5' 5.7" (1.669 m)    Weight 251 lb 1.6 oz (113.9 kg)    BMI (Calculated) 40.89    Single Leg Stand 6.47 seconds  Nutrition Therapy Plan and Nutrition Goals:  Nutrition Therapy & Goals - 07/08/20 0849      Nutrition Therapy   Diet heart healthy, low Na    Protein (specify units) 90g    Fiber 25 grams    Whole Grain Foods 3 servings    Saturated Fats 12 max. grams    Fruits and Vegetables 8 servings/day    Sodium 1.5 grams      Personal Nutrition Goals   Nutrition Goal ST; include 2 additional vegetables or fruit in snacks or breakfast, try 3 new vegetables for dinner meals - interested in honey-nut squash LT: include 8 fruits or vegetables every day (or most days), include a variety (all the colors most weeks)    Comments B: Bagel (cream cheese - sometimes strawberry low-fat) or cinnamon and raisin (peanut  butter - no sugar or salt) or scrambled egg (2 eggs) - whole wheat bread dry.  S: pack of peanut butter crackers. L: ham sandwich or pimento cheese sandwich and chips on whole wheat.  S: another pack of peanut butter crackers D: sometimes eat out. At home chicken and green beans (airfrier and frozen vegetables) or another bagel. Doesn't use fat to cook - maybe some oil and garlic, salt and pepper on green beans. No longer in weight watchers. Vegetables: green beans, corn, potatoes, peas, black beans, butter beans. Drink: tea - unsweet. water with crystal light. Coffee x2 - stevia and coffee mate (creamer). Reviewed heart healthy eating and the importance of getting enough vegetables and a variety as well as fat with meals to absorb fat-soluble vitamins.      Intervention Plan   Intervention Prescribe, educate and counsel regarding individualized specific dietary modifications aiming towards targeted core components such as weight, hypertension, lipid management, diabetes, heart failure and other comorbidities.;Nutrition handout(s) given to patient.    Expected Outcomes Short Term Goal: Understand basic principles of dietary content, such as calories, fat, sodium, cholesterol and nutrients.;Short Term Goal: A plan has been developed with personal nutrition goals set during dietitian appointment.;Long Term Goal: Adherence to prescribed nutrition plan.           Nutrition Assessments:  Nutrition Assessments - 04/29/20 0851      MEDFICTS Scores   Pre Score 42          MEDIFICTS Score Key:  ?70 Need to make dietary changes   40-70 Heart Healthy Diet  ? 40 Therapeutic Level Cholesterol Diet   Picture Your Plate Scores:  <78 Unhealthy dietary pattern with much room for improvement.  41-50 Dietary pattern unlikely to meet recommendations for good health and room for improvement.  51-60 More healthful dietary pattern, with some room for improvement.   >60 Healthy dietary pattern, although  there may be some specific behaviors that could be improved.   Nutrition Goals Re-Evaluation:   Nutrition Goals Discharge (Final Nutrition Goals Re-Evaluation):   Psychosocial: Target Goals: Acknowledge presence or absence of significant depression and/or stress, maximize coping skills, provide positive support system. Participant is able to verbalize types and ability to use techniques and skills needed for reducing stress and depression.   Education: Stress, Anxiety, and Depression - Group verbal and visual presentation to define topics covered.  Reviews how body is impacted by stress, anxiety, and depression.  Also discusses healthy ways to reduce stress and to treat/manage anxiety and depression.  Written material given at graduation.   Education: Sleep Hygiene -Provides group verbal and written instruction about how sleep can affect your  health.  Define sleep hygiene, discuss sleep cycles and impact of sleep habits. Review good sleep hygiene tips.    Initial Review & Psychosocial Screening:  Initial Psych Review & Screening - 04/29/20 0758      Initial Review   Current issues with Current Stress Concerns;History of Depression;Current Depression;Current Sleep Concerns    Source of Stress Concerns Chronic Illness;Financial    Comments PHQ is 5, no feeling of depression currently, not sleeping great 4-5 hours a night (going to bed late and up early), finances have gotten better      Sharon Cervantes? Yes   Sharon Cervantes lives near by, [place] friend Sharon Cervantes (moving this weekend)   Concerns Inappropriate over/under dependence on family/friends      Barriers   Psychosocial barriers to participate in program The patient should benefit from training in stress management and relaxation.;Psychosocial barriers identified (see note)      Screening Interventions   Interventions Provide feedback about the scores to participant;To provide support and resources with identified  psychosocial needs;Encouraged to exercise    Expected Outcomes Short Term goal: Utilizing psychosocial counselor, staff and physician to assist with identification of specific Stressors or current issues interfering with healing process. Setting desired goal for each stressor or current issue identified.;Long Term Goal: Stressors or current issues are controlled or eliminated.;Short Term goal: Identification and review with participant of any Quality of Life or Depression concerns found by scoring the questionnaire.;Long Term goal: The participant improves quality of Life and PHQ9 Scores as seen by post scores and/or verbalization of changes           Quality of Life Scores:  Scores of 19 and below usually indicate a poorer quality of life in these areas.  A difference of  2-3 points is a clinically meaningful difference.  A difference of 2-3 points in the total score of the Quality of Life Index has been associated with significant improvement in overall quality of life, self-image, physical symptoms, and general health in studies assessing change in quality of life.  PHQ-9: Recent Review Flowsheet Data    Depression screen [facility] 2/9 05/13/2020 04/29/2020 12/24/2019 12/10/2019 10/29/2019   Decreased Interest 0 0 0 2 0   Down, Depressed, Hopeless 0 0 0 3 0   PHQ - 2 Score 0 0 0 5 0   Altered sleeping 2 3 2 2  0   Tired, decreased energy 1 1 1 2  0   Change in appetite 1 0 0 0 0   Feeling bad or failure about yourself  0 0 0 1 0   Trouble concentrating 1 1 1 1  0   Moving slowly or fidgety/restless 0 0 0 0 0   Suicidal thoughts 0 0 0 0 0   PHQ-9 Score 5 5 4 11  0   Difficult doing work/chores Somewhat difficult Somewhat difficult Not difficult at all Somewhat difficult Not difficult at all     Interpretation of Total Score  Total Score Depression Severity:  1-4 = Minimal depression, 5-9 = Mild depression, 10-14 = Moderate depression, 15-19 = Moderately severe depression, 20-27 = Severe depression    Psychosocial Evaluation and Intervention:  Psychosocial Evaluation - 04/29/20 0800      Psychosocial Evaluation & Interventions   Interventions Stress management education;Encouraged to exercise with the program and follow exercise prescription    Comments Sharon Cervantes is coming back to pulmonary rehab with her asthma that has gotten worse since surgery.  She wants to  focus on getting on her nebulizer and get back to walking longer.  She has a few new friends, but one is moving away this weekend.  She wants to get her weight back down too. Her finances are doing better.  Sleep continues to struggle for her.    Expected Outcomes Short: Attend rehab to improve breathing and endurance  Long: Continue to focus on positive and use mental boost    Continue Psychosocial Services  Follow up required by staff           Psychosocial Re-Evaluation:  Psychosocial Re-Evaluation    Row Name 05/13/20 0757 07/01/20 0814 07/08/20 5493         Psychosocial Re-Evaluation   Current issues with Current Stress Concerns;Current Sleep Concerns History of Depression;Current Psychotropic Meds;Current Depression;Current Sleep Concerns --     Comments Sharon Cervantes is doing well in rehab.  She is sleeping pretty good, but still is staying up late.  She is trying to get better.  Her cat is doing better to with sleep.  She is going to get started on her home exercise again.  Her PHQ is the same, but shifting around some. She is on Effexor to manage depression. She reports no symptoms of depression in last couple of weeks and does not go to talk therapy. She reports that she can't find anyone for talk therapy. Discussed some options to look for therapists including [facility] since she is not having luck finding anyone in person who is taking new patients. She will read, crochet to reduce stress. She reports getting 4-5 hours of sleep each night - sleep maintenance insomia - tried melatonin, but didn't see any difference. Discussed  sleep hygiene. Sharon Cervantes says she has good and bad days as far as depression.  She still is on Effexor.  She has spoken to her Dr about sleep and has a prescirption for trazadone but has only tried it once.  We discussed trying some realxing music if she wakes up to help her get back to sleep.     Expected Outcomes Short: Get back to exercise routine for mood boost  Long: Continue to focus on positive. ST: Find therapist, try sleep hygiene techniques. LT: continue to manage depression, sleep throughout the night. Short: try music to help with sleep long: develop better sleep patterns     Interventions Encouraged to attend Pulmonary Rehabilitation for the exercise Encouraged to attend Pulmonary Rehabilitation for the exercise --     Continue Psychosocial Services  Follow up required by staff Follow up required by staff --     Comments -- She reports no current stress concerns, she is still working to manage her depression and sleep. --            Psychosocial Discharge (Final Psychosocial Re-Evaluation):  Psychosocial Re-Evaluation - 07/08/20 0822      Psychosocial Re-Evaluation   Comments Sharon Cervantes says she has good and bad days as far as depression.  She still is on Effexor.  She has spoken to her Dr about sleep and has a prescirption for trazadone but has only tried it once.  We discussed trying some realxing music if she wakes up to help her get back to sleep.    Expected Outcomes Short: try music to help with sleep long: develop better sleep patterns           Education: Education Goals: Education classes will be provided on a weekly basis, covering required topics. Participant will state understanding/return demonstration of topics  presented.  Learning Barriers/Preferences:  Learning Barriers/Preferences - 04/29/20 0803      Learning Barriers/Preferences   Learning Barriers None    Learning Preferences None           General Pulmonary Education Topics:  Infection Prevention: -  Provides verbal and written material to individual with discussion of infection control including proper hand washing and proper equipment cleaning during exercise session. Flowsheet Row Pulmonary Rehab from 07/10/2020 in [facility] Cardiac and Pulmonary Rehab  Date 04/29/20  Educator [facility]  Instruction Review Code 1- Verbalizes Understanding      Falls Prevention: - Provides verbal and written material to individual with discussion of falls prevention and safety. Flowsheet Row Pulmonary Rehab from 07/10/2020 in [facility] Cardiac and Pulmonary Rehab  Date 04/29/20  Educator [facility]  Instruction Review Code 1- Verbalizes Understanding      Chronic Lung Disease Review: - Group verbal instruction with posters, models, PowerPoint presentations and videos,  to review new updates, new respiratory medications, new advancements in procedures and treatments. Providing information on websites and "800" numbers for continued self-education. Includes information about supplement oxygen, available portable oxygen systems, continuous and intermittent flow rates, oxygen safety, concentrators, and Medicare reimbursement for oxygen. Explanation of Pulmonary Drugs, including class, frequency, complications, importance of spacers, rinsing mouth after steroid MDI's, and proper cleaning methods for nebulizers. Review of basic lung anatomy and physiology related to function, structure, and complications of lung disease. Review of risk factors. Discussion about methods for diagnosing sleep apnea and types of masks and machines for OSA. Includes a review of the use of types of environmental controls: home humidity, furnaces, filters, dust mite/pet prevention, HEPA vacuums. Discussion about weather changes, air quality and the benefits of nasal washing. Instruction on Warning signs, infection symptoms, calling MD promptly, preventive modes, and value of vaccinations. Review of effective airway clearance, coughing and/or vibration techniques.  Emphasizing that all should Create an Action Plan. Written material given at graduation. Flowsheet Row Pulmonary Rehab from 07/10/2020 in [facility] Cardiac and Pulmonary Rehab  Date 04/29/20  Instruction Review Code 3- Needs Reinforcement  [need identified]      AED/CPR: - Group verbal and written instruction with the use of models to demonstrate the basic use of the AED with the basic ABC's of resuscitation.    Anatomy and Cardiac Procedures: - Group verbal and visual presentation and models provide information about basic cardiac anatomy and function. Reviews the testing methods done to diagnose heart disease and the outcomes of the test results. Describes the treatment choices: Medical Management, Angioplasty, or Coronary Bypass Surgery for treating various heart conditions including Myocardial Infarction, Angina, Valve Disease, and Cardiac Arrhythmias.  Written material given at graduation.   Medication Safety: - Group verbal and visual instruction to review commonly prescribed medications for heart and lung disease. Reviews the medication, class of the drug, and side effects. Includes the steps to properly store meds and maintain the prescription regimen.  Written material given at graduation.   Other: -Provides group and verbal instruction on various topics (see comments)   Knowledge Questionnaire Score:  Knowledge Questionnaire Score - 04/29/20 0851      Knowledge Questionnaire Score   Pre Score 16/18 Education Focus: O2 safety            Core Components/Risk Factors/Patient Goals at Admission:  Personal Goals and Risk Factors at Admission - 04/29/20 0803      Core Components/Risk Factors/Patient Goals on Admission    Weight Management Yes;Obesity;Weight Loss  Intervention Weight Management: Develop a combined nutrition and exercise program designed to reach desired caloric intake, while maintaining appropriate intake of nutrient and fiber, sodium and fats, and appropriate  energy expenditure required for the weight goal.;Weight Management/Obesity: Establish reasonable short term and long term weight goals.;Weight Management: Provide education and appropriate resources to help participant work on and attain dietary goals.;Obesity: Provide education and appropriate resources to help participant work on and attain dietary goals.    Admit Weight 251 lb 1.6 oz (113.9 kg)    Goal Weight: Short Term 245 lb (111.1 kg)    Goal Weight: Long Term 240 lb (108.9 kg)    Expected Outcomes Short Term: Continue to assess and modify interventions until short term weight is achieved;Long Term: Adherence to nutrition and physical activity/exercise program aimed toward attainment of established weight goal;Weight Loss: Understanding of general recommendations for a balanced deficit meal plan, which promotes 1-2 lb weight loss per week and includes a negative energy balance of [phone] kcal/d;Understanding recommendations for meals to include 15-35% energy as protein, 25-35% energy from fat, 35-60% energy from carbohydrates, less than $RemoveB'200mg'FYFFTPlW$  of dietary cholesterol, 20-35 gm of total fiber daily;Understanding of distribution of calorie intake throughout the day with the consumption of 4-5 meals/snacks    Improve shortness of breath with ADL's Yes    Intervention Provide education, individualized exercise plan and daily activity instruction to help decrease symptoms of SOB with activities of daily living.    Expected Outcomes Short Term: Improve cardiorespiratory fitness to achieve a reduction of symptoms when performing ADLs;Long Term: Be able to perform more ADLs without symptoms or delay the onset of symptoms    Hypertension Yes    Intervention Provide education on lifestyle modifcations including regular physical activity/exercise, weight management, moderate sodium restriction and increased consumption of fresh fruit, vegetables, and low fat dairy, alcohol moderation, and smoking  cessation.;Monitor prescription use compliance.    Expected Outcomes Long Term: Maintenance of blood pressure at goal levels.;Short Term: Continued assessment and intervention until BP is < 140/18mm HG in hypertensive participants. < 130/73mm HG in hypertensive participants with diabetes, heart failure or chronic Cervantes disease.    Lipids Yes    Intervention Provide education and support for participant on nutrition & aerobic/resistive exercise along with prescribed medications to achieve LDL '70mg'$ , HDL >$Remo'40mg'trDmT$ .    Expected Outcomes Short Term: Participant states understanding of desired cholesterol values and is compliant with medications prescribed. Participant is following exercise prescription and nutrition guidelines.;Long Term: Cholesterol controlled with medications as prescribed, with individualized exercise RX and with personalized nutrition plan. Value goals: LDL < $Rem'70mg'EFWI$ , HDL > 40 mg.           Education:Diabetes - Individual verbal and written instruction to review signs/symptoms of diabetes, desired ranges of glucose level fasting, after meals and with exercise. Acknowledge that pre and post exercise glucose checks will be done for 3 sessions at entry of program.   Know Your Numbers and Heart Failure: - Group verbal and visual instruction to discuss disease risk factors for cardiac and pulmonary disease and treatment options.  Reviews associated critical values for Overweight/Obesity, Hypertension, Cholesterol, and Diabetes.  Discusses basics of heart failure: signs/symptoms and treatments.  Introduces Heart Failure Zone chart for action plan for heart failure.  Written material given at graduation.   Core Components/Risk Factors/Patient Goals Review:   Goals and Risk Factor Review    Row Name 05/13/20 0800 07/01/20 0826 07/08/20 0809         Core  Components/Risk Factors/Patient Goals Review   Personal Goals Review Weight Management/Obesity;Improve shortness of breath with  ADL's;Hypertension;Lipids Weight Management/Obesity;Improve shortness of breath with ADL's;Hypertension Weight Management/Obesity;Improve shortness of breath with ADL's;Hypertension     Review Sharon Cervantes is off to a good start in rehab.  Her weight was up today as she was eating over the weekend.  She is determined to get back to exercise to help curb her eating.  Blood pressures have been doing good in class but she is not checking at home since they have been doing better.  Her breathing is still a bit of a stuggle recently. She is trying to get her neublizer im She reports her weight has been stable - has not met with RD regarding nutrition yet. She reports SOB is good unless she has a coughing spell - hasn't had to use her inhaler in a week or two. She only takes nebulizer in morning because it makes her shake. She reports her BP has been good at rehab, she doesn't have a BP cuff , but will buy one. SOB has been ok lately except when she has a coughing spell.  She is doing PT for knee and was advised not to do TM for now, but can walk on track.  She is taking meds as directed.  She is going to order a BP cuff ffrom [facility] today.     Expected Outcomes Short: Get back to nebulizer  Long: Work on weight loss. ST: Get BP cuff, continue to take nebulizer. LT: meet with RD Short: order BP cuff, meet with RD today Long: monitor BP at home, follow RD recommendations            Core Components/Risk Factors/Patient Goals at Discharge (Final Review):   Goals and Risk Factor Review - 07/08/20 0809      Core Components/Risk Factors/Patient Goals Review   Personal Goals Review Weight Management/Obesity;Improve shortness of breath with ADL's;Hypertension    Review SOB has been ok lately except when she has a coughing spell.  She is doing PT for knee and was advised not to do TM for now, but can walk on track.  She is taking meds as directed.  She is going to order a BP cuff ffrom [facility] today.    Expected Outcomes  Short: order BP cuff, meet with RD today Long: monitor BP at home, follow RD recommendations           ITP Comments:  ITP Comments    Row Name 04/29/20 0805 05/01/20 0755 05/06/20 0717 05/07/20 1548 05/22/20 0746   ITP Comments Completed 6MWT and gym orientation. Initial ITP created and sent for review to Dr. Emily Filbert, Medical Director. Documentaiton for diagnosis can be found in Care Everywhere encounter 04/10/2020. First full day of exercise!  Patient was oriented to gym and equipment including functions, settings, policies, and procedures.  Patient's individual exercise prescription and treatment plan were reviewed.  All starting workloads were established based on the results of the 6 minute walk test done at initial orientation visit.  The plan for exercise progression was also introduced and progression will be customized based on patient's performance and goals pt fell and hurt knee at home yesterday 30 day review completed. ITP sent to Dr. Emily Filbert, Medical Director of Cardiac and Pulmonary Rehab. Continue with ITP unless changes are made by physician. [place] saw the doctor yesterday and they started her on prednisone for her knee.  She is also supposed to stay off the treadmill  for 3 weeks as well.   [place] Name 06/04/20 0548 06/10/20 1631 06/17/20 0719 06/25/20 1453 07/02/20 0640   ITP Comments 30 Day review completed. Medical Director ITP review done, changes made as directed, and signed approval by Medical Director. [place] has called out the past couple of weeks with knee pain.  She is supposed to see her doctor this week to see what is going on.  She will be out of town on Thursday. [place] called and said she will not be here for the next two weeks.  She also left message that her physician has started her with PT for her knee. Pt has not attended since last review. 30 Day review completed. Medical Director ITP review done, changes made as directed, and signed approval by Medical Director.    [place] Name 07/22/20 1304 07/22/20 1531 07/30/20 0940 08/06/20 0827 08/27/20 0555   ITP Comments Pt has not attended since last review. Cathye will have surgery for a hernia tomorrow.  She will let staff know when she is cleared to return to exercise. 30 Day review completed. Medical Director ITP review done, changes made as directed, and signed approval by Medical Director. Called to check on patient.  She saw surgeon yesterday and still having fluid built up around knee.  They want her to stay off her knee for about 4 weeks. They may have to drain it if it doesn't improve.  We will cancel appointments through the end of the year and she hopes to be able to return in January. 30 Day review completed. Medical Director ITP review done, changes made as directed, and signed approval by Medical Director.  Remains out with knee problems          Comments:

## 2020-09-02 ENCOUNTER — Encounter: Payer: Medicare Other | Attending: Pulmonary Disease | Admitting: *Deleted

## 2020-09-02 ENCOUNTER — Other Ambulatory Visit: Payer: Self-pay

## 2020-09-02 DIAGNOSIS — I272 Pulmonary hypertension, unspecified: Secondary | ICD-10-CM | POA: Insufficient documentation

## 2020-09-02 DIAGNOSIS — J4550 Severe persistent asthma, uncomplicated: Secondary | ICD-10-CM

## 2020-09-02 NOTE — Progress Notes (Signed)
Incomplete Session Note  Patient Details  Name: Sharon Cervantes MRN: [id number] Date of Birth: 1951-08-01 Referring Provider:   Flowsheet Row Pulmonary Rehab from 04/29/2020 in [facility] Cardiac [facility] Rehab  Referring Provider Georga Hacking MD      Fleet Contras did not complete her rehab session.  Shaida arrived to day and was talking about her  recent surgery and going to surgeon to have the site drained.  She was sent home without exercise until we have clearance from the surgeon. She will talk to her surgeon when she goes in for the site draining procedure.

## 2020-09-04 ENCOUNTER — Other Ambulatory Visit: Payer: Self-pay

## 2020-09-04 DIAGNOSIS — J4550 Severe persistent asthma, uncomplicated: Secondary | ICD-10-CM | POA: Diagnosis present

## 2020-09-04 DIAGNOSIS — I272 Pulmonary hypertension, unspecified: Secondary | ICD-10-CM

## 2020-09-04 NOTE — Progress Notes (Signed)
Daily Session Note  Patient Details  Name: Sharon Cervantes MRN: [id number] Date of Birth: Oct 19, 1950 Referring Provider:   Flowsheet Row Pulmonary Rehab from 04/29/2020 in [facility] Cardiac and Pulmonary Rehab  Referring Provider Claudette Stapler MD      Encounter Date: 09/04/2020  Check In:  Session Check In - 09/04/20 0746      Check-In   Supervising physician immediately available to respond to emergencies See telemetry face sheet for immediately available ER MD    Location [facility]-Cardiac & Pulmonary Rehab    Staff Present Birdie Sons, MPA, RN;Melissa Caiola RDN, LDN;Jessica Luan Pulling, MA, RCEP, CCRP, CCET    Virtual Visit No    Medication changes reported     No    Fall or balance concerns reported    No    Tobacco Cessation No Change    Current number of cigarettes/nicotine per day     0    Warm-up and Cool-down Performed on first and last piece of equipment    Resistance Training Performed Yes    VAD Patient? No    PAD/SET Patient? No      Pain Assessment   Currently in Pain? No/denies              Social History   Tobacco Use  Smoking Status Never Smoker  Smokeless Tobacco Never Used    Goals Met:  Independence with exercise equipment Exercise tolerated well No report of cardiac concerns or symptoms Strength training completed today  Goals Unmet:  Not Applicable  Comments: Pt able to follow exercise prescription today without complaint.  Will continue to monitor for progression.    Dr. Emily Filbert is Medical Director for [place] and [name] Pulmonary [facility].

## 2020-09-09 ENCOUNTER — Encounter: Payer: Medicare Other | Admitting: *Deleted

## 2020-09-09 ENCOUNTER — Other Ambulatory Visit: Payer: Self-pay

## 2020-09-09 DIAGNOSIS — J4550 Severe persistent asthma, uncomplicated: Secondary | ICD-10-CM | POA: Diagnosis not present

## 2020-09-09 NOTE — Progress Notes (Signed)
Daily Session Note  Patient Details  Name: Rosita Guzzetta MRN: [id number] Date of Birth: [birth year] Referring Provider:   Flowsheet Row Pulmonary Rehab from 04/29/2020 in [facility] Cardiac and Pulmonary Rehab  Referring Provider Claudette Stapler MD      Encounter Date: 09/09/2020  Check In:      Social History   Tobacco Use  Smoking Status Never Smoker  Smokeless Tobacco Never Used    Goals Met:  Proper associated with RPD/PD & O2 Sat Independence with exercise equipment Exercise tolerated well No report of cardiac concerns or symptoms  Goals Unmet:  Not Applicable  Comments: Pt able to follow exercise prescription today without complaint.  Will continue to monitor for progression.    Dr. Emily Filbert is Medical Director for [place] and [name].

## 2020-09-18 ENCOUNTER — Encounter: Payer: Self-pay | Admitting: *Deleted

## 2020-09-18 DIAGNOSIS — J4550 Severe persistent asthma, uncomplicated: Secondary | ICD-10-CM

## 2020-09-22 ENCOUNTER — Other Ambulatory Visit: Payer: Medicare Other

## 2020-09-24 ENCOUNTER — Encounter: Payer: Self-pay | Admitting: *Deleted

## 2020-09-24 DIAGNOSIS — J4550 Severe persistent asthma, uncomplicated: Secondary | ICD-10-CM

## 2020-09-24 NOTE — Progress Notes (Signed)
Pulmonary Individual Treatment Plan  Patient Details  Name: Sharon Cervantes MRN: [id number] Date of Birth: 05/27/1951 Referring Provider:   Flowsheet Row Pulmonary Rehab from 04/29/2020 in [facility]  Referring Provider Aleskerov, Fred MD      Initial Encounter Date:  Flowsheet Row Pulmonary Rehab from 04/29/2020 in [facility]  Date 04/29/20      Visit Diagnosis: Severe persistent asthma, unspecified whether complicated  Patient's Home Medications on Admission:  Current Outpatient Medications:  .  albuterol (PROVENTIL HFA;VENTOLIN HFA) 108 (90 Base) MCG/ACT inhaler, Inhale 2 puffs into the lungs every 6 (six) hours as needed for wheezing or shortness of breath. , Disp: , Rfl:  .  albuterol (PROVENTIL) (2.5 MG/3ML) 0.083% nebulizer solution, Take 2.5 mg by nebulization 2 (two) times daily as needed for wheezing or shortness of breath., Disp: , Rfl:  .  budesonide (PULMICORT) 0.5 MG/2ML nebulizer solution, Take 0.5 mg by nebulization 2 (two) times daily as needed (shortness of breath or wheezing). , Disp: , Rfl:  .  cyanocobalamin (,VITAMIN B-12,) 1000 MCG/ML injection, Inject 1,000 mcg into the muscle every 30 (thirty) days., Disp: , Rfl:  .  diclofenac (VOLTAREN) 50 MG EC tablet, Take 50 mg by mouth 2 (two) times daily., Disp: , Rfl:  .  diclofenac Sodium (VOLTAREN) 1 % GEL, Apply 1 application topically 4 (four) times daily as needed (pain)., Disp: , Rfl:  .  esomeprazole (NEXIUM) 40 MG capsule, Take 40 mg by mouth daily. , Disp: , Rfl:  .  FLUoxetine (PROZAC) 10 MG tablet, Take 10 mg by mouth daily., Disp: , Rfl:  .  furosemide (LASIX) 20 MG tablet, Take 20 mg by mouth daily as needed for edema., Disp: , Rfl:  .  gabapentin (NEURONTIN) 300 MG capsule, Take 300 mg by mouth 3 (three) times daily. , Disp: , Rfl:  .  hydrochlorothiazide (HYDRODIURIL) 12.5 MG tablet, Take 12.5 mg by mouth daily. , Disp: , Rfl:  .  HYDROcodone-acetaminophen (NORCO)  5-325 MG tablet, Take 1 tablet by mouth every 6 (six) hours as needed for up to 6 doses for moderate pain., Disp: 6 tablet, Rfl: 0 .  HYDROcodone-homatropine (HYCODAN) 5-1.5 MG/5ML syrup, Take 5 mLs by mouth every 6 (six) hours as needed for cough., Disp: , Rfl:  .  ibuprofen (ADVIL) 200 MG tablet, Take 800 mg by mouth every 6 (six) hours as needed for headache or moderate pain., Disp: , Rfl:  .  montelukast (SINGULAIR) 10 MG tablet, Take 1 tablet (10 mg total) by mouth at bedtime. appt further refills (Patient taking differently: Take 10 mg by mouth at bedtime. ), Disp: 90 tablet, Rfl: 1 .  oxymetazoline (AFRIN) 0.05 % nasal spray, Place 1 spray into both nostrils daily as needed (nose bleeds)., Disp: , Rfl:  .  pramipexole (MIRAPEX) 1.5 MG tablet, Take 1.5 mg by mouth at bedtime. , Disp: , Rfl:  .  traMADol (ULTRAM) 50 MG tablet, Take 50 mg by mouth every 8 (eight) hours as needed for moderate pain., Disp: , Rfl:  .  traZODone (DESYREL) 100 MG tablet, Take 100 mg by mouth at bedtime as needed for sleep., Disp: , Rfl:  .  venlafaxine XR (EFFEXOR-XR) 75 MG 24 hr capsule, Take 150 mg by mouth daily with breakfast. , Disp: , Rfl:  No current facility-administered medications for this visit.  Facility-Administered Medications Ordered in Other Visits:  .  [COMPLETED] sodium chloride 0.9 % nebulizer solution 3 mL, 3 mL,   Nebulization, Once, 3 mL at 10/03/18 1130 **FOLLOWED BY** [COMPLETED] methacholine (PROVOCHOLINE) inhaler solution 0.125 mg, 2 mL, Inhalation, Once, 0.125 mg at 10/03/18 1139 **FOLLOWED BY** methacholine (PROVOCHOLINE) inhaler solution 0.5 mg, 2 mL, Inhalation, Once **FOLLOWED BY** methacholine (PROVOCHOLINE) inhaler solution 2 mg, 2 mL, Inhalation, Once **FOLLOWED BY** methacholine (PROVOCHOLINE) inhaler solution 8 mg, 2 mL, Inhalation, Once **FOLLOWED BY** methacholine (PROVOCHOLINE) inhaler solution 32 mg, 2 mL, Inhalation, Once **FOLLOWED BY** [COMPLETED] albuterol (PROVENTIL) (2.5 MG/3ML)  0.083% nebulizer solution 2.5 mg, 2.5 mg, Nebulization, Once, Ottie Glazier, MD, 2.5 mg at 10/03/18 1146  Past Medical History: Past Medical History:  Diagnosis Date  . Arthritis   . Asthma   . Chronic cough   . Colon polyps   . Depression   . GERD (gastroesophageal reflux disease)   . Hiatal hernia    LARGE  . History of chicken pox   . Hypertension   . Macular degeneration   . Multinodular goiter    FNA in past neg h/o thyroid cysts  . Pernicious anemia    PERNICIOUS   . RLS (restless legs syndrome)   . Sleep apnea    NO CPAP    Tobacco Use: Social History   Tobacco Use  Smoking Status Never Smoker  Smokeless Tobacco Never Used    Labs: Recent Review Scientist, physiological    Labs for ITP Cardiac and Pulmonary Rehab Latest Ref Rng & Units 10/12/2018   Hemoglobin A1c 4.6 - 6.5 % 5.6       Pulmonary Assessment Scores:  Pulmonary Assessment Scores    Row Name 04/29/20 0834         ADL UCSD   ADL Phase Entry     SOB Score total 29     Rest 0     Walk 0     Stairs 5     Bath 0     Dress 0     Shop 1           CAT Score   CAT Score 26           mMRC Score   mMRC Score 1            UCSD: Self-administered rating of dyspnea associated with activities of daily living (ADLs) 6-point scale (0 = "not at all" to 5 = "maximal or unable to do because of breathlessness")  Scoring Scores range from 0 to 120.  Minimally important difference is 5 units  CAT: CAT can identify the health impairment of COPD patients and is better correlated with disease progression.  CAT has a scoring range of zero to 40. The CAT score is classified into four groups of low (less than 10), medium (10 - 20), high (21-30) and very high (31-40) based on the impact level of disease on health status. A CAT score over 10 suggests significant symptoms.  A worsening CAT score could be explained by an exacerbation, poor medication adherence, poor inhaler technique, or progression of COPD or  comorbid conditions.  CAT MCID is 2 points  mMRC: mMRC (Modified Medical Research Council) Dyspnea Scale is used to assess the degree of baseline functional disability in patients of respiratory disease due to dyspnea. No minimal important difference is established. A decrease in score of 1 point or greater is considered a positive change.   Pulmonary Function Assessment:  Pulmonary Function Assessment - 04/29/20 0756      Breath   Bilateral Breath Sounds Clear    Shortness of Breath  No;Limiting activity           Exercise Target Goals: Exercise Program Goal: Individual exercise prescription set using results from initial 6 min walk test and THRR while considering  patient's activity barriers and safety.   Exercise Prescription Goal: Initial exercise prescription builds to 30-45 minutes a day of aerobic activity, 2-3 days per week.  Home exercise guidelines will be given to patient during program as part of exercise prescription that the participant will acknowledge.  Education: Aerobic Exercise: - Group verbal and visual presentation on the components of exercise prescription. Introduces F.I.T.T principle from ACSM for exercise prescriptions.  Reviews F.I.T.T. principles of aerobic exercise including progression. Written material given at graduation.   Education: Resistance Exercise: - Group verbal and visual presentation on the components of exercise prescription. Introduces F.I.T.T principle from ACSM for exercise prescriptions  Reviews F.I.T.T. principles of resistance exercise including progression. Written material given at graduation.    Education: Exercise & Equipment Safety: - Individual verbal instruction and demonstration of equipment use and safety with use of the equipment. Flowsheet Row Pulmonary Rehab from 09/04/2020 in [facility] Pul[facility]  Date 04/29/20  Educator [facility]  Instruction Review Code 1- Verbalizes Understanding      Education: Exercise  Physiology & General Exercise Guidelines: - Group verbal and written instruction with models to review the exercise physiology of the cardiovascular system and associated critical values. Provides general exercise guidelines with specific guidelines to those with heart or lung disease.    Education: Flexibility, Balance, Mind/Body Relaxation: - Group verbal and visual presentation with interactive activity on the components of exercise prescription. Introduces F.I.T.T principle from ACSM for exercise prescriptions. Reviews F.I.T.T. principles of flexibility and balance exercise training including progression. Also discusses the mind body connection.  Reviews various relaxation techniques to help reduce and manage stress (i.e. Deep breathing, progressive muscle relaxation, and visualization). Balance handout provided to take home. Written material given at graduation. Flowsheet Row Pulmonary Rehab from 09/04/2020 in [facility] Cardiac and Pulmonary Rehab  Date 09/04/20  Educator AS  Instruction Review Code 1- Verbalizes Understanding      Activity Barriers & Risk Stratification:  Activity Barriers & Cardiac Risk Stratification - 04/29/20 0757      Activity Barriers & Cardiac Risk Stratification   Activity Barriers Deconditioning;Joint Problems;Other (comment);Muscular Weakness;Arthritis;Shortness of Breath;Right Knee Replacement;Balance Concerns    Comments TKR completed July 2021           6 Minute Walk:  6 Minute Walk    Row Name 04/29/20 0903         6 Minute Walk   Phase Initial     Distance 1320 feet     Walk Time 6 minutes     # of Rest Breaks 0     MPH 2.5     METS 2.34     RPE 13     Perceived Dyspnea  2     VO2 Peak 8.2     Symptoms Yes (comment)     Comments SOB     Resting HR 76 bpm     Resting BP 134/74     Resting Oxygen Saturation  95 %     Exercise Oxygen Saturation  during 6 min walk 89 %     Max Ex. HR 108 bpm     Max Ex. BP 144/66     2 Minute Post BP 136/70            Interval HR  1 Minute HR 100     2 Minute HR 98     3 Minute HR 100     4 Minute HR 106     5 Minute HR 101     6 Minute HR 108     2 Minute Post HR 86     Interval Heart Rate? Yes           Interval Oxygen   Interval Oxygen? Yes     Baseline Oxygen Saturation % 95 %     1 Minute Oxygen Saturation % 91 %     1 Minute Liters of Oxygen 0 L  Room Air     2 Minute Oxygen Saturation % 89 %     2 Minute Liters of Oxygen 0 L     3 Minute Oxygen Saturation % 92 %     3 Minute Liters of Oxygen 0 L     4 Minute Oxygen Saturation % 93 %     4 Minute Liters of Oxygen 0 L     5 Minute Oxygen Saturation % 94 %     5 Minute Liters of Oxygen 0 L     6 Minute Oxygen Saturation % 94 %     6 Minute Liters of Oxygen 0 L     2 Minute Post Oxygen Saturation % 96 %     2 Minute Post Liters of Oxygen 0 L           Oxygen Initial Assessment:  Oxygen Initial Assessment - 04/29/20 0756      Home Oxygen   Home Oxygen Device None    Sleep Oxygen Prescription None    Home Exercise Oxygen Prescription None    Home Resting Oxygen Prescription None      Initial 6 min Walk   Oxygen Used None      Program Oxygen Prescription   Program Oxygen Prescription None      Intervention   Short Term Goals To learn and understand importance of monitoring SPO2 with pulse oximeter and demonstrate accurate use of the pulse oximeter.;To learn and understand importance of maintaining oxygen saturations>88%;To learn and demonstrate proper pursed lip breathing techniques or other breathing techniques.;To learn and demonstrate proper use of respiratory medications    Long  Term Goals Verbalizes importance of monitoring SPO2 with pulse oximeter and return demonstration;Maintenance of O2 saturations>88%;Exhibits proper breathing techniques, such as pursed lip breathing or other method taught during program session;Compliance with respiratory medication;Demonstrates proper use of MDI's           Oxygen  Re-Evaluation:  Oxygen Re-Evaluation    Row Name 05/13/20 0802 07/01/20 0813           Program Oxygen Prescription   Program Oxygen Prescription None None             Home Oxygen   Home Oxygen Device None None      Sleep Oxygen Prescription None CPAP  She has not recieved it yet      Home Exercise Oxygen Prescription None None      Home Resting Oxygen Prescription None None             Goals/Expected Outcomes   Short Term Goals To learn and understand importance of monitoring SPO2 with pulse oximeter and demonstrate accurate use of the pulse oximeter.;To learn and understand importance of maintaining oxygen saturations>88%;To learn and demonstrate proper pursed lip breathing techniques or other breathing techniques.;To learn and demonstrate proper use  of respiratory medications To learn and understand importance of monitoring SPO2 with pulse oximeter and demonstrate accurate use of the pulse oximeter.;To learn and understand importance of maintaining oxygen saturations>88%;To learn and demonstrate proper pursed lip breathing techniques or other breathing techniques.;To learn and demonstrate proper use of respiratory medications      Long  Term Goals Verbalizes importance of monitoring SPO2 with pulse oximeter and return demonstration;Maintenance of O2 saturations>88%;Exhibits proper breathing techniques, such as pursed lip breathing or other method taught during program session;Compliance with respiratory medication;Demonstrates proper use of MDI's Verbalizes importance of monitoring SPO2 with pulse oximeter and return demonstration;Maintenance of O2 saturations>88%;Exhibits proper breathing techniques, such as pursed lip breathing or other method taught during program session;Compliance with respiratory medication;Demonstrates proper use of MDI's      Comments Caily is struggling with her breathing.  She is supposed to use her nebulizer twice a day but only getting once a day.  She is trying  to use her pursed lip breathing some.  She coughing more and needs her inhaler more often.  She is going to try to use her nebulizer the twice a day. Her sats have been good. Embrie reports trying to remember to due pursed lip breathing, but it doesn't feel natural still. She takes her nebulizer in the morning, but not at night due to shaking.      Goals/Expected Outcomes Short: Nebulizer twice a day Long: Conitnue to work on improving breathing. ST: continue to practice PLB LT: use PLB during activity.             Oxygen Discharge (Final Oxygen Re-Evaluation):  Oxygen Re-Evaluation - 07/01/20 0813      Program Oxygen Prescription   Program Oxygen Prescription None      Home Oxygen   Home Oxygen Device None    Sleep Oxygen Prescription CPAP   She has not recieved it yet   Home Exercise Oxygen Prescription None    Home Resting Oxygen Prescription None      Goals/Expected Outcomes   Short Term Goals To learn and understand importance of monitoring SPO2 with pulse oximeter and demonstrate accurate use of the pulse oximeter.;To learn and understand importance of maintaining oxygen saturations>88%;To learn and demonstrate proper pursed lip breathing techniques or other breathing techniques.;To learn and demonstrate proper use of respiratory medications    Long  Term Goals Verbalizes importance of monitoring SPO2 with pulse oximeter and return demonstration;Maintenance of O2 saturations>88%;Exhibits proper breathing techniques, such as pursed lip breathing or other method taught during program session;Compliance with respiratory medication;Demonstrates proper use of MDI's    Comments Antavia reports trying to remember to due pursed lip breathing, but it doesn't feel natural still. She takes her nebulizer in the morning, but not at night due to shaking.    Goals/Expected Outcomes ST: continue to practice PLB LT: use PLB during activity.           Initial Exercise Prescription:  Initial  Exercise Prescription - 04/29/20 0900      Date of Initial Exercise RX and Referring Provider   Date 04/29/20    Referring Provider Claudette Stapler MD      Treadmill   MPH 2    Grade 0.5    Minutes 15    METs 2.67      REL-XR   Level 1    Speed 50    Minutes 15    METs 2.6      T5 Nustep   Level 2  SPM 80    Minutes 15    METs 2.6      Prescription Details   Frequency (times per week) 2    Duration Progress to 30 minutes of continuous aerobic without signs/symptoms of physical distress      Intensity   THRR 40-80% of Max Heartrate 106-135    Ratings of Perceived Exertion 11-13    Perceived Dyspnea 0-4      Progression   Progression Continue to progress workloads to maintain intensity without signs/symptoms of physical distress.      Resistance Training   Training Prescription Yes    Weight 3 lb    Reps 10-15           Perform Capillary Blood Glucose checks as needed.  Exercise Prescription Changes:  Exercise Prescription Changes    Row Name 04/29/20 0900 05/13/20 1500 05/26/20 1800 07/09/20 0900 09/17/20 1300     Response to Exercise   Blood Pressure (Admit) 134/74 120/72 122/66 116/70 112/76   Blood Pressure (Exercise) 144/66 136/74 132/78 120/66 126/64   Blood Pressure (Exit) 128/60 124/70 122/74 110/64 114/68   Heart Rate (Admit) 76 bpm 74 bpm 88 bpm 86 bpm 81 bpm   Heart Rate (Exercise) 108 bpm 89 bpm 97 bpm 108 bpm 98 bpm   Heart Rate (Exit) 86 bpm 85 bpm 90 bpm 90 bpm 87 bpm   Oxygen Saturation (Admit) 95 % 97 % 94 % 96 % 98 %   Oxygen Saturation (Exercise) 89 % 94 % 96 % 97 % 97 %   Oxygen Saturation (Exit) 94 % 95 % 95 % 97 % 99 %   Rating of Perceived Exertion (Exercise) _0 Perceived Dyspnea (Exercise) _1 Symptoms SOB SOB - knee pain -   Comments walk test results - - - -   Duration - Continue with 30 min of aerobic exercise without signs/symptoms of physical distress. Continue with 30 min of aerobic exercise without  signs/symptoms of physical distress. Continue with 30 min of aerobic exercise without signs/symptoms of physical distress. Continue with 30 min of aerobic exercise without signs/symptoms of physical distress.   Intensity - THRR unchanged THRR unchanged THRR unchanged THRR unchanged     Progression   Progression - Continue to progress workloads to maintain intensity without signs/symptoms of physical distress. Continue to progress workloads to maintain intensity without signs/symptoms of physical distress. Continue to progress workloads to maintain intensity without signs/symptoms of physical distress. Continue to progress workloads to maintain intensity without signs/symptoms of physical distress.   Average METs - 2.38 2.3 2.66 2.55     Resistance Training   Training Prescription - Yes Yes Yes Yes   Weight - 3 lb  3lb  3lb 4 lb   Reps - 10-15 10-15 10-15 10-15     Interval Training   Interval Training - No No No No     Treadmill   MPH - 2.2 - 1.9 -   Grade - 0.5 - 1 -   Minutes - 15 - 15 -   METs - 2.84 - 2.72 -     REL-XR   Level - 1 3 - -   Speed - - 50 - -   Minutes - 15 15 - -   METs - 2 - - -     T5 Nustep   Level - _2 SPM - - 80 - -  Minutes - _0 -   METs - 2.3 2.3 2.6 3.1     Biostep-RELP   Level - - - - 4   SPM - - - - 50   Minutes - - - - 15   METs - - - - 2     Home Exercise Plan   Plans to continue exercise at - Home (comment)  walking, gym - Home (comment)  walking, gym Home (comment)  walking, gym   Frequency - Add 2 additional days to program exercise sessions. - Add 2 additional days to program exercise sessions. Add 2 additional days to program exercise sessions.   Initial Home Exercises Provided - 05/13/20 - 05/13/20 05/13/20          Exercise Comments:  Exercise Comments    Row Name 05/22/20 0747 06/17/20 0719         Exercise Comments Marcelline Mates saw the doctor yesterday and they started her on prednisone for her knee.  She is also  supposed to stay off the treadmill for 3 weeks as well.  We also talked about making sure she continues to exercise to stay out of her depression. Realitos called and said she will not be here for the next two weeks.  She also left message that her physician has started her with PT for her knee.             Exercise Goals and Review:  Exercise Goals    Row Name 04/29/20 0909             Exercise Goals   Increase Physical Activity Yes       Intervention Provide advice, education, support and counseling about physical activity/exercise needs.;Develop an individualized exercise prescription for aerobic and resistive training based on initial evaluation findings, risk stratification, comorbidities and participant's personal goals.       Expected Outcomes Short Term: Attend rehab on a regular basis to increase amount of physical activity.;Long Term: Add in home exercise to make exercise part of routine and to increase amount of physical activity.;Long Term: Exercising regularly at least 3-5 days a week.       Increase Strength and Stamina Yes       Intervention Provide advice, education, support and counseling about physical activity/exercise needs.;Develop an individualized exercise prescription for aerobic and resistive training based on initial evaluation findings, risk stratification, comorbidities and participant's personal goals.       Expected Outcomes Short Term: Increase workloads from initial exercise prescription for resistance, speed, and METs.;Short Term: Perform resistance training exercises routinely during rehab and add in resistance training at home;Long Term: Improve cardiorespiratory fitness, muscular endurance and strength as measured by increased METs and functional capacity (6MWT)       Able to understand and use rate of perceived exertion (RPE) scale Yes       Intervention Provide education and explanation on how to use RPE scale       Expected Outcomes Short Term: Able to use  RPE daily in rehab to express subjective intensity level;Long Term:  Able to use RPE to guide intensity level when exercising independently       Able to understand and use Dyspnea scale Yes       Intervention Provide education and explanation on how to use Dyspnea scale       Expected Outcomes Short Term: Able to use Dyspnea scale daily in rehab to express subjective sense of shortness of breath during exertion;Long Term: Able to  use Dyspnea scale to guide intensity level when exercising independently       Knowledge and understanding of Target Heart Rate Range (THRR) Yes       Intervention Provide education and explanation of THRR including how the numbers were predicted and where they are located for reference       Expected Outcomes Short Term: Able to state/look up THRR;Long Term: Able to use THRR to govern intensity when exercising independently;Short Term: Able to use daily as guideline for intensity in rehab       Able to check pulse independently Yes       Intervention Provide education and demonstration on how to check pulse in carotid and radial arteries.;Review the importance of being able to check your own pulse for safety during independent exercise       Expected Outcomes Short Term: Able to explain why pulse checking is important during independent exercise;Long Term: Able to check pulse independently and accurately       Understanding of Exercise Prescription Yes       Intervention Provide education, explanation, and written materials on patient's individual exercise prescription       Expected Outcomes Short Term: Able to explain program exercise prescription;Long Term: Able to explain home exercise prescription to exercise independently              Exercise Goals Re-Evaluation :  Exercise Goals Re-Evaluation    Row Name 05/01/20 0755 05/13/20 0805 05/26/20 1804 06/10/20 1632 07/01/20 0809     Exercise Goal Re-Evaluation   Exercise Goals Review Increase Physical  Activity;Able to understand and use rate of perceived exertion (RPE) scale;Knowledge and understanding of Target Heart Rate Range (THRR);Understanding of Exercise Prescription;Able to check pulse independently;Increase Strength and Stamina Increase Physical Activity;Able to understand and use rate of perceived exertion (RPE) scale;Knowledge and understanding of Target Heart Rate Range (THRR);Understanding of Exercise Prescription;Able to check pulse independently;Increase Strength and Stamina;Able to understand and use Dyspnea scale Increase Physical Activity;Able to understand and use rate of perceived exertion (RPE) scale;Knowledge and understanding of Target Heart Rate Range (THRR);Understanding of Exercise Prescription;Able to check pulse independently;Increase Strength and Stamina;Able to understand and use Dyspnea scale - Increase Physical Activity;Able to understand and use rate of perceived exertion (RPE) scale;Knowledge and understanding of Target Heart Rate Range (THRR);Understanding of Exercise Prescription;Able to check pulse independently;Increase Strength and Stamina;Able to understand and use Dyspnea scale   Comments Reviewed RPE and dyspnea scales, THR and program prescription with pt today.  Pt voiced understanding and was given a copy of goals to take home. Reviewed home exercise with pt today.  Pt plans to walk at home for exercise.  Reviewed THR, pulse, RPE, sign and symptoms, pulse oximetery and when to call 911 or MD.  Also discussed weather considerations and indoor options.  Pt voiced understanding.  Felita is going to get back to her exercise routine. Raymonde has a bruised meniscus and her Dr recommended her not do the TM for now.  Staff will monitor progress. out since last review Emalyn is back for the first time since her last review. She has not been exercising at home due to knee problems - starting PT on Friday. MD says she can walk on a flat surface and seated machines that don't  irritate her knee (XR hurt her knee). Will monitor to see how she progresses and re-evaluate next goal review.   Expected Outcomes Short: Use RPE daily to regulate intensity. Long: Follow program prescription in THR.  Short: Get back to exericse routine.  Long: Continue to improve stamina. Short:  maintain consistent exercise Long: get back to walking - ST: attend rehab and PT LT: exercise 150 minutes of moderate or 70 minutes of vigorous exercise per week with at least 2 days of weight training.   [place] Name 07/08/20 4098 07/09/20 0859 08/06/20 0827 09/17/20 1357       Exercise Goal Re-Evaluation   Exercise Goals Review Increase Physical Activity;Increase Strength and Stamina Increase Physical Activity;Increase Strength and Stamina;Understanding of Exercise Prescription - Increase Physical Activity;Increase Strength and [place] is doing PT on M/W in addition to her LW sessions.  Today she is doing the T5 and walking track. Rilea is doing ewll in rehab. She is doing fine with T5 NuStep and her knee is feeling better. We will continue to monitor her progress. Out with knee since last review. Jahni is just getting back into exercise after being for several weeks.  We will monitor progress.    Expected Outcomes Short: continue PT and LW Long: increase stamina Short: Continue to use PT for knee Long; Continue to improve stamina. - Short: get back to regular attendance Long: improve overall stamina           Discharge Exercise Prescription (Final Exercise Prescription Changes):  Exercise Prescription Changes - 09/17/20 1300      Response to Exercise   Blood Pressure (Admit) 112/76    Blood Pressure (Exercise) 126/64    Blood Pressure (Exit) 114/68    Heart Rate (Admit) 81 bpm    Heart Rate (Exercise) 98 bpm    Heart Rate (Exit) 87 bpm    Oxygen Saturation (Admit) 98 %    Oxygen Saturation (Exercise) 97 %    Oxygen Saturation (Exit) 99 %    Rating of Perceived Exertion  (Exercise) 13    Perceived Dyspnea (Exercise) 1    Duration Continue with 30 min of aerobic exercise without signs/symptoms of physical distress.    Intensity THRR unchanged      Progression   Progression Continue to progress workloads to maintain intensity without signs/symptoms of physical distress.    Average METs 2.55      Resistance Training   Training Prescription Yes    Weight 4 lb    Reps 10-15      Interval Training   Interval Training No      T5 Nustep   Level 5    METs 3.1      Biostep-RELP   Level 4    SPM 50    Minutes 15    METs 2      Home Exercise Plan   Plans to continue exercise at Home (comment)   walking, gym   Frequency Add 2 additional days to program exercise sessions.    Initial Home Exercises Provided 05/13/20           Nutrition:  Target Goals: Understanding of nutrition guidelines, daily intake of sodium <1527m, cholesterol <2087m calories 30% from fat and 7% or less from saturated fats, daily to have 5 or more servings of fruits and vegetables.  Education: All About Nutrition: -Group instruction provided by verbal, written material, interactive activities, discussions, models, and posters to present general guidelines for heart healthy nutrition including fat, fiber, MyPlate, the role of sodium in heart healthy nutrition, utilization of the nutrition label, and utilization of this knowledge for meal planning. Follow up email sent as well. Written material given at graduation.  Flowsheet Row Pulmonary Rehab from 09/04/2020 in [facility]  Date 07/10/20  Educator [facility]  Instruction Review Code 1- Verbalizes Understanding      Biometrics:  Pre Biometrics - 04/29/20 0909      Pre Biometrics   Height 5' 5.7" (1.669 m)    Weight 251 lb 1.6 oz (113.9 kg)    BMI (Calculated) 40.89    Single Leg Stand 6.47 seconds            Nutrition Therapy Plan and Nutrition Goals:  Nutrition Therapy & Goals - 07/08/20 0849       Nutrition Therapy   Diet heart healthy, low Na    Protein (specify units) 90g    Fiber 25 grams    Whole Grain Foods 3 servings    Saturated Fats 12 max. grams    Fruits and Vegetables 8 servings/day    Sodium 1.5 grams      Personal Nutrition Goals   Nutrition Goal ST; include 2 additional vegetables or fruit in snacks or breakfast, try 3 new vegetables for dinner meals - interested in honey-nut squash LT: include 8 fruits or vegetables every day (or most days), include a variety (all the colors most weeks)    Comments B: Bagel (cream cheese - sometimes strawberry low-fat) or cinnamon and raisin (peanut butter - no sugar or salt) or scrambled egg (2 eggs) - whole wheat bread dry.  S: pack of peanut butter crackers. L: ham sandwich or pimento cheese sandwich and chips on whole wheat.  S: another pack of peanut butter crackers D: sometimes eat out. At home chicken and green beans (airfrier and frozen vegetables) or another bagel. Doesn't use fat to cook - maybe some oil and garlic, salt and pepper on green beans. No longer in weight watchers. Vegetables: green beans, corn, potatoes, peas, black beans, butter beans. Drink: tea - unsweet. water with crystal light. Coffee x2 - stevia and coffee mate (creamer). Reviewed heart healthy eating and the importance of getting enough vegetables and a variety as well as fat with meals to absorb fat-soluble vitamins.      Intervention Plan   Intervention Prescribe, educate and counsel regarding individualized specific dietary modifications aiming towards targeted core components such as weight, hypertension, lipid management, diabetes, heart failure and other comorbidities.;Nutrition handout(s) given to patient.    Expected Outcomes Short Term Goal: Understand basic principles of dietary content, such as calories, fat, sodium, cholesterol and nutrients.;Short Term Goal: A plan has been developed with personal nutrition goals set during dietitian appointment.;Long  Term Goal: Adherence to prescribed nutrition plan.           Nutrition Assessments:  Nutrition Assessments - 04/29/20 0851      MEDFICTS Scores   Pre Score 42          MEDIFICTS Score Key:  ?70 Need to make dietary changes   40-70 Heart Healthy Diet  ? 40 Therapeutic Level Cholesterol Diet   Picture Your Plate Scores:  <81 Unhealthy dietary pattern with much room for improvement.  41-50 Dietary pattern unlikely to meet recommendations for good health and room for improvement.  51-60 More healthful dietary pattern, with some room for improvement.   >60 Healthy dietary pattern, although there may be some specific behaviors that could be improved.   Nutrition Goals Re-Evaluation:   Nutrition Goals Discharge (Final Nutrition Goals Re-Evaluation):   Psychosocial: Target Goals: Acknowledge presence or absence of significant depression and/or stress, maximize coping skills, provide positive  support system. Participant is able to verbalize types and ability to use techniques and skills needed for reducing stress and depression.   Education: Stress, Anxiety, and Depression - Group verbal and visual presentation to define topics covered.  Reviews how body is impacted by stress, anxiety, and depression.  Also discusses healthy ways to reduce stress and to treat/manage anxiety and depression.  Written material given at graduation.   Education: Sleep Hygiene -Provides group verbal and written instruction about how sleep can affect your health.  Define sleep hygiene, discuss sleep cycles and impact of sleep habits. Review good sleep hygiene tips.    Initial Review & Psychosocial Screening:  Initial Psych Review & Screening - 04/29/20 0758      Initial Review   Current issues with Current Stress Concerns;History of Depression;Current Depression;Current Sleep Concerns    Source of Stress Concerns Chronic Illness;Financial    Comments PHQ is 5, no feeling of depression  currently, not sleeping great 4-5 hours a night (going to bed late and up early), finances have gotten better      [place]? Yes   Sharla Kidney lives near by, [place] friend April (moving this weekend)   Concerns Inappropriate over/under dependence on family/friends      Barriers   Psychosocial barriers to participate in program The patient should benefit from training in stress management and relaxation.;Psychosocial barriers identified (see note)      Screening Interventions   Interventions Provide feedback about the scores to participant;To provide support and resources with identified psychosocial needs;Encouraged to exercise    Expected Outcomes Short Term goal: Utilizing psychosocial counselor, staff and physician to assist with identification of specific Stressors or current issues interfering with healing process. Setting desired goal for each stressor or current issue identified.;Long Term Goal: Stressors or current issues are controlled or eliminated.;Short Term goal: Identification and review with participant of any Quality of Life or Depression concerns found by scoring the questionnaire.;Long Term goal: The participant improves quality of Life and PHQ9 Scores as seen by post scores and/or verbalization of changes           Quality of Life Scores:  Scores of 19 and below usually indicate a poorer quality of life in these areas.  A difference of  2-3 points is a clinically meaningful difference.  A difference of 2-3 points in the total score of the Quality of Life Index has been associated with significant improvement in overall quality of life, self-image, physical symptoms, and general health in studies assessing change in quality of life.  PHQ-9: Recent Review Flowsheet Data    Depression screen [facility] 2/9 09/02/2020 05/13/2020 04/29/2020 12/24/2019 12/10/2019   Decreased Interest 1 0 0 0 2   Down, Depressed, Hopeless 1 0 0 0 3   PHQ - 2 Score 2 0 0 0 5    Altered sleeping _0 Tired, decreased energy _1 Change in appetite 2 1 0 0 0   Feeling bad or failure about yourself  1 0 0 0 1   Trouble concentrating _2 Moving slowly or fidgety/restless 0 0 0 0 0   Suicidal thoughts 0 0 0 0 0   PHQ-9 Score _3 Difficult doing work/chores Somewhat difficult Somewhat difficult Somewhat difficult Not difficult at all Somewhat difficult     Interpretation of Total Score  Total Score  Depression Severity:  1-4 = Minimal depression, 5-9 = Mild depression, 10-14 = Moderate depression, 15-19 = Moderately severe depression, 20-27 = Severe depression   Psychosocial Evaluation and Intervention:  Psychosocial Evaluation - 04/29/20 0800      Psychosocial Evaluation & Interventions   Interventions Stress management education;Encouraged to exercise with the program and follow exercise prescription    Comments [place] is coming back to pulmonary rehab with her asthma that has gotten worse since surgery.  She wants to focus on getting on her nebulizer and get back to walking longer.  She has a few new friends, but one is moving away this weekend.  She wants to get her weight back down too. Her finances are doing better.  Sleep continues to struggle for her.    Expected Outcomes Short: Attend rehab to improve breathing and endurance  Long: Continue to focus on positive and use mental boost    Continue Psychosocial Services  Follow up required by staff           Psychosocial Re-Evaluation:  Psychosocial Re-Evaluation    Row Name 05/13/20 0757 07/01/20 0814 07/08/20 0947         Psychosocial Re-Evaluation   Current issues with Current Stress Concerns;Current Sleep Concerns History of Depression;Current Psychotropic Meds;Current Depression;Current Sleep Concerns -     Comments Bahar is doing well in rehab.  She is sleeping pretty good, but still is staying up late.  She is trying to get better.  Her cat is doing better to with  sleep.  She is going to get started on her home exercise again.  Her PHQ is the same, but shifting around some. She is on Effexor to manage depression. She reports no symptoms of depression in last couple of weeks and does not go to talk therapy. She reports that she can't find anyone for talk therapy. Discussed some options to look for therapists including [facility] since she is not having luck finding anyone in person who is taking new patients. She will read, crochet to reduce stress. She reports getting 4-5 hours of sleep each night - sleep maintenance insomia - tried melatonin, but didn't see any difference. Discussed sleep hygiene. Quamesha says she has good and bad days as far as depression.  She still is on Effexor.  She has spoken to her Dr about sleep and has a prescirption for trazadone but has only tried it once.  We discussed trying some realxing music if she wakes up to help her get back to sleep.     Expected Outcomes Short: Get back to exercise routine for mood boost  Long: Continue to focus on positive. ST: Find therapist, try sleep hygiene techniques. LT: continue to manage depression, sleep throughout the night. Short: try music to help with sleep long: develop better sleep patterns     Interventions Encouraged to attend Pulmonary Rehabilitation for the exercise Encouraged to attend Pulmonary Rehabilitation for the exercise -     Continue Psychosocial Services  Follow up required by staff Follow up required by staff -     Comments - She reports no current stress concerns, she is still working to manage her depression and sleep. -            Psychosocial Discharge (Final Psychosocial Re-Evaluation):  Psychosocial Re-Evaluation - 07/08/20 0822      Psychosocial Re-Evaluation   Comments Clare says she has good and bad days as far as depression.  She still is on Effexor.  She has spoken  to her Dr about sleep and has a prescirption for trazadone but has only tried it once.  We discussed  trying some realxing music if she wakes up to help her get back to sleep.    Expected Outcomes Short: try music to help with sleep long: develop better sleep patterns           Education: Education Goals: Education classes will be provided on a weekly basis, covering required topics. Participant will state understanding/return demonstration of topics presented.  Learning Barriers/Preferences:  Learning Barriers/Preferences - 04/29/20 0803      Learning Barriers/Preferences   Learning Barriers None    Learning Preferences None           General Pulmonary Education Topics:  Infection Prevention: - Provides verbal and written material to individual with discussion of infection control including proper hand washing and proper equipment cleaning during exercise session. Flowsheet Row Pulmonary Rehab from 09/04/2020 in [facility]monary Rehab  Date 04/29/20  Educator [facility]  Instruction Review Code 1- Verbalizes Understanding      Falls Prevention: - Provides verbal and written material to individual with discussion of falls prevention and safety. Flowsheet Row Pulmonary Rehab from 09/04/2020 in [facility] Cardiac and Pulmonary Rehab  Date 04/29/20  Educator [facility]  Instruction Review Code 1- Verbalizes Understanding      Chronic Lung Disease Review: - Group verbal instruction with posters, models, PowerPoint presentations and videos,  to review new updates, new respiratory medications, new advancements in procedures and treatments. Providing information on websites and "800" numbers for continued self-education. Includes information about supplement oxygen, available portable oxygen systems, continuous and intermittent flow rates, oxygen safety, concentrators, and Medicare reimbursement for oxygen. Explanation of Pulmonary Drugs, including class, frequency, complications, importance of spacers, rinsing mouth after steroid MDI's, and proper cleaning methods for nebulizers. Review of basic  lung anatomy and physiology related to function, structure, and complications of lung disease. Review of risk factors. Discussion about methods for diagnosing sleep apnea and types of masks and machines for OSA. Includes a review of the use of types of environmental controls: home humidity, furnaces, filters, dust mite/pet prevention, HEPA vacuums. Discussion about weather changes, air quality and the benefits of nasal washing. Instruction on Warning signs, infection symptoms, calling MD promptly, preventive modes, and value of vaccinations. Review of effective airway clearance, coughing and/or vibration techniques. Emphasizing that all should Create an Action Plan. Written material given at graduation. Flowsheet Row Pulmonary Rehab from 09/04/2020 in [facility] Cardiac and Pulmonary Rehab  Date 04/29/20  Instruction Review Code 3- Needs Reinforcement  [need identified]      AED/CPR: - Group verbal and written instruction with the use of models to demonstrate the basic use of the AED with the basic ABC's of resuscitation.    Anatomy and Cardiac Procedures: - Group verbal and visual presentation and models provide information about basic cardiac anatomy and function. Reviews the testing methods done to diagnose heart disease and the outcomes of the test results. Describes the treatment choices: Medical Management, Angioplasty, or Coronary Bypass Surgery for treating various heart conditions including Myocardial Infarction, Angina, Valve Disease, and Cardiac Arrhythmias.  Written material given at graduation.   Medication Safety: - Group verbal and visual instruction to review commonly prescribed medications for heart and lung disease. Reviews the medication, class of the drug, and side effects. Includes the steps to properly store meds and maintain the prescription regimen.  Written material given at graduation.   Other: -Provides group and verbal  instruction on various topics (see  comments)   Knowledge Questionnaire Score:  Knowledge Questionnaire Score - 04/29/20 0851      Knowledge Questionnaire Score   Pre Score 16/18 Education Focus: O2 safety            Core Components/Risk Factors/Patient Goals at Admission:  Personal Goals and Risk Factors at Admission - 04/29/20 0803      Core Components/Risk Factors/Patient Goals on Admission    Weight Management Yes;Obesity;Weight Loss    Intervention Weight Management: Develop a combined nutrition and exercise program designed to reach desired caloric intake, while maintaining appropriate intake of nutrient and fiber, sodium and fats, and appropriate energy expenditure required for the weight goal.;Weight Management/Obesity: Establish reasonable short term and long term weight goals.;Weight Management: Provide education and appropriate resources to help participant work on and attain dietary goals.;Obesity: Provide education and appropriate resources to help participant work on and attain dietary goals.    Admit Weight 251 lb 1.6 oz (113.9 kg)    Goal Weight: Short Term 245 lb (111.1 kg)    Goal Weight: Long Term 240 lb (108.9 kg)    Expected Outcomes Short Term: Continue to assess and modify interventions until short term weight is achieved;Long Term: Adherence to nutrition and physical activity/exercise program aimed toward attainment of established weight goal;Weight Loss: Understanding of general recommendations for a balanced deficit meal plan, which promotes 1-2 lb weight loss per week and includes a negative energy balance of [phone] kcal/d;Understanding recommendations for meals to include 15-35% energy as protein, 25-35% energy from fat, 35-60% energy from carbohydrates, less than $RemoveB'200mg'RKpjmUlJ$  of dietary cholesterol, 20-35 gm of total fiber daily;Understanding of distribution of calorie intake throughout the day with the consumption of 4-5 meals/snacks    Improve shortness of breath with ADL's Yes    Intervention Provide  education, individualized exercise plan and daily activity instruction to help decrease symptoms of SOB with activities of daily living.    Expected Outcomes Short Term: Improve cardiorespiratory fitness to achieve a reduction of symptoms when performing ADLs;Long Term: Be able to perform more ADLs without symptoms or delay the onset of symptoms    Hypertension Yes    Intervention Provide education on lifestyle modifcations including regular physical activity/exercise, weight management, moderate sodium restriction and increased consumption of fresh fruit, vegetables, and low fat dairy, alcohol moderation, and smoking cessation.;Monitor prescription use compliance.    Expected Outcomes Long Term: Maintenance of blood pressure at goal levels.;Short Term: Continued assessment and intervention until BP is < 140/58mm HG in hypertensive participants. < 130/50mm HG in hypertensive participants with diabetes, heart failure or chronic kidney disease.    Lipids Yes    Intervention Provide education and support for participant on nutrition & aerobic/resistive exercise along with prescribed medications to achieve LDL '70mg'$ , HDL >$Remo'40mg'vgSPg$ .    Expected Outcomes Short Term: Participant states understanding of desired cholesterol values and is compliant with medications prescribed. Participant is following exercise prescription and nutrition guidelines.;Long Term: Cholesterol controlled with medications as prescribed, with individualized exercise RX and with personalized nutrition plan. Value goals: LDL < $Rem'70mg'MOtQ$ , HDL > 40 mg.           Education:Diabetes - Individual verbal and written instruction to review signs/symptoms of diabetes, desired ranges of glucose level fasting, after meals and with exercise. Acknowledge that pre and post exercise glucose checks will be done for 3 sessions at entry of program.   Know Your Numbers and Heart Failure: - Group verbal and visual instruction to  discuss disease risk factors for  cardiac and pulmonary disease and treatment options.  Reviews associated critical values for Overweight/Obesity, Hypertension, Cholesterol, and Diabetes.  Discusses basics of heart failure: signs/symptoms and treatments.  Introduces Heart Failure Zone chart for action plan for heart failure.  Written material given at graduation.   Core Components/Risk Factors/Patient Goals Review:   Goals and Risk Factor Review    Row Name 05/13/20 0800 07/01/20 0826 07/08/20 0809         Core Components/Risk Factors/Patient Goals Review   Personal Goals Review Weight Management/Obesity;Improve shortness of breath with ADL's;Hypertension;Lipids Weight Management/Obesity;Improve shortness of breath with ADL's;Hypertension Weight Management/Obesity;Improve shortness of breath with ADL's;Hypertension     Review Ralyn is off to a good start in rehab.  Her weight was up today as she was eating over the weekend.  She is determined to get back to exercise to help curb her eating.  Blood pressures have been doing good in class but she is not checking at home since they have been doing better.  Her breathing is still a bit of a stuggle recently. She is trying to get her neublizer im She reports her weight has been stable - has not met with RD regarding nutrition yet. She reports SOB is good unless she has a coughing spell - hasn't had to use her inhaler in a week or two. She only takes nebulizer in morning because it makes her shake. She reports her BP has been good at rehab, she doesn't have a BP cuff , but will buy one. SOB has been ok lately except when she has a coughing spell.  She is doing PT for knee and was advised not to do TM for now, but can walk on track.  She is taking meds as directed.  She is going to order a BP cuff ffrom [facility] today.     Expected Outcomes Short: Get back to nebulizer  Long: Work on weight loss. ST: Get BP cuff, continue to take nebulizer. LT: meet with RD Short: order BP cuff, meet with RD  today Long: monitor BP at home, follow RD recommendations            Core Components/Risk Factors/Patient Goals at Discharge (Final Review):   Goals and Risk Factor Review - 07/08/20 0809      Core Components/Risk Factors/Patient Goals Review   Personal Goals Review Weight Management/Obesity;Improve shortness of breath with ADL's;Hypertension    Review SOB has been ok lately except when she has a coughing spell.  She is doing PT for knee and was advised not to do TM for now, but can walk on track.  She is taking meds as directed.  She is going to order a BP cuff ffrom [facility] today.    Expected Outcomes Short: order BP cuff, meet with RD today Long: monitor BP at home, follow RD recommendations           ITP Comments:  ITP Comments    Row Name 04/29/20 0805 05/01/20 0755 05/06/20 0717 05/07/20 1548 05/22/20 0746   ITP Comments Completed 6MWT and gym orientation. Initial ITP created and sent for review to Dr. Emily Filbert, Medical Director. Documentaiton for diagnosis can be found in Care Everywhere encounter 04/10/2020. First full day of exercise!  Patient was oriented to gym and equipment including functions, settings, policies, and procedures.  Patient's individual exercise prescription and treatment plan were reviewed.  All starting workloads were established based on the results of the 6 minute walk  test done at initial orientation visit.  The plan for exercise progression was also introduced and progression will be customized based on patient's performance and goals pt fell and hurt knee at home yesterday 30 day review completed. ITP sent to Dr. Emily Filbert, Medical Director of Cardiac and Pulmonary Rehab. Continue with ITP unless changes are made by physician. Raymond saw the doctor yesterday and they started her on prednisone for her knee.  She is also supposed to stay off the treadmill for 3 weeks as well.   [place] Name 06/04/20 0548 06/10/20 1631 06/17/20 0719 06/25/20 1453 07/02/20 0640    ITP Comments 30 Day review completed. Medical Director ITP review done, changes made as directed, and signed approval by Medical Director. Gursimran has called out the past couple of weeks with knee pain.  She is supposed to see her doctor this week to see what is going on.  She will be out of town on Thursday. Pillow called and said she will not be here for the next two weeks.  She also left message that her physician has started her with PT for her knee. Pt has not attended since last review. 30 Day review completed. Medical Director ITP review done, changes made as directed, and signed approval by Medical Director.   [place] Name 07/22/20 1304 07/22/20 1531 07/30/20 0940 08/06/20 0827 08/27/20 0555   ITP Comments Pt has not attended since last review. Jeselle will have surgery for a hernia tomorrow.  She will let staff know when she is cleared to return to exercise. 30 Day review completed. Medical Director ITP review done, changes made as directed, and signed approval by Medical Director. Called to check on patient.  She saw surgeon yesterday and still having fluid built up around knee.  They want her to stay off her knee for about 4 weeks. They may have to drain it if it doesn't improve.  We will cancel appointments through the end of the year and she hopes to be able to return in January. 30 Day review completed. Medical Director ITP review done, changes made as directed, and signed approval by Medical Director.  Remains out with knee problems   Row Name 09/18/20 1232 09/23/20 1212 09/24/20 0918       ITP Comments [place] called 09/17/20 to let us know that she had tested positive for COVID.  She will be out until 09/30/20 Vickii has only been to rehab a couple of times since last review - she is now out with COVID - unable to get goals this round. 30 Day review completed. Medical Director ITP review done, changes made as directed, and signed approval by Medical Director.            Comments:

## 2020-09-29 ENCOUNTER — Encounter: Payer: Self-pay | Admitting: *Deleted

## 2020-09-29 DIAGNOSIS — J4550 Severe persistent asthma, uncomplicated: Secondary | ICD-10-CM

## 2020-09-30 ENCOUNTER — Other Ambulatory Visit: Payer: Self-pay

## 2020-09-30 ENCOUNTER — Encounter: Payer: Medicare Other | Attending: Pulmonary Disease | Admitting: *Deleted

## 2020-09-30 DIAGNOSIS — J4550 Severe persistent asthma, uncomplicated: Secondary | ICD-10-CM | POA: Insufficient documentation

## 2020-09-30 DIAGNOSIS — I272 Pulmonary hypertension, unspecified: Secondary | ICD-10-CM | POA: Diagnosis present

## 2020-09-30 NOTE — Progress Notes (Signed)
Daily Session Note  Patient Details  Name: Sharon Cervantes MRN: [id number] Date of Birth: [birth year] Referring Provider:   Flowsheet Row Pulmonary Rehab from 04/29/2020 in [facility] Cardiac and Pulmonary Rehab  Referring Provider Claudette Stapler MD      Encounter Date: 09/30/2020  Check In:  Session Check In - 09/30/20 0814      Check-In   Supervising physician immediately available to respond to emergencies See telemetry face sheet for immediately available ER MD    Location [facility]-Cardiac & Pulmonary Rehab    Staff Present Heath Lark, RN, BSN, Dorris Singh, MPA, Nino Glow, MS Exercise Physiologist    Virtual Visit No    Medication changes reported     No    Fall or balance concerns reported    No    Warm-up and Cool-down Performed on first and last piece of equipment    Resistance Training Performed Yes    VAD Patient? No    PAD/SET Patient? No      Pain Assessment   Currently in Pain? No/denies              Social History   Tobacco Use  Smoking Status Never Smoker  Smokeless Tobacco Never Used    Goals Met:  Proper associated with RPD/PD & O2 Sat Independence with exercise equipment Exercise tolerated well No report of cardiac concerns or symptoms  Goals Unmet:  Not Applicable  Comments: Pt able to follow exercise prescription today without complaint.  Will continue to monitor for progression.    Dr. Emily Filbert is Medical Director for [place] and [name].

## 2020-10-16 ENCOUNTER — Other Ambulatory Visit: Payer: Self-pay

## 2020-10-16 DIAGNOSIS — J4550 Severe persistent asthma, uncomplicated: Secondary | ICD-10-CM | POA: Diagnosis not present

## 2020-10-16 DIAGNOSIS — I272 Pulmonary hypertension, unspecified: Secondary | ICD-10-CM

## 2020-10-16 NOTE — Progress Notes (Signed)
Daily Session Note  Patient Details  Name: Sharon Cervantes MRN: [id number] Date of Birth: 19-Mar-1951 Referring Provider:   Flowsheet Row Pulmonary Rehab from 04/29/2020 in [facility] Cardiac and Pulmonary Rehab  Referring Provider Claudette Stapler MD      Encounter Date: 10/16/2020  Check In:  Session Check In - 10/16/20 0923      Check-In   Supervising physician immediately available to respond to emergencies See telemetry face sheet for immediately available ER MD    Location [facility]-Cardiac & Pulmonary Rehab    Staff Present Birdie Sons, MPA, RN;Melissa Caiola RDN, Rowe Pavy, BA, ACSM CEP, Exercise Physiologist    Virtual Visit No    Medication changes reported     No    Fall or balance concerns reported    No    Tobacco Cessation No Change    Current number of cigarettes/nicotine per day     0    Warm-up and Cool-down Performed on first and last piece of equipment    Resistance Training Performed Yes    VAD Patient? No    PAD/SET Patient? No      Pain Assessment   Currently in Pain? No/denies              Social History   Tobacco Use  Smoking Status Never Smoker  Smokeless Tobacco Never Used    Goals Met:  Independence with exercise equipment Exercise tolerated well No report of cardiac concerns or symptoms Strength training completed today  Goals Unmet:  Not Applicable  Comments: Pt able to follow exercise prescription today without complaint.  Will continue to monitor for progression.    Dr. Emily Filbert is Medical Director for [place] and [name].

## 2020-10-22 ENCOUNTER — Encounter: Payer: Self-pay | Admitting: *Deleted

## 2020-10-22 DIAGNOSIS — J4550 Severe persistent asthma, uncomplicated: Secondary | ICD-10-CM

## 2020-10-22 NOTE — Progress Notes (Signed)
Pulmonary Individual Treatment Plan  Patient Details  Name: Sharon Cervantes MRN: [id number] Date of Birth: 11/19/1950 Referring Provider:   Flowsheet Row Pulmonary Rehab from 04/29/2020 in [facility]  Referring Provider Aleskerov, Fred MD      Initial Encounter Date:  Flowsheet Row Pulmonary Rehab from 04/29/2020 in [facility]  Date 04/29/20      Visit Diagnosis: Severe persistent asthma, unspecified whether complicated  Patient's Home Medications on Admission:  Current Outpatient Medications:  .  albuterol (PROVENTIL HFA;VENTOLIN HFA) 108 (90 Base) MCG/ACT inhaler, Inhale 2 puffs into the lungs every 6 (six) hours as needed for wheezing or shortness of breath. , Disp: , Rfl:  .  albuterol (PROVENTIL) (2.5 MG/3ML) 0.083% nebulizer solution, Take 2.5 mg by nebulization 2 (two) times daily as needed for wheezing or shortness of breath., Disp: , Rfl:  .  budesonide (PULMICORT) 0.5 MG/2ML nebulizer solution, Take 0.5 mg by nebulization 2 (two) times daily as needed (shortness of breath or wheezing). , Disp: , Rfl:  .  cyanocobalamin (,VITAMIN B-12,) 1000 MCG/ML injection, Inject 1,000 mcg into the muscle every 30 (thirty) days., Disp: , Rfl:  .  diclofenac (VOLTAREN) 50 MG EC tablet, Take 50 mg by mouth 2 (two) times daily., Disp: , Rfl:  .  diclofenac Sodium (VOLTAREN) 1 % GEL, Apply 1 application topically 4 (four) times daily as needed (pain)., Disp: , Rfl:  .  esomeprazole (NEXIUM) 40 MG capsule, Take 40 mg by mouth daily. , Disp: , Rfl:  .  FLUoxetine (PROZAC) 10 MG tablet, Take 10 mg by mouth daily., Disp: , Rfl:  .  furosemide (LASIX) 20 MG tablet, Take 20 mg by mouth daily as needed for edema., Disp: , Rfl:  .  gabapentin (NEURONTIN) 300 MG capsule, Take 300 mg by mouth 3 (three) times daily. , Disp: , Rfl:  .  hydrochlorothiazide (HYDRODIURIL) 12.5 MG tablet, Take 12.5 mg by mouth daily. , Disp: , Rfl:  .  HYDROcodone-acetaminophen (NORCO)  5-325 MG tablet, Take 1 tablet by mouth every 6 (six) hours as needed for up to 6 doses for moderate pain., Disp: 6 tablet, Rfl: 0 .  HYDROcodone-homatropine (HYCODAN) 5-1.5 MG/5ML syrup, Take 5 mLs by mouth every 6 (six) hours as needed for cough., Disp: , Rfl:  .  ibuprofen (ADVIL) 200 MG tablet, Take 800 mg by mouth every 6 (six) hours as needed for headache or moderate pain., Disp: , Rfl:  .  montelukast (SINGULAIR) 10 MG tablet, Take 1 tablet (10 mg total) by mouth at bedtime. appt further refills (Patient taking differently: Take 10 mg by mouth at bedtime. ), Disp: 90 tablet, Rfl: 1 .  oxymetazoline (AFRIN) 0.05 % nasal spray, Place 1 spray into both nostrils daily as needed (nose bleeds)., Disp: , Rfl:  .  pramipexole (MIRAPEX) 1.5 MG tablet, Take 1.5 mg by mouth at bedtime. , Disp: , Rfl:  .  traMADol (ULTRAM) 50 MG tablet, Take 50 mg by mouth every 8 (eight) hours as needed for moderate pain., Disp: , Rfl:  .  traZODone (DESYREL) 100 MG tablet, Take 100 mg by mouth at bedtime as needed for sleep., Disp: , Rfl:  .  venlafaxine XR (EFFEXOR-XR) 75 MG 24 hr capsule, Take 150 mg by mouth daily with breakfast. , Disp: , Rfl:  No current facility-administered medications for this visit.  Facility-Administered Medications Ordered in Other Visits:  .  [COMPLETED] sodium chloride 0.9 % nebulizer solution 3 mL, 3 mL,   Nebulization, Once, 3 mL at 10/03/18 1130 **FOLLOWED BY** [COMPLETED] methacholine (PROVOCHOLINE) inhaler solution 0.125 mg, 2 mL, Inhalation, Once, 0.125 mg at 10/03/18 1139 **FOLLOWED BY** methacholine (PROVOCHOLINE) inhaler solution 0.5 mg, 2 mL, Inhalation, Once **FOLLOWED BY** methacholine (PROVOCHOLINE) inhaler solution 2 mg, 2 mL, Inhalation, Once **FOLLOWED BY** methacholine (PROVOCHOLINE) inhaler solution 8 mg, 2 mL, Inhalation, Once **FOLLOWED BY** methacholine (PROVOCHOLINE) inhaler solution 32 mg, 2 mL, Inhalation, Once **FOLLOWED BY** [COMPLETED] albuterol (PROVENTIL) (2.5 MG/3ML)  0.083% nebulizer solution 2.5 mg, 2.5 mg, Nebulization, Once, Sharon Glazier, MD, 2.5 mg at 10/03/18 1146  Past Medical History: Past Medical History:  Diagnosis Date  . Arthritis   . Asthma   . Chronic cough   . Colon polyps   . Depression   . GERD (gastroesophageal reflux disease)   . Hiatal hernia    LARGE  . History of chicken pox   . Hypertension   . Macular degeneration   . Multinodular goiter    FNA in past neg h/o thyroid cysts  . Pernicious anemia    PERNICIOUS   . RLS (restless legs syndrome)   . Sleep apnea    NO CPAP    Tobacco Use: Social History   Tobacco Use  Smoking Status Never Smoker  Smokeless Tobacco Never Used    Labs: Recent Review Scientist, physiological    Labs for ITP Cardiac and Pulmonary Rehab Latest Ref Rng & Units 10/12/2018   Hemoglobin A1c 4.6 - 6.5 % 5.6       Pulmonary Assessment Scores:  Pulmonary Assessment Scores    Row Name 04/29/20 0834         ADL UCSD   ADL Phase Entry     SOB Score total 29     Rest 0     Walk 0     Stairs 5     Bath 0     Dress 0     Shop 1           CAT Score   CAT Score 26           mMRC Score   mMRC Score 1            UCSD: Self-administered rating of dyspnea associated with activities of daily living (ADLs) 6-point scale (0 = "not at all" to 5 = "maximal or unable to do because of breathlessness")  Scoring Scores range from 0 to 120.  Minimally important difference is 5 units  CAT: CAT can identify the health impairment of COPD patients and is better correlated with disease progression.  CAT has a scoring range of zero to 40. The CAT score is classified into four groups of low (less than 10), medium (10 - 20), high (21-30) and very high (31-40) based on the impact level of disease on health status. A CAT score over 10 suggests significant symptoms.  A worsening CAT score could be explained by an exacerbation, poor medication adherence, poor inhaler technique, or progression of COPD or  comorbid conditions.  CAT MCID is 2 points  mMRC: mMRC (Modified Medical Research Council) Dyspnea Scale is used to assess the degree of baseline functional disability in patients of respiratory disease due to dyspnea. No minimal important difference is established. A decrease in score of 1 point or greater is considered a positive change.   Pulmonary Function Assessment:  Pulmonary Function Assessment - 04/29/20 0756      Breath   Bilateral Breath Sounds Clear    Shortness of Breath  No;Limiting activity           Exercise Target Goals: Exercise Program Goal: Individual exercise prescription set using results from initial 6 min walk test and THRR while considering  patient's activity barriers and safety.   Exercise Prescription Goal: Initial exercise prescription builds to 30-45 minutes a day of aerobic activity, 2-3 days per week.  Home exercise guidelines will be given to patient during program as part of exercise prescription that the participant will acknowledge.  Education: Aerobic Exercise: - Group verbal and visual presentation on the components of exercise prescription. Introduces F.I.T.T principle from ACSM for exercise prescriptions.  Reviews F.I.T.T. principles of aerobic exercise including progression. Written material given at graduation.   Education: Resistance Exercise: - Group verbal and visual presentation on the components of exercise prescription. Introduces F.I.T.T principle from ACSM for exercise prescriptions  Reviews F.I.T.T. principles of resistance exercise including progression. Written material given at graduation.    Education: Exercise & Equipment Safety: - Individual verbal instruction and demonstration of equipment use and safety with use of the equipment. Flowsheet Row Pulmonary Rehab from 09/04/2020 in [facility] Pul[facility]  Date 04/29/20  Educator [facility]  Instruction Review Code 1- Verbalizes Understanding      Education: Exercise  Physiology & General Exercise Guidelines: - Group verbal and written instruction with models to review the exercise physiology of the cardiovascular system and associated critical values. Provides general exercise guidelines with specific guidelines to those with heart or lung disease.    Education: Flexibility, Balance, Mind/Body Relaxation: - Group verbal and visual presentation with interactive activity on the components of exercise prescription. Introduces F.I.T.T principle from ACSM for exercise prescriptions. Reviews F.I.T.T. principles of flexibility and balance exercise training including progression. Also discusses the mind body connection.  Reviews various relaxation techniques to help reduce and manage stress (i.e. Deep breathing, progressive muscle relaxation, and visualization). Balance handout provided to take home. Written material given at graduation. Flowsheet Row Pulmonary Rehab from 09/04/2020 in [facility] Cardiac and Pulmonary Rehab  Date 09/04/20  Educator AS  Instruction Review Code 1- Verbalizes Understanding      Activity Barriers & Risk Stratification:  Activity Barriers & Cardiac Risk Stratification - 04/29/20 0757      Activity Barriers & Cardiac Risk Stratification   Activity Barriers Deconditioning;Joint Problems;Other (comment);Muscular Weakness;Arthritis;Shortness of Breath;Right Knee Replacement;Balance Concerns    Comments TKR completed July 2021           6 Minute Walk:  6 Minute Walk    Row Name 04/29/20 0903         6 Minute Walk   Phase Initial     Distance 1320 feet     Walk Time 6 minutes     # of Rest Breaks 0     MPH 2.5     METS 2.34     RPE 13     Perceived Dyspnea  2     VO2 Peak 8.2     Symptoms Yes (comment)     Comments SOB     Resting HR 76 bpm     Resting BP 134/74     Resting Oxygen Saturation  95 %     Exercise Oxygen Saturation  during 6 min walk 89 %     Max Ex. HR 108 bpm     Max Ex. BP 144/66     2 Minute Post BP 136/70            Interval HR  1 Minute HR 100     2 Minute HR 98     3 Minute HR 100     4 Minute HR 106     5 Minute HR 101     6 Minute HR 108     2 Minute Post HR 86     Interval Heart Rate? Yes           Interval Oxygen   Interval Oxygen? Yes     Baseline Oxygen Saturation % 95 %     1 Minute Oxygen Saturation % 91 %     1 Minute Liters of Oxygen 0 L  Room Air     2 Minute Oxygen Saturation % 89 %     2 Minute Liters of Oxygen 0 L     3 Minute Oxygen Saturation % 92 %     3 Minute Liters of Oxygen 0 L     4 Minute Oxygen Saturation % 93 %     4 Minute Liters of Oxygen 0 L     5 Minute Oxygen Saturation % 94 %     5 Minute Liters of Oxygen 0 L     6 Minute Oxygen Saturation % 94 %     6 Minute Liters of Oxygen 0 L     2 Minute Post Oxygen Saturation % 96 %     2 Minute Post Liters of Oxygen 0 L           Oxygen Initial Assessment:  Oxygen Initial Assessment - 04/29/20 0756      Home Oxygen   Home Oxygen Device None    Sleep Oxygen Prescription None    Home Exercise Oxygen Prescription None    Home Resting Oxygen Prescription None      Initial 6 min Walk   Oxygen Used None      Program Oxygen Prescription   Program Oxygen Prescription None      Intervention   Short Term Goals To learn and understand importance of monitoring SPO2 with pulse oximeter and demonstrate accurate use of the pulse oximeter.;To learn and understand importance of maintaining oxygen saturations>88%;To learn and demonstrate proper pursed lip breathing techniques or other breathing techniques.;To learn and demonstrate proper use of respiratory medications    Long  Term Goals Verbalizes importance of monitoring SPO2 with pulse oximeter and return demonstration;Maintenance of O2 saturations>88%;Exhibits proper breathing techniques, such as pursed lip breathing or other method taught during program session;Compliance with respiratory medication;Demonstrates proper use of MDI's           Oxygen  Re-Evaluation:  Oxygen Re-Evaluation    Row Name 05/13/20 0802 07/01/20 0813           Program Oxygen Prescription   Program Oxygen Prescription None None             Home Oxygen   Home Oxygen Device None None      Sleep Oxygen Prescription None CPAP  She has not recieved it yet      Home Exercise Oxygen Prescription None None      Home Resting Oxygen Prescription None None             Goals/Expected Outcomes   Short Term Goals To learn and understand importance of monitoring SPO2 with pulse oximeter and demonstrate accurate use of the pulse oximeter.;To learn and understand importance of maintaining oxygen saturations>88%;To learn and demonstrate proper pursed lip breathing techniques or other breathing techniques.;To learn and demonstrate proper use  of respiratory medications To learn and understand importance of monitoring SPO2 with pulse oximeter and demonstrate accurate use of the pulse oximeter.;To learn and understand importance of maintaining oxygen saturations>88%;To learn and demonstrate proper pursed lip breathing techniques or other breathing techniques.;To learn and demonstrate proper use of respiratory medications      Long  Term Goals Verbalizes importance of monitoring SPO2 with pulse oximeter and return demonstration;Maintenance of O2 saturations>88%;Exhibits proper breathing techniques, such as pursed lip breathing or other method taught during program session;Compliance with respiratory medication;Demonstrates proper use of MDI's Verbalizes importance of monitoring SPO2 with pulse oximeter and return demonstration;Maintenance of O2 saturations>88%;Exhibits proper breathing techniques, such as pursed lip breathing or other method taught during program session;Compliance with respiratory medication;Demonstrates proper use of MDI's      Comments Sharon Cervantes is struggling with her breathing.  She is supposed to use her nebulizer twice a day but only getting once a day.  She is trying  to use her pursed lip breathing some.  She coughing more and needs her inhaler more often.  She is going to try to use her nebulizer the twice a day. Her sats have been good. Sharon Cervantes reports trying to remember to due pursed lip breathing, but it doesn't feel natural still. She takes her nebulizer in the morning, but not at night due to shaking.      Goals/Expected Outcomes Short: Nebulizer twice a day Long: Conitnue to work on improving breathing. ST: continue to practice PLB LT: use PLB during activity.             Oxygen Discharge (Final Oxygen Re-Evaluation):  Oxygen Re-Evaluation - 07/01/20 0813      Program Oxygen Prescription   Program Oxygen Prescription None      Home Oxygen   Home Oxygen Device None    Sleep Oxygen Prescription CPAP   She has not recieved it yet   Home Exercise Oxygen Prescription None    Home Resting Oxygen Prescription None      Goals/Expected Outcomes   Short Term Goals To learn and understand importance of monitoring SPO2 with pulse oximeter and demonstrate accurate use of the pulse oximeter.;To learn and understand importance of maintaining oxygen saturations>88%;To learn and demonstrate proper pursed lip breathing techniques or other breathing techniques.;To learn and demonstrate proper use of respiratory medications    Long  Term Goals Verbalizes importance of monitoring SPO2 with pulse oximeter and return demonstration;Maintenance of O2 saturations>88%;Exhibits proper breathing techniques, such as pursed lip breathing or other method taught during program session;Compliance with respiratory medication;Demonstrates proper use of MDI's    Comments Antavia reports trying to remember to due pursed lip breathing, but it doesn't feel natural still. She takes her nebulizer in the morning, but not at night due to shaking.    Goals/Expected Outcomes ST: continue to practice PLB LT: use PLB during activity.           Initial Exercise Prescription:  Initial  Exercise Prescription - 04/29/20 0900      Date of Initial Exercise RX and Referring Provider   Date 04/29/20    Referring Provider Claudette Stapler MD      Treadmill   MPH 2    Grade 0.5    Minutes 15    METs 2.67      REL-XR   Level 1    Speed 50    Minutes 15    METs 2.6      T5 Nustep   Level 2  SPM 80    Minutes 15    METs 2.6      Prescription Details   Frequency (times per week) 2    Duration Progress to 30 minutes of continuous aerobic without signs/symptoms of physical distress      Intensity   THRR 40-80% of Max Heartrate 106-135    Ratings of Perceived Exertion 11-13    Perceived Dyspnea 0-4      Progression   Progression Continue to progress workloads to maintain intensity without signs/symptoms of physical distress.      Resistance Training   Training Prescription Yes    Weight 3 lb    Reps 10-15           Perform Capillary Blood Glucose checks as needed.  Exercise Prescription Changes:  Exercise Prescription Changes    Row Name 04/29/20 0900 05/13/20 1500 05/26/20 1800 07/09/20 0900 09/17/20 1300     Response to Exercise   Blood Pressure (Admit) 134/74 120/72 122/66 116/70 112/76   Blood Pressure (Exercise) 144/66 136/74 132/78 120/66 126/64   Blood Pressure (Exit) 128/60 124/70 122/74 110/64 114/68   Heart Rate (Admit) 76 bpm 74 bpm 88 bpm 86 bpm 81 bpm   Heart Rate (Exercise) 108 bpm 89 bpm 97 bpm 108 bpm 98 bpm   Heart Rate (Exit) 86 bpm 85 bpm 90 bpm 90 bpm 87 bpm   Oxygen Saturation (Admit) 95 % 97 % 94 % 96 % 98 %   Oxygen Saturation (Exercise) 89 % 94 % 96 % 97 % 97 %   Oxygen Saturation (Exit) 94 % 95 % 95 % 97 % 99 %   Rating of Perceived Exertion (Exercise) 13 13 14 12 13    Perceived Dyspnea (Exercise) 2 3 1 2 1    Symptoms SOB SOB -- knee pain --   Comments walk test results -- -- -- --   Duration -- Continue with 30 min of aerobic exercise without signs/symptoms of physical distress. Continue with 30 min of aerobic exercise  without signs/symptoms of physical distress. Continue with 30 min of aerobic exercise without signs/symptoms of physical distress. Continue with 30 min of aerobic exercise without signs/symptoms of physical distress.   Intensity -- THRR unchanged THRR unchanged THRR unchanged THRR unchanged     Progression   Progression -- Continue to progress workloads to maintain intensity without signs/symptoms of physical distress. Continue to progress workloads to maintain intensity without signs/symptoms of physical distress. Continue to progress workloads to maintain intensity without signs/symptoms of physical distress. Continue to progress workloads to maintain intensity without signs/symptoms of physical distress.   Average METs -- 2.38 2.3 2.66 2.55     Resistance Training   Training Prescription -- Yes Yes Yes Yes   Weight -- 3 lb  3lb  3lb 4 lb   Reps -- 10-15 10-15 10-15 10-15     Interval Training   Interval Training -- No No No No     Treadmill   MPH -- 2.2 -- 1.9 --   Grade -- 0.5 -- 1 --   Minutes -- 15 -- 15 --   METs -- 2.84 -- 2.72 --     REL-XR   Level -- 1 3 -- --   Speed -- -- 50 -- --   Minutes -- 15 15 -- --   METs -- 2 -- -- --     T5 Nustep   Level -- 5 3 5 5    SPM -- -- 80 -- --  Minutes -- $[id number]'15 15 30 '[id number]$ --   METs -- 2.3 2.3 2.6 3.1     Biostep-RELP   Level -- -- -- -- 4   SPM -- -- -- -- 50   Minutes -- -- -- -- 15   METs -- -- -- -- 2     Home Exercise Plan   Plans to continue exercise at -- Home (comment)  walking, gym -- Home (comment)  walking, gym Home (comment)  walking, gym   Frequency -- Add 2 additional days to program exercise sessions. -- Add 2 additional days to program exercise sessions. Add 2 additional days to program exercise sessions.   Initial Home Exercises Provided -- 05/13/20 -- 05/13/20 05/13/20   Row Name 10/16/20 1300             Response to Exercise   Blood Pressure (Admit) 126/72       Blood Pressure (Exercise) 198/84       Blood  Pressure (Exit) 104/68       Heart Rate (Admit) 76 bpm       Heart Rate (Exercise) 100 bpm       Heart Rate (Exit) 92 bpm       Oxygen Saturation (Admit) 94 %       Oxygen Saturation (Exercise) 95 %       Oxygen Saturation (Exit) 95 %       Rating of Perceived Exertion (Exercise) 12       Perceived Dyspnea (Exercise) 3       Comments first day back       Duration Continue with 30 min of aerobic exercise without signs/symptoms of physical distress.               Progression   Progression Continue to progress workloads to maintain intensity without signs/symptoms of physical distress.       Average METs 3.43               Resistance Training   Training Prescription Yes       Weight 4 lb       Reps 10-15               Interval Training   Interval Training No               Treadmill   MPH 2.5       Grade 1       Minutes 15       METs 3.26               T5 Nustep   Level 5       METs 3.43              Exercise Comments:  Exercise Comments    Row Name 05/22/20 0747 06/17/20 0719         Exercise Comments Sharon Cervantes saw the doctor yesterday and they started her on prednisone for her knee.  She is also supposed to stay off the treadmill for 3 weeks as well.  We also talked about making sure she continues to exercise to stay out of her depression. Sharon Cervantes called and said she will not be here for the next two weeks.  She also left message that her physician has started her with PT for her knee.             Exercise Goals and Review:  Exercise Goals    Row Name 04/29/20 [phone]  Exercise Goals   Increase Physical Activity Yes       Intervention Provide advice, education, support and counseling about physical activity/exercise needs.;Develop an individualized exercise prescription for aerobic and resistive training based on initial evaluation findings, risk stratification, comorbidities and participant's personal goals.       Expected Outcomes Short Term:  Attend rehab on a regular basis to increase amount of physical activity.;Long Term: Add in home exercise to make exercise part of routine and to increase amount of physical activity.;Long Term: Exercising regularly at least 3-5 days a week.       Increase Strength and Stamina Yes       Intervention Provide advice, education, support and counseling about physical activity/exercise needs.;Develop an individualized exercise prescription for aerobic and resistive training based on initial evaluation findings, risk stratification, comorbidities and participant's personal goals.       Expected Outcomes Short Term: Increase workloads from initial exercise prescription for resistance, speed, and METs.;Short Term: Perform resistance training exercises routinely during rehab and add in resistance training at home;Long Term: Improve cardiorespiratory fitness, muscular endurance and strength as measured by increased METs and functional capacity (6MWT)       Able to understand and use rate of perceived exertion (RPE) scale Yes       Intervention Provide education and explanation on how to use RPE scale       Expected Outcomes Short Term: Able to use RPE daily in rehab to express subjective intensity level;Long Term:  Able to use RPE to guide intensity level when exercising independently       Able to understand and use Dyspnea scale Yes       Intervention Provide education and explanation on how to use Dyspnea scale       Expected Outcomes Short Term: Able to use Dyspnea scale daily in rehab to express subjective sense of shortness of breath during exertion;Long Term: Able to use Dyspnea scale to guide intensity level when exercising independently       Knowledge and understanding of Target Heart Rate Range (THRR) Yes       Intervention Provide education and explanation of THRR including how the numbers were predicted and where they are located for reference       Expected Outcomes Short Term: Able to state/look up  THRR;Long Term: Able to use THRR to govern intensity when exercising independently;Short Term: Able to use daily as guideline for intensity in rehab       Able to check pulse independently Yes       Intervention Provide education and demonstration on how to check pulse in carotid and radial arteries.;Review the importance of being able to check your own pulse for safety during independent exercise       Expected Outcomes Short Term: Able to explain why pulse checking is important during independent exercise;Long Term: Able to check pulse independently and accurately       Understanding of Exercise Prescription Yes       Intervention Provide education, explanation, and written materials on patient's individual exercise prescription       Expected Outcomes Short Term: Able to explain program exercise prescription;Long Term: Able to explain home exercise prescription to exercise independently              Exercise Goals Re-Evaluation :  Exercise Goals Re-Evaluation    Row Name 05/01/20 0755 05/13/20 0805 05/26/20 1804 06/10/20 1632 07/01/20 0809     Exercise Goal Re-Evaluation   Exercise Goals Review  Increase Physical Activity;Able to understand and use rate of perceived exertion (RPE) scale;Knowledge and understanding of Target Heart Rate Range (THRR);Understanding of Exercise Prescription;Able to check pulse independently;Increase Strength and Stamina Increase Physical Activity;Able to understand and use rate of perceived exertion (RPE) scale;Knowledge and understanding of Target Heart Rate Range (THRR);Understanding of Exercise Prescription;Able to check pulse independently;Increase Strength and Stamina;Able to understand and use Dyspnea scale Increase Physical Activity;Able to understand and use rate of perceived exertion (RPE) scale;Knowledge and understanding of Target Heart Rate Range (THRR);Understanding of Exercise Prescription;Able to check pulse independently;Increase Strength and  Stamina;Able to understand and use Dyspnea scale -- Increase Physical Activity;Able to understand and use rate of perceived exertion (RPE) scale;Knowledge and understanding of Target Heart Rate Range (THRR);Understanding of Exercise Prescription;Able to check pulse independently;Increase Strength and Stamina;Able to understand and use Dyspnea scale   Comments Reviewed RPE and dyspnea scales, THR and program prescription with pt today.  Pt voiced understanding and was given a copy of goals to take home. Reviewed home exercise with pt today.  Pt plans to walk at home for exercise.  Reviewed THR, pulse, RPE, sign and symptoms, pulse oximetery and when to call 911 or MD.  Also discussed weather considerations and indoor options.  Pt voiced understanding.  Sharon Cervantes is going to get back to her exercise routine. Sharon Cervantes has a bruised meniscus and her Dr recommended her not do the TM for now.  Staff will monitor progress. out since last review Sharon Cervantes is back for the first time since her last review. She has not been exercising at home due to knee problems - starting PT on Friday. MD says she can walk on a flat surface and seated machines that don't irritate her knee (XR hurt her knee). Will monitor to see how she progresses and re-evaluate next goal review.   Expected Outcomes Short: Use RPE daily to regulate intensity. Long: Follow program prescription in THR. Short: Get back to exericse routine.  Long: Continue to improve stamina. Short:  maintain consistent exercise Long: get back to walking -- ST: attend rehab and PT LT: exercise 150 minutes of moderate or 70 minutes of vigorous exercise per week with at least 2 days of weight training.   Sharon Cervantes Name 07/08/20 0093 07/09/20 0859 08/06/20 0827 09/17/20 1357 09/29/20 1529     Exercise Goal Re-Evaluation   Exercise Goals Review Increase Physical Activity;Increase Strength and Stamina Increase Physical Activity;Increase Strength and Stamina;Understanding of Exercise  Prescription -- Increase Physical Activity;Increase Strength and Stamina --   Comments Sharon Cervantes is doing PT on M/W in addition to her LW sessions.  Today she is doing the T5 and walking track. [place] is doing ewll in rehab. She is doing fine with T5 NuStep and her knee is feeling better. We will continue to monitor her progress. Out with knee since last review. Sharon Cervantes is just getting back into exercise after being for several weeks.  We will monitor progress. Out with COVID since last review   Expected Outcomes Short: continue PT and LW Long: increase stamina Short: Continue to use PT for knee Long; Continue to improve stamina. -- Short: get back to regular attendance Long: improve overall stamina --          Discharge Exercise Prescription (Final Exercise Prescription Changes):  Exercise Prescription Changes - 10/16/20 1300      Response to Exercise   Blood Pressure (Admit) 126/72    Blood Pressure (Exercise) 198/84    Blood Pressure (Exit) 104/68  Heart Rate (Admit) 76 bpm    Heart Rate (Exercise) 100 bpm    Heart Rate (Exit) 92 bpm    Oxygen Saturation (Admit) 94 %    Oxygen Saturation (Exercise) 95 %    Oxygen Saturation (Exit) 95 %    Rating of Perceived Exertion (Exercise) 12    Perceived Dyspnea (Exercise) 3    Comments first day back    Duration Continue with 30 min of aerobic exercise without signs/symptoms of physical distress.      Progression   Progression Continue to progress workloads to maintain intensity without signs/symptoms of physical distress.    Average METs 3.43      Resistance Training   Training Prescription Yes    Weight 4 lb    Reps 10-15      Interval Training   Interval Training No      Treadmill   MPH 2.5    Grade 1    Minutes 15    METs 3.26      T5 Nustep   Level 5    METs 3.43           Nutrition:  Target Goals: Understanding of nutrition guidelines, daily intake of sodium '1500mg'$ , cholesterol '200mg'$ , calories 30% from fat and 7%  or less from saturated fats, daily to have 5 or more servings of fruits and vegetables.  Education: All About Nutrition: -Group instruction provided by verbal, written material, interactive activities, discussions, models, and posters to present general guidelines for heart healthy nutrition including fat, fiber, MyPlate, the role of sodium in heart healthy nutrition, utilization of the nutrition label, and utilization of this knowledge for meal planning. Follow up email sent as well. Written material given at graduation. Flowsheet Row Pulmonary Rehab from 09/04/2020 in [facility] Pul[facility]  Date 07/10/20  Educator [facility]  Instruction Review Code 1- Verbalizes Understanding      Biometrics:  Pre Biometrics - 04/29/20 0909      Pre Biometrics   Height 5' 5.7" (1.669 m)    Weight 251 lb 1.6 oz (113.9 kg)    BMI (Calculated) 40.89    Single Leg Stand 6.47 seconds            Nutrition Therapy Plan and Nutrition Goals:  Nutrition Therapy & Goals - 07/08/20 0849      Nutrition Therapy   Diet heart healthy, low Na    Protein (specify units) 90g    Fiber 25 grams    Whole Grain Foods 3 servings    Saturated Fats 12 max. grams    Fruits and Vegetables 8 servings/day    Sodium 1.5 grams      Personal Nutrition Goals   Nutrition Goal ST; include 2 additional vegetables or fruit in snacks or breakfast, try 3 new vegetables for dinner meals - interested in honey-nut squash LT: include 8 fruits or vegetables every day (or most days), include a variety (all the colors most weeks)    Comments B: Bagel (cream cheese - sometimes strawberry low-fat) or cinnamon and raisin (peanut butter - no sugar or salt) or scrambled egg (2 eggs) - whole wheat bread dry.  S: pack of peanut butter crackers. L: ham sandwich or pimento cheese sandwich and chips on whole wheat.  S: another pack of peanut butter crackers D: sometimes eat out. At home chicken and green beans (airfrier and frozen vegetables) or  another bagel. Doesn't use fat to cook - maybe some oil and garlic, salt and pepper  on green beans. No longer in weight watchers. Vegetables: green beans, corn, potatoes, peas, black beans, butter beans. Drink: tea - unsweet. water with crystal light. Coffee x2 - stevia and coffee mate (creamer). Reviewed heart healthy eating and the importance of getting enough vegetables and a variety as well as fat with meals to absorb fat-soluble vitamins.      Intervention Plan   Intervention Prescribe, educate and counsel regarding individualized specific dietary modifications aiming towards targeted core components such as weight, hypertension, lipid management, diabetes, heart failure and other comorbidities.;Nutrition handout(s) given to patient.    Expected Outcomes Short Term Goal: Understand basic principles of dietary content, such as calories, fat, sodium, cholesterol and nutrients.;Short Term Goal: A plan has been developed with personal nutrition goals set during dietitian appointment.;Long Term Goal: Adherence to prescribed nutrition plan.           Nutrition Assessments:  Nutrition Assessments - 04/29/20 0851      MEDFICTS Scores   Pre Score 42          MEDIFICTS Score Key:  ?70 Need to make dietary changes   40-70 Heart Healthy Diet  ? 40 Therapeutic Level Cholesterol Diet   Picture Your Plate Scores:  <35 Unhealthy dietary pattern with much room for improvement.  41-50 Dietary pattern unlikely to meet recommendations for good health and room for improvement.  51-60 More healthful dietary pattern, with some room for improvement.   >60 Healthy dietary pattern, although there may be some specific behaviors that could be improved.   Nutrition Goals Re-Evaluation:  Nutrition Goals Re-Evaluation    [place] Name 10/16/20 [place] is using an air fryer, cutting down on bread and sweets.  She is getting back into Weight Watchers.  She has cottage  cheese with no sugar fruit cocktail for snacks.       Expected Outcome Short: continue current habits and add more vegetables Long:  get a variety of heart healthy foods in meal plan              Nutrition Goals Discharge (Final Nutrition Goals Re-Evaluation):  Nutrition Goals Re-Evaluation - 10/16/20 [place] is using an air fryer, cutting down on bread and sweets.  She is getting back into Weight Watchers.  She has cottage cheese with no sugar fruit cocktail for snacks.    Expected Outcome Short: continue current habits and add more vegetables Long:  get a variety of heart healthy foods in meal plan           Psychosocial: Target Goals: Acknowledge presence or absence of significant depression and/or stress, maximize coping skills, provide positive support system. Participant is able to verbalize types and ability to use techniques and skills needed for reducing stress and depression.   Education: Stress, Anxiety, and Depression - Group verbal and visual presentation to define topics covered.  Reviews how body is impacted by stress, anxiety, and depression.  Also discusses healthy ways to reduce stress and to treat/manage anxiety and depression.  Written material given at graduation.   Education: Sleep Hygiene -Provides group verbal and written instruction about how sleep can affect your health.  Define sleep hygiene, discuss sleep cycles and impact of sleep habits. Review good sleep hygiene tips.    Initial Review & Psychosocial Screening:  Initial Psych Review & Screening - 04/29/20 7017  Initial Review   Current issues with Current Stress Concerns;History of Depression;Current Depression;Current Sleep Concerns    Source of Stress Concerns Chronic Illness;Financial    Comments PHQ is 5, no feeling of depression currently, not sleeping great 4-5 hours a night (going to bed late and up early), finances have gotten better      Sharon Cervantes? Yes   Sharon Cervantes lives near by, [place] friend April (moving this weekend)   Concerns Inappropriate over/under dependence on family/friends      Barriers   Psychosocial barriers to participate in program The patient should benefit from training in stress management and relaxation.;Psychosocial barriers identified (see note)      Screening Interventions   Interventions Provide feedback about the scores to participant;To provide support and resources with identified psychosocial needs;Encouraged to exercise    Expected Outcomes Short Term goal: Utilizing psychosocial counselor, staff and physician to assist with identification of specific Stressors or current issues interfering with healing process. Setting desired goal for each stressor or current issue identified.;Long Term Goal: Stressors or current issues are controlled or eliminated.;Short Term goal: Identification and review with participant of any Quality of Life or Depression concerns found by scoring the questionnaire.;Long Term goal: The participant improves quality of Life and PHQ9 Scores as seen by post scores and/or verbalization of changes           Quality of Life Scores:  Scores of 19 and below usually indicate a poorer quality of life in these areas.  A difference of  2-3 points is a clinically meaningful difference.  A difference of 2-3 points in the total score of the Quality of Life Index has been associated with significant improvement in overall quality of life, self-image, physical symptoms, and general health in studies assessing change in quality of life.  PHQ-9: Recent Review Flowsheet Data    Depression screen [facility] 2/9 09/02/2020 05/13/2020 04/29/2020 12/24/2019 12/10/2019   Decreased Interest 1 0 0 0 2   Down, Depressed, Hopeless 1 0 0 0 3   PHQ - 2 Score 2 0 0 0 5   Altered sleeping 3 2 3 2 2    Tired, decreased energy 3 1 1 1 2    Change in appetite 2 1 0 0 0   Feeling bad or failure about yourself  1 0 0 0  1   Trouble concentrating 1 1 1 1 1    Moving slowly or fidgety/restless 0 0 0 0 0   Suicidal thoughts 0 0 0 0 0   PHQ-9 Score 12 5 5 4 11    Difficult doing work/chores Somewhat difficult Somewhat difficult Somewhat difficult Not difficult at all Somewhat difficult     Interpretation of Total Score  Total Score Depression Severity:  1-4 = Minimal depression, 5-9 = Mild depression, 10-14 = Moderate depression, 15-19 = Moderately severe depression, 20-27 = Severe depression   Psychosocial Evaluation and Intervention:  Psychosocial Evaluation - 04/29/20 0800      Psychosocial Evaluation & Interventions   Interventions Stress management education;Encouraged to exercise with the program and follow exercise prescription    Comments Sharon Cervantes is coming back to pulmonary rehab with her asthma that has gotten worse since surgery.  She wants to focus on getting on her nebulizer and get back to walking longer.  She has a few new friends, but one is moving away this weekend.  She wants to get her weight back down too. Her finances are doing better.  Sleep continues to struggle for her.    Expected Outcomes Short: Attend rehab to improve breathing and endurance  Long: Continue to focus on positive and use mental boost    Continue Psychosocial Services  Follow up required by staff           Psychosocial Re-Evaluation:  Psychosocial Re-Evaluation    Row Name 05/13/20 0757 07/01/20 0814 07/08/20 0822 10/16/20 0929       Psychosocial Re-Evaluation   Current issues with Current Stress Concerns;Current Sleep Concerns History of Depression;Current Psychotropic Meds;Current Depression;Current Sleep Concerns -- --    Comments Sharon Cervantes is doing well in rehab.  She is sleeping pretty good, but still is staying up late.  She is trying to get better.  Her cat is doing better to with sleep.  She is going to get started on her home exercise again.  Her PHQ is the same, but shifting around some. She is on Effexor to  manage depression. She reports no symptoms of depression in last couple of weeks and does not go to talk therapy. She reports that she can't find anyone for talk therapy. Discussed some options to look for therapists including [facility] since she is not having luck finding anyone in person who is taking new patients. She will read, crochet to reduce stress. She reports getting 4-5 hours of sleep each night - sleep maintenance insomia - tried melatonin, but didn't see any difference. Discussed sleep hygiene. Addylynn says she has good and bad days as far as depression.  She still is on Effexor.  She has spoken to her Dr about sleep and has a prescirption for trazadone but has only tried it once.  We discussed trying some realxing music if she wakes up to help her get back to sleep. Adalea has slept well the past 2 nights.  She has started her CPAP 3 weeks ago.  She is stil on Effexor.  She has felt more depressed in past couple weeks.  She takes medication and uses positive self talk to feel better.    Expected Outcomes Short: Get back to exercise routine for mood boost  Long: Continue to focus on positive. ST: Find therapist, try sleep hygiene techniques. LT: continue to manage depression, sleep throughout the night. Short: try music to help with sleep long: develop better sleep patterns Short: continue to work on using CPAP Long: develop good sleep patterns    Interventions Encouraged to attend Pulmonary Rehabilitation for the exercise Encouraged to attend Pulmonary Rehabilitation for the exercise -- Encouraged to attend Pulmonary Rehabilitation for the exercise    Continue Psychosocial Services  Follow up required by staff Follow up required by staff -- Follow up required by staff    Comments -- She reports no current stress concerns, she is still working to manage her depression and sleep. -- --           Psychosocial Discharge (Final Psychosocial Re-Evaluation):  Psychosocial Re-Evaluation - 10/16/20  0929      Psychosocial Re-Evaluation   Comments Iyona has slept well the past 2 nights.  She has started her CPAP 3 weeks ago.  She is stil on Effexor.  She has felt more depressed in past couple weeks.  She takes medication and uses positive self talk to feel better.    Expected Outcomes Short: continue to work on using CPAP Long: develop good sleep patterns    Interventions Encouraged to attend Pulmonary Rehabilitation for the exercise    Continue Psychosocial Services  Follow up required by staff           Education: Education Goals: Education classes will be provided on a weekly basis, covering required topics. Participant will state understanding/return demonstration of topics presented.  Learning Barriers/Preferences:  Learning Barriers/Preferences - 04/29/20 0803      Learning Barriers/Preferences   Learning Barriers None    Learning Preferences None           General Pulmonary Education Topics:  Infection Prevention: - Provides verbal and written material to individual with discussion of infection control including proper hand washing and proper equipment cleaning during exercise session. Flowsheet Row Pulmonary Rehab from 09/04/2020 in [facility] Cardiac and Pulmonary Rehab  Date 04/29/20  Educator [facility]  Instruction Review Code 1- Verbalizes Understanding      Falls Prevention: - Provides verbal and written material to individual with discussion of falls prevention and safety. Flowsheet Row Pulmonary Rehab from 09/04/2020 in [facility] Cardiac and Pulmonary Rehab  Date 04/29/20  Educator [facility]  Instruction Review Code 1- Verbalizes Understanding      Chronic Lung Disease Review: - Group verbal instruction with posters, models, PowerPoint presentations and videos,  to review new updates, new respiratory medications, new advancements in procedures and treatments. Providing information on websites and "800" numbers for continued self-education. Includes information about supplement  oxygen, available portable oxygen systems, continuous and intermittent flow rates, oxygen safety, concentrators, and Medicare reimbursement for oxygen. Explanation of Pulmonary Drugs, including class, frequency, complications, importance of spacers, rinsing mouth after steroid MDI's, and proper cleaning methods for nebulizers. Review of basic lung anatomy and physiology related to function, structure, and complications of lung disease. Review of risk factors. Discussion about methods for diagnosing sleep apnea and types of masks and machines for OSA. Includes a review of the use of types of environmental controls: home humidity, furnaces, filters, dust mite/pet prevention, HEPA vacuums. Discussion about weather changes, air quality and the benefits of nasal washing. Instruction on Warning signs, infection symptoms, calling MD promptly, preventive modes, and value of vaccinations. Review of effective airway clearance, coughing and/or vibration techniques. Emphasizing that all should Create an Action Plan. Written material given at graduation. Flowsheet Row Pulmonary Rehab from 09/04/2020 in [facility] Cardiac and Pulmonary Rehab  Date 04/29/20  Instruction Review Code 3- Needs Reinforcement  [need identified]      AED/CPR: - Group verbal and written instruction with the use of models to demonstrate the basic use of the AED with the basic ABC's of resuscitation.    Anatomy and Cardiac Procedures: - Group verbal and visual presentation and models provide information about basic cardiac anatomy and function. Reviews the testing methods done to diagnose heart disease and the outcomes of the test results. Describes the treatment choices: Medical Management, Angioplasty, or Coronary Bypass Surgery for treating various heart conditions including Myocardial Infarction, Angina, Valve Disease, and Cardiac Arrhythmias.  Written material given at graduation.   Medication Safety: - Group verbal and visual instruction to  review commonly prescribed medications for heart and lung disease. Reviews the medication, class of the drug, and side effects. Includes the steps to properly store meds and maintain the prescription regimen.  Written material given at graduation.   Other: -Provides group and verbal instruction on various topics (see comments)   Knowledge Questionnaire Score:  Knowledge Questionnaire Score - 04/29/20 0851      Knowledge Questionnaire Score   Pre Score 16/18 Education Focus: O2 safety            Core  Components/Risk Factors/Patient Goals at Admission:  Personal Goals and Risk Factors at Admission - 04/29/20 0803      Core Components/Risk Factors/Patient Goals on Admission    Weight Management Yes;Obesity;Weight Loss    Intervention Weight Management: Develop a combined nutrition and exercise program designed to reach desired caloric intake, while maintaining appropriate intake of nutrient and fiber, sodium and fats, and appropriate energy expenditure required for the weight goal.;Weight Management/Obesity: Establish reasonable short term and long term weight goals.;Weight Management: Provide education and appropriate resources to help participant work on and attain dietary goals.;Obesity: Provide education and appropriate resources to help participant work on and attain dietary goals.    Admit Weight 251 lb 1.6 oz (113.9 kg)    Goal Weight: Short Term 245 lb (111.1 kg)    Goal Weight: Long Term 240 lb (108.9 kg)    Expected Outcomes Short Term: Continue to assess and modify interventions until short term weight is achieved;Long Term: Adherence to nutrition and physical activity/exercise program aimed toward attainment of established weight goal;Weight Loss: Understanding of general recommendations for a balanced deficit meal plan, which promotes 1-2 lb weight loss per week and includes a negative energy balance of [phone] kcal/d;Understanding recommendations for meals to include 15-35%  energy as protein, 25-35% energy from fat, 35-60% energy from carbohydrates, less than $RemoveB'200mg'wLDLMBAb$  of dietary cholesterol, 20-35 gm of total fiber daily;Understanding of distribution of calorie intake throughout the day with the consumption of 4-5 meals/snacks    Improve shortness of breath with ADL's Yes    Intervention Provide education, individualized exercise plan and daily activity instruction to help decrease symptoms of SOB with activities of daily living.    Expected Outcomes Short Term: Improve cardiorespiratory fitness to achieve a reduction of symptoms when performing ADLs;Long Term: Be able to perform more ADLs without symptoms or delay the onset of symptoms    Hypertension Yes    Intervention Provide education on lifestyle modifcations including regular physical activity/exercise, weight management, moderate sodium restriction and increased consumption of fresh fruit, vegetables, and low fat dairy, alcohol moderation, and smoking cessation.;Monitor prescription use compliance.    Expected Outcomes Long Term: Maintenance of blood pressure at goal levels.;Short Term: Continued assessment and intervention until BP is < 140/62mm HG in hypertensive participants. < 130/54mm HG in hypertensive participants with diabetes, heart failure or chronic Cervantes disease.    Lipids Yes    Intervention Provide education and support for participant on nutrition & aerobic/resistive exercise along with prescribed medications to achieve LDL '70mg'$ , HDL >$Remo'40mg'lEhzo$ .    Expected Outcomes Short Term: Participant states understanding of desired cholesterol values and is compliant with medications prescribed. Participant is following exercise prescription and nutrition guidelines.;Long Term: Cholesterol controlled with medications as prescribed, with individualized exercise RX and with personalized nutrition plan. Value goals: LDL < $Rem'70mg'NKEj$ , HDL > 40 mg.           Education:Diabetes - Individual verbal and written instruction to  review signs/symptoms of diabetes, desired ranges of glucose level fasting, after meals and with exercise. Acknowledge that pre and post exercise glucose checks will be done for 3 sessions at entry of program.   Know Your Numbers and Heart Failure: - Group verbal and visual instruction to discuss disease risk factors for cardiac and pulmonary disease and treatment options.  Reviews associated critical values for Overweight/Obesity, Hypertension, Cholesterol, and Diabetes.  Discusses basics of heart failure: signs/symptoms and treatments.  Introduces Heart Failure Zone chart for action plan for heart failure.  Written material  given at graduation.   Core Components/Risk Factors/Patient Goals Review:   Goals and Risk Factor Review    Row Name 05/13/20 0800 07/01/20 0826 07/08/20 0809 10/16/20 4431       Core Components/Risk Factors/Patient Goals Review   Personal Goals Review Weight Management/Obesity;Improve shortness of breath with ADL's;Hypertension;Lipids Weight Management/Obesity;Improve shortness of breath with ADL's;Hypertension Weight Management/Obesity;Improve shortness of breath with ADL's;Hypertension Improve shortness of breath with ADL's;Hypertension    Review Talya is off to a good start in rehab.  Her weight was up today as she was eating over the weekend.  She is determined to get back to exercise to help curb her eating.  Blood pressures have been doing good in class but she is not checking at home since they have been doing better.  Her breathing is still a bit of a stuggle recently. She is trying to get her neublizer im She reports her weight has been stable - has not met with RD regarding nutrition yet. She reports SOB is good unless she has a coughing spell - hasn't had to use her inhaler in a week or two. She only takes nebulizer in morning because it makes her shake. She reports her BP has been good at rehab, she doesn't have a BP cuff , but will buy one. SOB has been ok lately  except when she has a coughing spell.  She is doing PT for knee and was advised not to do TM for now, but can walk on track.  She is taking meds as directed.  She is going to order a BP cuff ffrom [place] today. Today is Sharon Cervantes's first day back after missing a couple weeks.  SOB has been bad since she had Covid.  She just finished prednisone.  Knee is better and Sharon Cervantes can use TM.  She still has trouble going up steps.  BP was 126/72 today and has been good when she went to Dr.    Noberto Cervantes Outcomes Short: Get back to nebulizer  Long: Work on weight loss. ST: Get BP cuff, continue to take nebulizer. LT: meet with RD Short: order BP cuff, meet with RD today Long: monitor BP at home, follow RD recommendations Short: get back to regular attendance  Long:  build stamina back           Core Components/Risk Factors/Patient Goals at Discharge (Final Review):   Goals and Risk Factor Review - 10/16/20 0922      Core Components/Risk Factors/Patient Goals Review   Personal Goals Review Improve shortness of breath with ADL's;Hypertension    Review Today is Rilyn's first day back after missing a couple weeks.  SOB has been bad since she had Covid.  She just finished prednisone.  Knee is better and Sharon Cervantes can use TM.  She still has trouble going up steps.  BP was 126/72 today and has been good when she went to Dr.    Noberto Cervantes Outcomes Short: get back to regular attendance  Long:  build stamina back           ITP Comments:  ITP Comments    Row Name 04/29/20 0805 05/01/20 0755 05/06/20 0717 05/07/20 1548 05/22/20 0746   ITP Comments Completed 6MWT and gym orientation. Initial ITP created and sent for review to Dr. Emily Filbert, Medical Director. Documentaiton for diagnosis can be found in Care Everywhere encounter 04/10/2020. First full day of exercise!  Patient was oriented to gym and equipment including functions, settings, policies, and procedures.  Patient's individual  exercise prescription and treatment plan  were reviewed.  All starting workloads were established based on the results of the 6 minute walk test done at initial orientation visit.  The plan for exercise progression was also introduced and progression will be customized based on patient's performance and goals pt fell and hurt knee at home yesterday 30 day review completed. ITP sent to Dr. Emily Filbert, Medical Director of Cardiac and Pulmonary Rehab. Continue with ITP unless changes are made by physician. [employer] saw the doctor yesterday and they started her on prednisone for her knee.  She is also supposed to stay off the treadmill for 3 weeks as well.   [place] Name 06/04/20 0548 06/10/20 1631 06/17/20 0719 06/25/20 1453 07/02/20 0640   ITP Comments 30 Day review completed. Medical Director ITP review done, changes made as directed, and signed approval by Medical Director. Neriyah has called out the past couple of weeks with knee pain.  She is supposed to see her doctor this week to see what is going on.  She will be out of town on Thursday. Cody called and said she will not be here for the next two weeks.  She also left message that her physician has started her with PT for her knee. Pt has not attended since last review. 30 Day review completed. Medical Director ITP review done, changes made as directed, and signed approval by Medical Director.   [place] Name 07/22/20 1304 07/22/20 1531 07/30/20 0940 08/06/20 0827 08/27/20 0555   ITP Comments Pt has not attended since last review. Maylen will have surgery for a hernia tomorrow.  She will let staff know when she is cleared to return to exercise. 30 Day review completed. Medical Director ITP review done, changes made as directed, and signed approval by Medical Director. Called to check on patient.  She saw surgeon yesterday and still having fluid built up around knee.  They want her to stay off her knee for about 4 weeks. They may have to drain it if it doesn't improve.  We will cancel appointments through the  end of the year and she hopes to be able to return in January. 30 Day review completed. Medical Director ITP review done, changes made as directed, and signed approval by Medical Director.  Remains out with knee problems   Row Name 09/18/20 1232 09/23/20 1212 09/24/20 0918 10/22/20 0642     ITP Comments Amoni called 09/17/20 to let us know that she had tested positive for COVID.  She will be out until 09/30/20 Jonia has only been to rehab a couple of times since last review - she is now out with COVID - unable to get goals this round. 30 Day review completed. Medical Director ITP review done, changes made as directed, and signed approval by Medical Director. 30 Day review completed. Medical Director ITP review done, changes made as directed, and signed approval by Medical Director.           Comments:

## 2020-10-28 ENCOUNTER — Encounter: Payer: Self-pay | Admitting: *Deleted

## 2020-10-28 DIAGNOSIS — J4550 Severe persistent asthma, uncomplicated: Secondary | ICD-10-CM

## 2020-10-30 ENCOUNTER — Other Ambulatory Visit: Payer: Self-pay

## 2020-10-30 ENCOUNTER — Encounter: Payer: Medicare Other | Attending: Pulmonary Disease

## 2020-10-30 DIAGNOSIS — J4550 Severe persistent asthma, uncomplicated: Secondary | ICD-10-CM | POA: Insufficient documentation

## 2020-10-30 DIAGNOSIS — I272 Pulmonary hypertension, unspecified: Secondary | ICD-10-CM

## 2020-10-30 NOTE — Progress Notes (Signed)
Daily Session Note  Patient Details  Name: Sharon Cervantes MRN: [id number] Date of Birth: 22-Nov-1950 Referring Provider:   Flowsheet Row Pulmonary Rehab from 04/29/2020 in [facility]  Referring Provider Claudette Stapler MD      Encounter Date: 10/30/2020  Check In:  Session Check In - 10/30/20 0940      Check-In   Supervising physician immediately available to respond to emergencies See telemetry face sheet for immediately available ER MD    Location [facility]-Cardiac & Pulmonary Rehab    Staff Present Birdie Sons, MPA, RN;Melissa Caiola RDN, LDN;Jessica Luan Pulling, MA, RCEP, CCRP, CCET    Virtual Visit No    Medication changes reported     Yes    Comments no longer taking celexa, now taking wellbutrin    Fall or balance concerns reported    No    Tobacco Cessation No Change    Current number of cigarettes/nicotine per day     0    Warm-up and Cool-down Performed on first and last piece of equipment    Resistance Training Performed Yes    VAD Patient? No    PAD/SET Patient? No      Pain Assessment   Currently in Pain? No/denies              Social History   Tobacco Use  Smoking Status Never Smoker  Smokeless Tobacco Never Used    Goals Met:  Independence with exercise equipment Exercise tolerated well Personal goals reviewed No report of cardiac concerns or symptoms Strength training completed today  Goals Unmet:  Not Applicable  Comments: Pt able to follow exercise prescription today without complaint.  Will continue to monitor for progression.    Dr. Emily Filbert is Medical Director for [place] and [name].

## 2020-11-11 ENCOUNTER — Telehealth: Payer: Self-pay

## 2020-11-11 NOTE — Telephone Encounter (Signed)
VM full

## 2020-11-13 DIAGNOSIS — J4550 Severe persistent asthma, uncomplicated: Secondary | ICD-10-CM

## 2020-11-19 ENCOUNTER — Encounter: Payer: Self-pay | Admitting: *Deleted

## 2020-11-19 DIAGNOSIS — J4550 Severe persistent asthma, uncomplicated: Secondary | ICD-10-CM

## 2020-11-19 NOTE — Progress Notes (Signed)
Pulmonary Individual Treatment Plan  Patient Details  Name: Sharon Cervantes MRN: [id number] Date of Birth: 06/09/1951 Referring Provider:   Flowsheet Row Pulmonary Rehab from 04/29/2020 in [facility]  Referring Provider Aleskerov, Fred MD      Initial Encounter Date:  Flowsheet Row Pulmonary Rehab from 04/29/2020 in [facility]  Date 04/29/20      Visit Diagnosis: Severe persistent asthma, unspecified whether complicated  Patient's Home Medications on Admission:  Current Outpatient Medications:  .  albuterol (PROVENTIL HFA;VENTOLIN HFA) 108 (90 Base) MCG/ACT inhaler, Inhale 2 puffs into the lungs every 6 (six) hours as needed for wheezing or shortness of breath. , Disp: , Rfl:  .  albuterol (PROVENTIL) (2.5 MG/3ML) 0.083% nebulizer solution, Take 2.5 mg by nebulization 2 (two) times daily as needed for wheezing or shortness of breath., Disp: , Rfl:  .  budesonide (PULMICORT) 0.5 MG/2ML nebulizer solution, Take 0.5 mg by nebulization 2 (two) times daily as needed (shortness of breath or wheezing). , Disp: , Rfl:  .  cyanocobalamin (,VITAMIN B-12,) 1000 MCG/ML injection, Inject 1,000 mcg into the muscle every 30 (thirty) days., Disp: , Rfl:  .  diclofenac (VOLTAREN) 50 MG EC tablet, Take 50 mg by mouth 2 (two) times daily., Disp: , Rfl:  .  diclofenac Sodium (VOLTAREN) 1 % GEL, Apply 1 application topically 4 (four) times daily as needed (pain)., Disp: , Rfl:  .  esomeprazole (NEXIUM) 40 MG capsule, Take 40 mg by mouth daily. , Disp: , Rfl:  .  FLUoxetine (PROZAC) 10 MG tablet, Take 10 mg by mouth daily., Disp: , Rfl:  .  furosemide (LASIX) 20 MG tablet, Take 20 mg by mouth daily as needed for edema., Disp: , Rfl:  .  gabapentin (NEURONTIN) 300 MG capsule, Take 300 mg by mouth 3 (three) times daily. , Disp: , Rfl:  .  hydrochlorothiazide (HYDRODIURIL) 12.5 MG tablet, Take 12.5 mg by mouth daily. , Disp: , Rfl:  .  HYDROcodone-acetaminophen (NORCO)  5-325 MG tablet, Take 1 tablet by mouth every 6 (six) hours as needed for up to 6 doses for moderate pain., Disp: 6 tablet, Rfl: 0 .  HYDROcodone-homatropine (HYCODAN) 5-1.5 MG/5ML syrup, Take 5 mLs by mouth every 6 (six) hours as needed for cough., Disp: , Rfl:  .  ibuprofen (ADVIL) 200 MG tablet, Take 800 mg by mouth every 6 (six) hours as needed for headache or moderate pain., Disp: , Rfl:  .  montelukast (SINGULAIR) 10 MG tablet, Take 1 tablet (10 mg total) by mouth at bedtime. appt further refills (Patient taking differently: Take 10 mg by mouth at bedtime. ), Disp: 90 tablet, Rfl: 1 .  oxymetazoline (AFRIN) 0.05 % nasal spray, Place 1 spray into both nostrils daily as needed (nose bleeds)., Disp: , Rfl:  .  pramipexole (MIRAPEX) 1.5 MG tablet, Take 1.5 mg by mouth at bedtime. , Disp: , Rfl:  .  traMADol (ULTRAM) 50 MG tablet, Take 50 mg by mouth every 8 (eight) hours as needed for moderate pain., Disp: , Rfl:  .  traZODone (DESYREL) 100 MG tablet, Take 100 mg by mouth at bedtime as needed for sleep., Disp: , Rfl:  .  venlafaxine XR (EFFEXOR-XR) 75 MG 24 hr capsule, Take 150 mg by mouth daily with breakfast. , Disp: , Rfl:  No current facility-administered medications for this visit.  Facility-Administered Medications Ordered in Other Visits:  .  [COMPLETED] sodium chloride 0.9 % nebulizer solution 3 mL, 3 mL,   Nebulization, Once, 3 mL at 10/03/18 1130 **FOLLOWED BY** [COMPLETED] methacholine (PROVOCHOLINE) inhaler solution 0.125 mg, 2 mL, Inhalation, Once, 0.125 mg at 10/03/18 1139 **FOLLOWED BY** methacholine (PROVOCHOLINE) inhaler solution 0.5 mg, 2 mL, Inhalation, Once **FOLLOWED BY** methacholine (PROVOCHOLINE) inhaler solution 2 mg, 2 mL, Inhalation, Once **FOLLOWED BY** methacholine (PROVOCHOLINE) inhaler solution 8 mg, 2 mL, Inhalation, Once **FOLLOWED BY** methacholine (PROVOCHOLINE) inhaler solution 32 mg, 2 mL, Inhalation, Once **FOLLOWED BY** [COMPLETED] albuterol (PROVENTIL) (2.5 MG/3ML)  0.083% nebulizer solution 2.5 mg, 2.5 mg, Nebulization, Once, Aleskerov, Fuad, MD, 2.5 mg at 10/03/18 1146  Past Medical History: Past Medical History:  Diagnosis Date  . Arthritis   . Asthma   . Chronic cough   . Colon polyps   . Depression   . GERD (gastroesophageal reflux disease)   . Hiatal hernia    LARGE  . History of chicken pox   . Hypertension   . Macular degeneration   . Multinodular goiter    FNA in past neg h/o thyroid cysts  . Pernicious anemia    PERNICIOUS   . RLS (restless legs syndrome)   . Sleep apnea    NO CPAP    Tobacco Use: Social History   Tobacco Use  Smoking Status Never Smoker  Smokeless Tobacco Never Used    Labs: Recent Review Flowsheet Data    Labs for ITP Cardiac and Pulmonary Rehab Latest Ref Rng & Units 10/12/2018   Hemoglobin A1c 4.6 - 6.5 % 5.6       Pulmonary Assessment Scores:   UCSD: Self-administered rating of dyspnea associated with activities of daily living (ADLs) 6-point scale (0 = "not at all" to 5 = "maximal or unable to do because of breathlessness")  Scoring Scores range from 0 to 120.  Minimally important difference is 5 units  CAT: CAT can identify the health impairment of COPD patients and is better correlated with disease progression.  CAT has a scoring range of zero to 40. The CAT score is classified into four groups of low (less than 10), medium (10 - 20), high (21-30) and very high (31-40) based on the impact level of disease on health status. A CAT score over 10 suggests significant symptoms.  A worsening CAT score could be explained by an exacerbation, poor medication adherence, poor inhaler technique, or progression of COPD or comorbid conditions.  CAT MCID is 2 points  mMRC: mMRC (Modified Medical Research Council) Dyspnea Scale is used to assess the degree of baseline functional disability in patients of respiratory disease due to dyspnea. No minimal important difference is established. A decrease in score  of 1 point or greater is considered a positive change.   Pulmonary Function Assessment:   Exercise Target Goals: Exercise Program Goal: Individual exercise prescription set using results from initial 6 min walk test and THRR while considering  patient's activity barriers and safety.   Exercise Prescription Goal: Initial exercise prescription builds to 30-45 minutes a day of aerobic activity, 2-3 days per week.  Home exercise guidelines will be given to patient during program as part of exercise prescription that the participant will acknowledge.  Education: Aerobic Exercise: - Group verbal and visual presentation on the components of exercise prescription. Introduces F.I.T.T principle from ACSM for exercise prescriptions.  Reviews F.I.T.T. principles of aerobic exercise including progression. Written material given at graduation.   Education: Resistance Exercise: - Group verbal and visual presentation on the components of exercise prescription. Introduces F.I.T.T principle from ACSM for exercise prescriptions  Reviews F.I.T.T.   principles of resistance exercise including progression. Written material given at graduation.    Education: Exercise & Equipment Safety: - Individual verbal instruction and demonstration of equipment use and safety with use of the equipment. Flowsheet Row Pulmonary Rehab from 09/04/2020 in [facility]  Date 04/29/20  Educator [facility]  Instruction Review Code 1- Verbalizes Understanding      Education: Exercise Physiology & General Exercise Guidelines: - Group verbal and written instruction with models to review the exercise physiology of the cardiovascular system and associated critical values. Provides general exercise guidelines with specific guidelines to those with heart or lung disease.    Education: Flexibility, Balance, Mind/Body Relaxation: - Group verbal and visual presentation with interactive activity on the components of exercise  prescription. Introduces F.I.T.T principle from ACSM for exercise prescriptions. Reviews F.I.T.T. principles of flexibility and balance exercise training including progression. Also discusses the mind body connection.  Reviews various relaxation techniques to help reduce and manage stress (i.e. Deep breathing, progressive muscle relaxation, and visualization). Balance handout provided to take home. Written material given at graduation. Flowsheet Row Pulmonary Rehab from 09/04/2020 in [facility] Pul[facility] Re[facility]  Date 09/04/20  Educator [facility]  Instruction Review Code 1- Verbalizes Understanding      Activity Barriers & Risk Stratification:   6 Minute Walk:  Oxygen Initial Assessment:   Oxygen Re-Evaluation:  Oxygen Re-Evaluation    [place] Name 07/01/20 0813 10/30/20 0928           Program Oxygen Prescription   Program Oxygen Prescription None None             Home Oxygen   Home Oxygen Device None None      Sleep Oxygen Prescription CPAP  She has not recieved it yet CPAP      Liters per minute -- 0      Home Exercise Oxygen Prescription None None      Home Resting Oxygen Prescription None None      Compliance with Home Oxygen Use -- Yes             Goals/Expected Outcomes   Short Term Goals To learn and understand importance of monitoring SPO2 with pulse oximeter and demonstrate accurate use of the pulse oximeter.;To learn and understand importance of maintaining oxygen saturations>88%;To learn and demonstrate proper pursed lip breathing techniques or other breathing techniques.;To learn and demonstrate proper use of respiratory medications To learn and understand importance of monitoring SPO2 with pulse oximeter and demonstrate accurate use of the pulse oximeter.;To learn and understand importance of maintaining oxygen saturations>88%;To learn and demonstrate proper pursed lip breathing techniques or other breathing techniques.;To learn and demonstrate proper use of respiratory  medications      Long  Term Goals Verbalizes importance of monitoring SPO2 with pulse oximeter and return demonstration;Maintenance of O2 saturations>88%;Exhibits proper breathing techniques, such as pursed lip breathing or other method taught during program session;Compliance with respiratory medication;Demonstrates proper use of MDI's Verbalizes importance of monitoring SPO2 with pulse oximeter and return demonstration;Maintenance of O2 saturations>88%;Exhibits proper breathing techniques, such as pursed lip breathing or other method taught during program session;Compliance with respiratory medication;Demonstrates proper use of MDI's      Comments Kasandra reports trying to remember to due pursed lip breathing, but it doesn't feel natural still. She takes her nebulizer in the morning, but not at night due to shaking. Ridley has gotten her CPAP.  She tries to use each night, but will usually wake with it off at some  point.  Her breathing has been rough the last couple of weeks.  She had to do a breathing treatment today prior to class and is a little shaky today.      Goals/Expected Outcomes ST: continue to practice PLB LT: use PLB during activity. ST: continue to practice PLB,, continued compliance with CPAP LT: use PLB during activity.             Oxygen Discharge (Final Oxygen Re-Evaluation):  Oxygen Re-Evaluation - 10/30/20 0928      Program Oxygen Prescription   Program Oxygen Prescription None      Home Oxygen   Home Oxygen Device None    Sleep Oxygen Prescription CPAP    Liters per minute 0    Home Exercise Oxygen Prescription None    Home Resting Oxygen Prescription None    Compliance with Home Oxygen Use Yes      Goals/Expected Outcomes   Short Term Goals To learn and understand importance of monitoring SPO2 with pulse oximeter and demonstrate accurate use of the pulse oximeter.;To learn and understand importance of maintaining oxygen saturations>88%;To learn and demonstrate proper  pursed lip breathing techniques or other breathing techniques.;To learn and demonstrate proper use of respiratory medications    Long  Term Goals Verbalizes importance of monitoring SPO2 with pulse oximeter and return demonstration;Maintenance of O2 saturations>88%;Exhibits proper breathing techniques, such as pursed lip breathing or other method taught during program session;Compliance with respiratory medication;Demonstrates proper use of MDI's    Comments Zorina has gotten her CPAP.  She tries to use each night, but will usually wake with it off at some point.  Her breathing has been rough the last couple of weeks.  She had to do a breathing treatment today prior to class and is a little shaky today.    Goals/Expected Outcomes ST: continue to practice PLB,, continued compliance with CPAP LT: use PLB during activity.           Initial Exercise Prescription:   Perform Capillary Blood Glucose checks as needed.  Exercise Prescription Changes:  Exercise Prescription Changes    Row Name 05/26/20 1800 07/09/20 0900 09/17/20 1300 10/16/20 1300       Response to Exercise   Blood Pressure (Admit) 122/66 116/70 112/76 126/72    Blood Pressure (Exercise) 132/78 120/66 126/64 198/84    Blood Pressure (Exit) 122/74 110/64 114/68 104/68    Heart Rate (Admit) 88 bpm 86 bpm 81 bpm 76 bpm    Heart Rate (Exercise) 97 bpm 108 bpm 98 bpm 100 bpm    Heart Rate (Exit) 90 bpm 90 bpm 87 bpm 92 bpm    Oxygen Saturation (Admit) 94 % 96 % 98 % 94 %    Oxygen Saturation (Exercise) 96 % 97 % 97 % 95 %    Oxygen Saturation (Exit) 95 % 97 % 99 % 95 %    Rating of Perceived Exertion (Exercise) 14 12 13 12    Perceived Dyspnea (Exercise) 1 2 1 3    Symptoms -- knee pain -- --    Comments -- -- -- first day back    Duration Continue with 30 min of aerobic exercise without signs/symptoms of physical distress. Continue with 30 min of aerobic exercise without signs/symptoms of physical distress. Continue with 30 min  of aerobic exercise without signs/symptoms of physical distress. Continue with 30 min of aerobic exercise without signs/symptoms of physical distress.    Intensity THRR unchanged THRR unchanged THRR unchanged --           Progression   Progression Continue to progress workloads to maintain intensity without signs/symptoms of physical distress. Continue to progress workloads to maintain intensity without signs/symptoms of physical distress. Continue to progress workloads to maintain intensity without signs/symptoms of physical distress. Continue to progress workloads to maintain intensity without signs/symptoms of physical distress.    Average METs 2.3 2.66 2.55 3.43         Resistance Training   Training Prescription Yes Yes Yes Yes    Weight  3lb  3lb 4 lb 4 lb    Reps 10-15 10-15 10-15 10-15         Interval Training   Interval Training No No No No         Treadmill   MPH -- 1.9 -- 2.5    Grade -- 1 -- 1    Minutes -- 15 -- 15    METs -- 2.72 -- 3.26         REL-XR   Level 3 -- -- --    Speed 50 -- -- --    Minutes 15 -- -- --         T5 Nustep   Level _0 SPM 80 -- -- --    Minutes 15 30 -- --    METs 2.3 2.6 3.1 3.43         Biostep-RELP   Level -- -- 4 --    SPM -- -- 50 --    Minutes -- -- 15 --    METs -- -- 2 --         Home Exercise Plan   Plans to continue exercise at -- Home (comment)  walking, gym Home (comment)  walking, gym --    Frequency -- Add 2 additional days to program exercise sessions. Add 2 additional days to program exercise sessions. --    Initial Home Exercises Provided -- 05/13/20 05/13/20 --           Exercise Comments:  Exercise Comments    Row Name 06/17/20 0719           Exercise Comments Laine called and said she will not be here for the next two weeks.  She also left message that her physician has started her with PT for her knee.              Exercise Goals and Review:   Exercise Goals Re-Evaluation :   Exercise Goals Re-Evaluation    Row Name 05/26/20 1804 06/10/20 1632 07/01/20 0809 07/08/20 0811 07/09/20 0859     Exercise Goal Re-Evaluation   Exercise Goals Review Increase Physical Activity;Able to understand and use rate of perceived exertion (RPE) scale;Knowledge and understanding of Target Heart Rate Range (THRR);Understanding of Exercise Prescription;Able to check pulse independently;Increase Strength and Stamina;Able to understand and use Dyspnea scale -- Increase Physical Activity;Able to understand and use rate of perceived exertion (RPE) scale;Knowledge and understanding of Target Heart Rate Range (THRR);Understanding of Exercise Prescription;Able to check pulse independently;Increase Strength and Stamina;Able to understand and use Dyspnea scale Increase Physical Activity;Increase Strength and Stamina Increase Physical Activity;Increase Strength and Stamina;Understanding of Exercise Prescription   Comments Arthi has a bruised meniscus and her Dr recommended her not do the TM for now.  Staff will monitor progress. out since last review Tniyah is back for the first time since her last review. She has not been exercising at home due to knee problems - starting PT on Friday. MD says she  can walk on a flat surface and seated machines that don't irritate her knee (XR hurt her knee). Will monitor to see how she progresses and re-evaluate next goal review. Shawntina is doing PT on M/W in addition to her LW sessions.  Today she is doing the T5 and walking track. Larcenia is doing ewll in rehab. She is doing fine with T5 NuStep and her knee is feeling better. We will continue to monitor her progress.   Expected Outcomes Short:  maintain consistent exercise Long: get back to walking -- ST: attend rehab and PT LT: exercise 150 minutes of moderate or 70 minutes of vigorous exercise per week with at least 2 days of weight training. Short: continue PT and LW Long: increase stamina Short: Continue to use PT for knee  Long; Continue to improve stamina.   Row Name 08/06/20 0827 09/17/20 1357 09/29/20 1529 10/28/20 1551 10/30/20 0936     Exercise Goal Re-Evaluation   Exercise Goals Review -- Increase Physical Activity;Increase Strength and Stamina -- Increase Physical Activity;Increase Strength and Stamina;Understanding of Exercise Prescription Increase Physical Activity;Increase Strength and Stamina;Understanding of Exercise Prescription   Comments Out with knee since last review. Rheda is just getting back into exercise after being for several weeks.  We will monitor progress. Out with COVID since last review Last attended on 2/17 then been out with appointments. Carissa has been out with various things going on.  She has not been doing much exercise at home and her depression has gone up some. She is going to get back her exercise routine.   Expected Outcomes -- Short: get back to regular attendance Long: improve overall stamina -- Short: get back to regular attendance Long: improve overall stamina Short: Get back to regular exercise again Long: Continue to improve stamina.          Discharge Exercise Prescription (Final Exercise Prescription Changes):  Exercise Prescription Changes - 10/16/20 1300      Response to Exercise   Blood Pressure (Admit) 126/72    Blood Pressure (Exercise) 198/84    Blood Pressure (Exit) 104/68    Heart Rate (Admit) 76 bpm    Heart Rate (Exercise) 100 bpm    Heart Rate (Exit) 92 bpm    Oxygen Saturation (Admit) 94 %    Oxygen Saturation (Exercise) 95 %    Oxygen Saturation (Exit) 95 %    Rating of Perceived Exertion (Exercise) 12    Perceived Dyspnea (Exercise) 3    Comments first day back    Duration Continue with 30 min of aerobic exercise without signs/symptoms of physical distress.      Progression   Progression Continue to progress workloads to maintain intensity without signs/symptoms of physical distress.    Average METs 3.43      Resistance Training    Training Prescription Yes    Weight 4 lb    Reps 10-15      Interval Training   Interval Training No      Treadmill   MPH 2.5    Grade 1    Minutes 15    METs 3.26      T5 Nustep   Level 5    METs 3.43           Nutrition:  Target Goals: Understanding of nutrition guidelines, daily intake of sodium <1500mg, cholesterol <200mg, calories 30% from fat and 7% or less from saturated fats, daily to have 5 or more servings of fruits and vegetables.  Education: All About   Nutrition: -Group instruction provided by verbal, written material, interactive activities, discussions, models, and posters to present general guidelines for heart healthy nutrition including fat, fiber, MyPlate, the role of sodium in heart healthy nutrition, utilization of the nutrition label, and utilization of this knowledge for meal planning. Follow up email sent as well. Written material given at graduation. Flowsheet Row Pulmonary Rehab from 09/04/2020 in [facility]  Date 07/10/20  Educator [facility]  Instruction Review Code 1- Verbalizes Understanding      Biometrics:    Nutrition Therapy Plan and Nutrition Goals:  Nutrition Therapy & Goals - 07/08/20 0849      Nutrition Therapy   Diet heart healthy, low Na    Protein (specify units) 90g    Fiber 25 grams    Whole Grain Foods 3 servings    Saturated Fats 12 max. grams    Fruits and Vegetables 8 servings/day    Sodium 1.5 grams      Personal Nutrition Goals   Nutrition Goal ST; include 2 additional vegetables or fruit in snacks or breakfast, try 3 new vegetables for dinner meals - interested in honey-nut squash LT: include 8 fruits or vegetables every day (or most days), include a variety (all the colors most weeks)    Comments B: Bagel (cream cheese - sometimes strawberry low-fat) or cinnamon and raisin (peanut butter - no sugar or salt) or scrambled egg (2 eggs) - whole wheat bread dry.  S: pack of peanut butter crackers. L: ham  sandwich or pimento cheese sandwich and chips on whole wheat.  S: another pack of peanut butter crackers D: sometimes eat out. At home chicken and green beans (airfrier and frozen vegetables) or another bagel. Doesn't use fat to cook - maybe some oil and garlic, salt and pepper on green beans. No longer in weight watchers. Vegetables: green beans, corn, potatoes, peas, black beans, butter beans. Drink: tea - unsweet. water with crystal light. Coffee x2 - stevia and coffee mate (creamer). Reviewed heart healthy eating and the importance of getting enough vegetables and a variety as well as fat with meals to absorb fat-soluble vitamins.      Intervention Plan   Intervention Prescribe, educate and counsel regarding individualized specific dietary modifications aiming towards targeted core components such as weight, hypertension, lipid management, diabetes, heart failure and other comorbidities.;Nutrition handout(s) given to patient.    Expected Outcomes Short Term Goal: Understand basic principles of dietary content, such as calories, fat, sodium, cholesterol and nutrients.;Short Term Goal: A plan has been developed with personal nutrition goals set during dietitian appointment.;Long Term Goal: Adherence to prescribed nutrition plan.           Nutrition Assessments:  MEDIFICTS Score Key:  ?70 Need to make dietary changes   40-70 Heart Healthy Diet  ? 40 Therapeutic Level Cholesterol Diet   Picture Your Plate Scores:  <40 Unhealthy dietary pattern with much room for improvement.  41-50 Dietary pattern unlikely to meet recommendations for good health and room for improvement.  51-60 More healthful dietary pattern, with some room for improvement.   >60 Healthy dietary pattern, although there may be some specific behaviors that could be improved.   Nutrition Goals Re-Evaluation:  Nutrition Goals Re-Evaluation    Row Name 10/16/20 0927 10/30/20 0932           Goals   Nutrition Goal --  Short: continue current habits and add more vegetables Long:  get a variety of heart healthy foods in meal   plan      Comment Isola is using an air fryer, cutting down on bread and sweets.  She is getting back into [employer].  She has cottage cheese with no sugar fruit cocktail for snacks. Takeyla has not been eating well.  With her depression, she has been turing to junk foods.  She wants to get away from it and wants to work on getting back to it.  She is part of [employer] but has not committed to the program yet.  She is going to try to get back to her healthy habits.      Expected Outcome Short: continue current habits and add more vegetables Long:  get a variety of heart healthy foods in meal plan Short: Get into and commit to [employer] program Long; Get back to healthy diet             Nutrition Goals Discharge (Final Nutrition Goals Re-Evaluation):  Nutrition Goals Re-Evaluation - 10/30/20 0932      Goals   Nutrition Goal Short: continue current habits and add more vegetables Long:  get a variety of heart healthy foods in meal plan    Comment Meaghen has not been eating well.  With her depression, she has been turing to junk foods.  She wants to get away from it and wants to work on getting back to it.  She is part of [employer] but has not committed to the program yet.  She is going to try to get back to her healthy habits.    Expected Outcome Short: Get into and commit to [employer] program Long; Get back to healthy diet           Psychosocial: Target Goals: Acknowledge presence or absence of significant depression and/or stress, maximize coping skills, provide positive support system. Participant is able to verbalize types and ability to use techniques and skills needed for reducing stress and depression.   Education: Stress, Anxiety, and Depression - Group verbal and visual presentation to define topics covered.  Reviews how body is impacted by stress,  anxiety, and depression.  Also discusses healthy ways to reduce stress and to treat/manage anxiety and depression.  Written material given at graduation.   Education: Sleep Hygiene -Provides group verbal and written instruction about how sleep can affect your health.  Define sleep hygiene, discuss sleep cycles and impact of sleep habits. Review good sleep hygiene tips.    Initial Review & Psychosocial Screening:   Quality of Life Scores:  Scores of 19 and below usually indicate a poorer quality of life in these areas.  A difference of  2-3 points is a clinically meaningful difference.  A difference of 2-3 points in the total score of the Quality of Life Index has been associated with significant improvement in overall quality of life, self-image, physical symptoms, and general health in studies assessing change in quality of life.  PHQ-9: Recent Review Flowsheet Data    Depression screen [facility] 2/9 10/30/2020 09/02/2020 05/13/2020 04/29/2020 12/24/2019   Decreased Interest 1 1 0 0 0   Down, Depressed, Hopeless 3 1 0 0 0   PHQ - 2 Score 4 2 0 0 0   Altered sleeping _0 Tired, decreased energy _1 Change in appetite _2 0 0   Feeling bad or failure about yourself  2 1 0 0 0   Trouble concentrating _3 1  Moving slowly or fidgety/restless 0 0 0 0 0   Suicidal thoughts 0 0 0 0 0   PHQ-9 Score 16 12 5 5 4   Difficult doing work/chores Somewhat difficult Somewhat difficult Somewhat difficult Somewhat difficult Not difficult at all     Interpretation of Total Score  Total Score Depression Severity:  1-4 = Minimal depression, 5-9 = Mild depression, 10-14 = Moderate depression, 15-19 = Moderately severe depression, 20-27 = Severe depression   Psychosocial Evaluation and Intervention:   Psychosocial Re-Evaluation:  Psychosocial Re-Evaluation    Row Name 07/01/20 0814 07/08/20 0822 10/16/20 0929 10/30/20 0924       Psychosocial Re-Evaluation   Current issues with History  of Depression;Current Psychotropic Meds;Current Depression;Current Sleep Concerns -- -- History of Depression;Current Psychotropic Meds;Current Depression;Current Sleep Concerns    Comments She is on Effexor to manage depression. She reports no symptoms of depression in last couple of weeks and does not go to talk therapy. She reports that she can't find anyone for talk therapy. Discussed some options to look for therapists including [facility] since she is not having luck finding anyone in person who is taking new patients. She will read, crochet to reduce stress. She reports getting 4-5 hours of sleep each night - sleep maintenance insomia - tried melatonin, but didn't see any difference. Discussed sleep hygiene. Tashaya says she has good and bad days as far as depression.  She still is on Effexor.  She has spoken to her Dr about sleep and has a prescirption for trazadone but has only tried it once.  We discussed trying some realxing music if she wakes up to help her get back to sleep. Kaylyne has slept well the past 2 nights.  She has started her CPAP 3 weeks ago.  She is stil on Effexor.  She has felt more depressed in past couple weeks.  She takes medication and uses positive self talk to feel better. Zia is starting Wellbutrin today with effexor and d/c's celexa.  Her PHQ score went up some since Januray. She goes back in 4 weeks and is seeing someone new.  She has not been in rehab for a few weeks with appointments and oversleeping.  She is sleeping more, but now too much.  She is feeling down and depressed, but can't pinpoint any one trigger.  We will reasess in two weeks.    Expected Outcomes ST: Find therapist, try sleep hygiene techniques. LT: continue to manage depression, sleep throughout the night. Short: try music to help with sleep long: develop better sleep patterns Short: continue to work on using CPAP Long: develop good sleep patterns Short: Try new med out Long: Continue to find the positive  and use coping skills.    Interventions Encouraged to attend Pulmonary Rehabilitation for the exercise -- Encouraged to attend Pulmonary Rehabilitation for the exercise Encouraged to attend Pulmonary Rehabilitation for the exercise    Continue Psychosocial Services  Follow up required by staff -- Follow up required by staff --    Comments She reports no current stress concerns, she is still working to manage her depression and sleep. -- -- --           Psychosocial Discharge (Final Psychosocial Re-Evaluation):  Psychosocial Re-Evaluation - 10/30/20 0924      Psychosocial Re-Evaluation   Current issues with History of Depression;Current Psychotropic Meds;Current Depression;Current Sleep Concerns    Comments Emslee is starting Wellbutrin today with effexor and d/c's celexa.  Her PHQ score went up   some since [place]. She goes back in 4 weeks and is seeing someone new.  She has not been in rehab for a few weeks with appointments and oversleeping.  She is sleeping more, but now too much.  She is feeling down and depressed, but can't pinpoint any one trigger.  We will reasess in two weeks.    Expected Outcomes Short: Try new med out Long: Continue to find the positive and use coping skills.    Interventions Encouraged to attend Pulmonary Rehabilitation for the exercise           Education: Education Goals: Education classes will be provided on a weekly basis, covering required topics. Participant will state understanding/return demonstration of topics presented.  Learning Barriers/Preferences:   General Pulmonary Education Topics:  Infection Prevention: - Provides verbal and written material to individual with discussion of infection control including proper hand washing and proper equipment cleaning during exercise session. Flowsheet Row Pulmonary Rehab from 09/04/2020 in [facility] Cardiac and Pulmonary Rehab  Date 04/29/20  Educator [facility]  Instruction Review Code 1- Verbalizes Understanding       Falls Prevention: - Provides verbal and written material to individual with discussion of falls prevention and safety. Flowsheet Row Pulmonary Rehab from 09/04/2020 in [facility] Cardiac and Pulmonary Rehab  Date 04/29/20  Educator [facility]  Instruction Review Code 1- Verbalizes Understanding      Chronic Lung Disease Review: - Group verbal instruction with posters, models, PowerPoint presentations and videos,  to review new updates, new respiratory medications, new advancements in procedures and treatments. Providing information on websites and "800" numbers for continued self-education. Includes information about supplement oxygen, available portable oxygen systems, continuous and intermittent flow rates, oxygen safety, concentrators, and Medicare reimbursement for oxygen. Explanation of Pulmonary Drugs, including class, frequency, complications, importance of spacers, rinsing mouth after steroid MDI's, and proper cleaning methods for nebulizers. Review of basic lung anatomy and physiology related to function, structure, and complications of lung disease. Review of risk factors. Discussion about methods for diagnosing sleep apnea and types of masks and machines for OSA. Includes a review of the use of types of environmental controls: home humidity, furnaces, filters, dust mite/pet prevention, HEPA vacuums. Discussion about weather changes, air quality and the benefits of nasal washing. Instruction on Warning signs, infection symptoms, calling MD promptly, preventive modes, and value of vaccinations. Review of effective airway clearance, coughing and/or vibration techniques. Emphasizing that all should Create an Action Plan. Written material given at graduation. Flowsheet Row Pulmonary Rehab [facility]04/2020 [facility] Card[facility] and Pulmonary Rehab  Date 04/29/20  Instruction Review Code 3- Needs Reinforcement  [need identified]      AED/CPR: - Group verbal and written instruction with the use of models to  demonstrate the basic use of the AED with the basic ABC's of resuscitation.    Anatomy and Cardiac Procedures: - Group verbal and visual presentation and models provide information about basic cardiac anatomy and function. Reviews the testing methods done to diagnose heart disease and the outcomes of the test results. Describes the treatment choices: Medical Management, Angioplasty, or Coronary Bypass Surgery for treating various heart conditions including Myocardial Infarction, Angina, Valve Disease, and Cardiac Arrhythmias.  Written material given at graduation.   Medication Safety: - Group verbal and visual instruction to review commonly prescribed medications for heart and lung disease. Reviews the medication, class of the drug, and side effects. Includes the steps to properly store meds and maintain the prescription regimen.  Written material given at graduation.  Other: -Provides group and verbal instruction on various topics (see comments)   Knowledge Questionnaire Score:    Core Components/Risk Factors/Patient Goals at Admission:   Education:Diabetes - Individual verbal and written instruction to review signs/symptoms of diabetes, desired ranges of glucose level fasting, after meals and with exercise. Acknowledge that pre and post exercise glucose checks will be done for 3 sessions at entry of program.   Know Your Numbers and Heart Failure: - Group verbal and visual instruction to discuss disease risk factors for cardiac and pulmonary disease and treatment options.  Reviews associated critical values for Overweight/Obesity, Hypertension, Cholesterol, and Diabetes.  Discusses basics of heart failure: signs/symptoms and treatments.  Introduces Heart Failure Zone chart for action plan for heart failure.  Written material given at graduation.   Core Components/Risk Factors/Patient Goals Review:   Goals and Risk Factor Review    Row Name 07/01/20 0826 07/08/20 0809 10/16/20 0922  10/30/20 0930       Core Components/Risk Factors/Patient Goals Review   Personal Goals Review Weight Management/Obesity;Improve shortness of breath with ADL's;Hypertension Weight Management/Obesity;Improve shortness of breath with ADL's;Hypertension Improve shortness of breath with ADL's;Hypertension Improve shortness of breath with ADL's;Hypertension;Weight Management/Obesity    Review She reports her weight has been stable - has not met with RD regarding nutrition yet. She reports SOB is good unless she has a coughing spell - hasn't had to use her inhaler in a week or two. She only takes nebulizer in morning because it makes her shake. She reports her BP has been good at rehab, she doesn't have a BP cuff , but will buy one. SOB has been ok lately except when she has a coughing spell.  She is doing PT for knee and was advised not to do TM for now, but can walk on track.  She is taking meds as directed.  She is going to order a BP cuff ffrom [place] today. Today is Avika's first day back after missing a couple weeks.  SOB has been bad since she had Covid.  She just finished prednisone.  Knee is better and Deosha can use TM.  She still has trouble going up steps.  BP was 126/72 today and has been good when she went to Dr. Marcelline Mates is hoping that her new meds will help her with her weight loss.  It was up today as she has not been here.  She was up 14 lb since COVID.   Her breathing is still not where she wants it to be, but she is now getting back to exercise and working on it.  Blood pressures have been good and she checks them at home on occasion.  She has a cardiologist appt today.    Expected Outcomes ST: Get BP cuff, continue to take nebulizer. LT: meet with RD Short: order BP cuff, meet with RD today Long: monitor BP at home, follow RD recommendations Short: get back to regular attendance  Long:  build stamina back Short: Continue to work on weight loss.  Long: Continue to monitor risk factors.            Core Components/Risk Factors/Patient Goals at Discharge (Final Review):   Goals and Risk Factor Review - 10/30/20 0930      Core Components/Risk Factors/Patient Goals Review   Personal Goals Review Improve shortness of breath with ADL's;Hypertension;Weight Management/Obesity    Review Jenilee is hoping that her new meds will help her with her weight loss.  It was up today as she  has not been here.  She was up 14 lb since COVID.   Her breathing is still not where she wants it to be, but she is now getting back to exercise and working on it.  Blood pressures have been good and she checks them at home on occasion.  She has a cardiologist appt today.    Expected Outcomes Short: Continue to work on weight loss.  Long: Continue to monitor risk factors.           ITP Comments:  ITP Comments    Row Name 06/04/20 0548 06/10/20 1631 06/17/20 0719 06/25/20 1453 07/02/20 0640   ITP Comments 30 Day review completed. Medical Director ITP review done, changes made as directed, and signed approval by Medical Director. Teairra has called out the past couple of weeks with knee pain.  She is supposed to see her doctor this week to see what is going on.  She will be out of town on Thursday. Shantara called and said she will not be here for the next two weeks.  She also left message that her physician has started her with PT for her knee. Pt has not attended since last review. 30 Day review completed. Medical Director ITP review done, changes made as directed, and signed approval by Medical Director.   Row Name 07/22/20 1304 07/22/20 1531 07/30/20 0940 08/06/20 0827 08/27/20 0555   ITP Comments Pt has not attended since last review. Elleen will have surgery for a hernia tomorrow.  She will let staff know when she is cleared to return to exercise. 30 Day review completed. Medical Director ITP review done, changes made as directed, and signed approval by Medical Director. Called to check on patient.  She saw surgeon  yesterday and still having fluid built up around knee.  They want her to stay off her knee for about 4 weeks. They may have to drain it if it doesn't improve.  We will cancel appointments through the end of the year and she hopes to be able to return in January. 30 Day review completed. Medical Director ITP review done, changes made as directed, and signed approval by Medical Director.  Remains out with knee problems   Row Name 09/18/20 1232 09/23/20 1212 09/24/20 0918 10/22/20 0642 10/28/20 1550   ITP Comments Margaretta called 09/17/20 to let us know that she had tested positive for COVID.  She will be out until 09/30/20 Carey has only been to rehab a couple of times since last review - she is now out with COVID - unable to get goals this round. 30 Day review completed. Medical Director ITP review done, changes made as directed, and signed approval by Medical Director. 30 Day review completed. Medical Director ITP review done, changes made as directed, and signed approval by Medical Director. Called out last and this week, overslept and MD appts   Row Name 11/13/20 1124 11/19/20 0752         ITP Comments Raiza has still not been feeling well and has had other appointments.  She has not attended since last review. 30 Day review completed. Medical Director ITP review done, changes made as directed, and signed approval by Medical Director.             Comments:  

## 2020-11-25 ENCOUNTER — Encounter: Payer: Self-pay | Admitting: *Deleted

## 2020-11-25 ENCOUNTER — Telehealth: Payer: Self-pay | Admitting: *Deleted

## 2020-11-25 DIAGNOSIS — J4550 Severe persistent asthma, uncomplicated: Secondary | ICD-10-CM

## 2020-11-25 NOTE — Telephone Encounter (Signed)
Tried to call patient to check in.  Mailbox full.  Sent pt message via myChart.  Out since 10/30/20.

## 2020-12-04 ENCOUNTER — Other Ambulatory Visit: Payer: Self-pay

## 2020-12-04 ENCOUNTER — Encounter: Payer: Medicare Other | Attending: Pulmonary Disease

## 2020-12-04 DIAGNOSIS — J4550 Severe persistent asthma, uncomplicated: Secondary | ICD-10-CM | POA: Insufficient documentation

## 2020-12-04 DIAGNOSIS — I272 Pulmonary hypertension, unspecified: Secondary | ICD-10-CM | POA: Diagnosis present

## 2020-12-04 NOTE — Progress Notes (Signed)
Daily Session Note  Patient Details  Name: Sharon Cervantes MRN: [id number] Date of Birth: 05-Aug-1951 Referring Provider:   Flowsheet Row Pulmonary Rehab from 04/29/2020 in [facility] Cardiac and Pulmonary Rehab  Referring Provider Claudette Stapler MD      Encounter Date: 12/04/2020  Check In:  Session Check In - 12/04/20 0918      Check-In   Supervising physician immediately available to respond to emergencies See telemetry face sheet for immediately available ER MD    Location [facility]-Cardiac & Pulmonary Rehab    Staff Present Birdie Sons, MPA, Mauricia Area, BS, ACSM CEP, Exercise Physiologist;Amanda Oletta Darter, BA, ACSM CEP, Exercise Physiologist    Virtual Visit No    Medication changes reported     Yes    Comments increased wellbutrin to 362m    Fall or balance concerns reported    No    Tobacco Cessation No Change    Warm-up and Cool-down Performed on first and last piece of equipment    Resistance Training Performed Yes    VAD Patient? No    PAD/SET Patient? No      Pain Assessment   Currently in Pain? No/denies              Social History   Tobacco Use  Smoking Status Never Smoker  Smokeless Tobacco Never Used    Goals Met:  Independence with exercise equipment Exercise tolerated well Personal goals reviewed No report of cardiac concerns or symptoms Strength training completed today  Goals Unmet:  Not Applicable  Comments: Pt able to follow exercise prescription today without complaint.  Will continue to monitor for progression.    Dr. MEmily Filbertis Medical Director for [name].

## 2020-12-09 ENCOUNTER — Other Ambulatory Visit: Payer: Self-pay

## 2020-12-09 DIAGNOSIS — J4550 Severe persistent asthma, uncomplicated: Secondary | ICD-10-CM | POA: Diagnosis not present

## 2020-12-09 NOTE — Progress Notes (Signed)
Daily Session Note  Patient Details  Name: Sharon Cervantes MRN: [id number] Date of Birth: 05-14-1951 Referring Provider:   Flowsheet Row Pulmonary Rehab from 04/29/2020 in [facility] Cardiac and Pulmonary Rehab  Referring Provider Claudette Stapler MD      Encounter Date: 12/09/2020  Check In:  Session Check In - 12/09/20 0915      Check-In   Supervising physician immediately available to respond to emergencies See telemetry face sheet for immediately available ER MD    Location [facility]-Cardiac & Pulmonary Rehab    Staff Present Birdie Sons, MPA, Elveria Rising, BA, ACSM CEP, Exercise Physiologist;Kara Eliezer Bottom, MS Exercise Physiologist    Virtual Visit No    Medication changes reported     No    Fall or balance concerns reported    No    Tobacco Cessation No Change    Warm-up and Cool-down Performed on first and last piece of equipment    Resistance Training Performed Yes    VAD Patient? No    PAD/SET Patient? No      Pain Assessment   Currently in Pain? No/denies              Social History   Tobacco Use  Smoking Status Never Smoker  Smokeless Tobacco Never Used    Goals Met:  Independence with exercise equipment Exercise tolerated well No report of cardiac concerns or symptoms Strength training completed today  Goals Unmet:  Not Applicable  Comments: Pt able to follow exercise prescription today without complaint.  Will continue to monitor for progression.    Dr. Emily Filbert is Medical Director for [place] and [name].

## 2020-12-17 ENCOUNTER — Encounter: Payer: Self-pay | Admitting: *Deleted

## 2020-12-17 DIAGNOSIS — J4550 Severe persistent asthma, uncomplicated: Secondary | ICD-10-CM

## 2020-12-17 NOTE — Progress Notes (Signed)
Pulmonary Individual Treatment Plan  Patient Details  Name: Mabelle Cavness MRN: [id number] Date of Birth: 10/03/1950 Referring Provider:   Flowsheet Row Pulmonary Rehab from 04/29/2020 in [facility] Re[facility]  Referring Provider Aleskerov, Fred MD      Initial Encounter Date:  Flowsheet Row Pulmonary Rehab from 04/29/2020 in [facility] Pulmonary Re[facility]  Date 04/29/20      Visit Diagnosis: Severe persistent asthma, unspecified whether complicated  Patient's Home Medications on Admission:  Current Outpatient Medications:  .  albuterol (PROVENTIL HFA;VENTOLIN HFA) 108 (90 Base) MCG/ACT inhaler, Inhale 2 puffs into the lungs every 6 (six) hours as needed for wheezing or shortness of breath. , Disp: , Rfl:  .  albuterol (PROVENTIL) (2.5 MG/3ML) 0.083% nebulizer solution, Take 2.5 mg by nebulization 2 (two) times daily as needed for wheezing or shortness of breath., Disp: , Rfl:  .  budesonide (PULMICORT) 0.5 MG/2ML nebulizer solution, Take 0.5 mg by nebulization 2 (two) times daily as needed (shortness of breath or wheezing). , Disp: , Rfl:  .  cyanocobalamin (,VITAMIN B-12,) 1000 MCG/ML injection, Inject 1,000 mcg into the muscle every 30 (thirty) days., Disp: , Rfl:  .  diclofenac (VOLTAREN) 50 MG EC tablet, Take 50 mg by mouth 2 (two) times daily., Disp: , Rfl:  .  diclofenac Sodium (VOLTAREN) 1 % GEL, Apply 1 application topically 4 (four) times daily as needed (pain)., Disp: , Rfl:  .  esomeprazole (NEXIUM) 40 MG capsule, Take 40 mg by mouth daily. , Disp: , Rfl:  .  FLUoxetine (PROZAC) 10 MG tablet, Take 10 mg by mouth daily., Disp: , Rfl:  .  furosemide (LASIX) 20 MG tablet, Take 20 mg by mouth daily as needed for edema., Disp: , Rfl:  .  gabapentin (NEURONTIN) 300 MG capsule, Take 300 mg by mouth 3 (three) times daily. , Disp: , Rfl:  .  hydrochlorothiazide (HYDRODIURIL) 12.5 MG tablet, Take 12.5 mg by mouth daily. , Disp: , Rfl:  .  HYDROcodone-acetaminophen (NORCO)  5-325 MG tablet, Take 1 tablet by mouth every 6 (six) hours as needed for up to 6 doses for moderate pain., Disp: 6 tablet, Rfl: 0 .  HYDROcodone-homatropine (HYCODAN) 5-1.5 MG/5ML syrup, Take 5 mLs by mouth every 6 (six) hours as needed for cough., Disp: , Rfl:  .  ibuprofen (ADVIL) 200 MG tablet, Take 800 mg by mouth every 6 (six) hours as needed for headache or moderate pain., Disp: , Rfl:  .  montelukast (SINGULAIR) 10 MG tablet, Take 1 tablet (10 mg total) by mouth at bedtime. appt further refills (Patient taking differently: Take 10 mg by mouth at bedtime. ), Disp: 90 tablet, Rfl: 1 .  oxymetazoline (AFRIN) 0.05 % nasal spray, Place 1 spray into both nostrils daily as needed (nose bleeds)., Disp: , Rfl:  .  pramipexole (MIRAPEX) 1.5 MG tablet, Take 1.5 mg by mouth at bedtime. , Disp: , Rfl:  .  traMADol (ULTRAM) 50 MG tablet, Take 50 mg by mouth every 8 (eight) hours as needed for moderate pain., Disp: , Rfl:  .  traZODone (DESYREL) 100 MG tablet, Take 100 mg by mouth at bedtime as needed for sleep., Disp: , Rfl:  .  venlafaxine XR (EFFEXOR-XR) 75 MG 24 hr capsule, Take 150 mg by mouth daily with breakfast. , Disp: , Rfl:  No current facility-administered medications for this visit.  Facility-Administered Medications Ordered in Other Visits:  .  [COMPLETED] sodium chloride 0.9 % nebulizer solution 3 mL, 3 mL,   Nebulization, Once, 3 mL at 10/03/18 1130 **FOLLOWED BY** [COMPLETED] methacholine (PROVOCHOLINE) inhaler solution 0.125 mg, 2 mL, Inhalation, Once, 0.125 mg at 10/03/18 1139 **FOLLOWED BY** methacholine (PROVOCHOLINE) inhaler solution 0.5 mg, 2 mL, Inhalation, Once **FOLLOWED BY** methacholine (PROVOCHOLINE) inhaler solution 2 mg, 2 mL, Inhalation, Once **FOLLOWED BY** methacholine (PROVOCHOLINE) inhaler solution 8 mg, 2 mL, Inhalation, Once **FOLLOWED BY** methacholine (PROVOCHOLINE) inhaler solution 32 mg, 2 mL, Inhalation, Once **FOLLOWED BY** [COMPLETED] albuterol (PROVENTIL) (2.5 MG/3ML)  0.083% nebulizer solution 2.5 mg, 2.5 mg, Nebulization, Once, Aleskerov, Fuad, MD, 2.5 mg at 10/03/18 1146  Past Medical History: Past Medical History:  Diagnosis Date  . Arthritis   . Asthma   . Chronic cough   . Colon polyps   . Depression   . GERD (gastroesophageal reflux disease)   . Hiatal hernia    LARGE  . History of chicken pox   . Hypertension   . Macular degeneration   . Multinodular goiter    FNA in past neg h/o thyroid cysts  . Pernicious anemia    PERNICIOUS   . RLS (restless legs syndrome)   . Sleep apnea    NO CPAP    Tobacco Use: Social History   Tobacco Use  Smoking Status Never Smoker  Smokeless Tobacco Never Used    Labs: Recent Review Flowsheet Data    Labs for ITP Cardiac and Pulmonary Rehab Latest Ref Rng & Units 10/12/2018   Hemoglobin A1c 4.6 - 6.5 % 5.6       Pulmonary Assessment Scores:   UCSD: Self-administered rating of dyspnea associated with activities of daily living (ADLs) 6-point scale (0 = "not at all" to 5 = "maximal or unable to do because of breathlessness")  Scoring Scores range from 0 to 120.  Minimally important difference is 5 units  CAT: CAT can identify the health impairment of COPD patients and is better correlated with disease progression.  CAT has a scoring range of zero to 40. The CAT score is classified into four groups of low (less than 10), medium (10 - 20), high (21-30) and very high (31-40) based on the impact level of disease on health status. A CAT score over 10 suggests significant symptoms.  A worsening CAT score could be explained by an exacerbation, poor medication adherence, poor inhaler technique, or progression of COPD or comorbid conditions.  CAT MCID is 2 points  mMRC: mMRC (Modified Medical Research Council) Dyspnea Scale is used to assess the degree of baseline functional disability in patients of respiratory disease due to dyspnea. No minimal important difference is established. A decrease in score  of 1 point or greater is considered a positive change.   Pulmonary Function Assessment:   Exercise Target Goals: Exercise Program Goal: Individual exercise prescription set using results from initial 6 min walk test and THRR while considering  patient's activity barriers and safety.   Exercise Prescription Goal: Initial exercise prescription builds to 30-45 minutes a day of aerobic activity, 2-3 days per week.  Home exercise guidelines will be given to patient during program as part of exercise prescription that the participant will acknowledge.  Education: Aerobic Exercise: - Group verbal and visual presentation on the components of exercise prescription. Introduces F.I.T.T principle from ACSM for exercise prescriptions.  Reviews F.I.T.T. principles of aerobic exercise including progression. Written material given at graduation.   Education: Resistance Exercise: - Group verbal and visual presentation on the components of exercise prescription. Introduces F.I.T.T principle from ACSM for exercise prescriptions  Reviews F.I.T.T.   principles of resistance exercise including progression. Written material given at graduation.    Education: Exercise & Equipment Safety: - Individual verbal instruction and demonstration of equipment use and safety with use of the equipment. Flowsheet Row Pulmonary Rehab from 12/04/2020 in [facility] Pulmonary Re[facility]  Date 04/29/20  Educator [facility]  Instruction Review Code 1- Verbalizes Understanding      Education: Exercise Physiology & General Exercise Guidelines: - Group verbal and written instruction with models to review the exercise physiology of the cardiovascular system and associated critical values. Provides general exercise guidelines with specific guidelines to those with heart or lung disease.    Education: Flexibility, Balance, Mind/Body Relaxation: - Group verbal and visual presentation with interactive activity on the components of exercise  prescription. Introduces F.I.T.T principle from ACSM for exercise prescriptions. Reviews F.I.T.T. principles of flexibility and balance exercise training including progression. Also discusses the mind body connection.  Reviews various relaxation techniques to help reduce and manage stress (i.e. Deep breathing, progressive muscle relaxation, and visualization). Balance handout provided to take home. Written material given at graduation. Flowsheet Row Pulmonary Rehab from 12/04/2020 in [facility] Cardiac and Pulmonary Rehab  Date 09/04/20  Educator AS  Instruction Review Code 1- Verbalizes Understanding      Activity Barriers & Risk Stratification:   6 Minute Walk:  Oxygen Initial Assessment:   Oxygen Re-Evaluation:  Oxygen Re-Evaluation    [place] Name 07/01/20 0813 10/30/20 0928 12/04/20 0941         Program Oxygen Prescription   Program Oxygen Prescription None None None           Home Oxygen   Home Oxygen Device None None None     Sleep Oxygen Prescription CPAP  She has not recieved it yet CPAP CPAP     Liters per minute -- 0 --     Home Exercise Oxygen Prescription None None None     Home Resting Oxygen Prescription None None None     Compliance with Home Oxygen Use -- Yes Yes           Goals/Expected Outcomes   Short Term Goals To learn and understand importance of monitoring SPO2 with pulse oximeter and demonstrate accurate use of the pulse oximeter.;To learn and understand importance of maintaining oxygen saturations>88%;To learn and demonstrate proper pursed lip breathing techniques or other breathing techniques.;To learn and demonstrate proper use of respiratory medications To learn and understand importance of monitoring SPO2 with pulse oximeter and demonstrate accurate use of the pulse oximeter.;To learn and understand importance of maintaining oxygen saturations>88%;To learn and demonstrate proper pursed lip breathing techniques or other breathing techniques.;To learn and  demonstrate proper use of respiratory medications To learn and understand importance of monitoring SPO2 with pulse oximeter and demonstrate accurate use of the pulse oximeter.;To learn and understand importance of maintaining oxygen saturations>88%;To learn and demonstrate proper pursed lip breathing techniques or other breathing techniques.;To learn and demonstrate proper use of respiratory medications     Long  Term Goals Verbalizes importance of monitoring SPO2 with pulse oximeter and return demonstration;Maintenance of O2 saturations>88%;Exhibits proper breathing techniques, such as pursed lip breathing or other method taught during program session;Compliance with respiratory medication;Demonstrates proper use of MDI's Verbalizes importance of monitoring SPO2 with pulse oximeter and return demonstration;Maintenance of O2 saturations>88%;Exhibits proper breathing techniques, such as pursed lip breathing or other method taught during program session;Compliance with respiratory medication;Demonstrates proper use of MDI's Verbalizes importance of monitoring SPO2 with pulse oximeter and return demonstration;Maintenance of O2  saturations>88%;Exhibits proper breathing techniques, such as pursed lip breathing or other method taught during program session;Compliance with respiratory medication;Demonstrates proper use of MDI's     Comments Shivonne reports trying to remember to due pursed lip breathing, but it doesn't feel natural still. She takes her nebulizer in the morning, but not at night due to shaking. Sayla has gotten her CPAP.  She tries to use each night, but will usually wake with it off at some point.  Her breathing has been rough the last couple of weeks.  She had to do a breathing treatment today prior to class and is a little shaky today. Synai tries to use her PLB to help with her breathing. She is still only using her CPAP for 4 hrs as she is not sleeping more than that.  She has not needed breathing  treatments as much, and will do one later. We talked about getting once in a day if possible.     Goals/Expected Outcomes ST: continue to practice PLB LT: use PLB during activity. ST: continue to practice PLB,, continued compliance with CPAP LT: use PLB during activity. Short; Routine breathing treatment Long: Continue to use PLB and build tolerance to CPAP            Oxygen Discharge (Final Oxygen Re-Evaluation):  Oxygen Re-Evaluation - 12/04/20 0941      Program Oxygen Prescription   Program Oxygen Prescription None      Home Oxygen   Home Oxygen Device None    Sleep Oxygen Prescription CPAP    Home Exercise Oxygen Prescription None    Home Resting Oxygen Prescription None    Compliance with Home Oxygen Use Yes      Goals/Expected Outcomes   Short Term Goals To learn and understand importance of monitoring SPO2 with pulse oximeter and demonstrate accurate use of the pulse oximeter.;To learn and understand importance of maintaining oxygen saturations>88%;To learn and demonstrate proper pursed lip breathing techniques or other breathing techniques.;To learn and demonstrate proper use of respiratory medications    Long  Term Goals Verbalizes importance of monitoring SPO2 with pulse oximeter and return demonstration;Maintenance of O2 saturations>88%;Exhibits proper breathing techniques, such as pursed lip breathing or other method taught during program session;Compliance with respiratory medication;Demonstrates proper use of MDI's    Comments Alekhya tries to use her PLB to help with her breathing. She is still only using her CPAP for 4 hrs as she is not sleeping more than that.  She has not needed breathing treatments as much, and will do one later. We talked about getting once in a day if possible.    Goals/Expected Outcomes Short; Routine breathing treatment Long: Continue to use PLB and build tolerance to CPAP           Initial Exercise Prescription:   Perform Capillary Blood  Glucose checks as needed.  Exercise Prescription Changes:  Exercise Prescription Changes    Row Name 07/09/20 0900 09/17/20 1300 10/16/20 1300 12/08/20 1100       Response to Exercise   Blood Pressure (Admit) 116/70 112/76 126/72 120/68    Blood Pressure (Exercise) 120/66 126/64 198/84 132/64    Blood Pressure (Exit) 110/64 114/68 104/68 108/66    Heart Rate (Admit) 86 bpm 81 bpm 76 bpm 78 bpm    Heart Rate (Exercise) 108 bpm 98 bpm 100 bpm 95 bpm    Heart Rate (Exit) 90 bpm 87 bpm 92 bpm 81 bpm    Oxygen Saturation (Admit) 96 % 98 %  94 % 97 %    Oxygen Saturation (Exercise) 97 % 97 % 95 % 95 %    Oxygen Saturation (Exit) 97 % 99 % 95 % 96 %    Rating of Perceived Exertion (Exercise) 12 13 12 11     Perceived Dyspnea (Exercise) 2 1 3 2     Symptoms knee pain -- -- --    Comments -- -- first day back first day back    Duration Continue with 30 min of aerobic exercise without signs/symptoms of physical distress. Continue with 30 min of aerobic exercise without signs/symptoms of physical distress. Continue with 30 min of aerobic exercise without signs/symptoms of physical distress. Continue with 30 min of aerobic exercise without signs/symptoms of physical distress.    Intensity THRR unchanged THRR unchanged -- THRR unchanged         Progression   Progression Continue to progress workloads to maintain intensity without signs/symptoms of physical distress. Continue to progress workloads to maintain intensity without signs/symptoms of physical distress. Continue to progress workloads to maintain intensity without signs/symptoms of physical distress. Continue to progress workloads to maintain intensity without signs/symptoms of physical distress.    Average METs 2.66 2.55 3.43 2.5         Resistance Training   Training Prescription Yes Yes Yes Yes    Weight  3lb 4 lb 4 lb 4 lb    Reps 10-15 10-15 10-15 10-15         Interval Training   Interval Training No No No No         Treadmill    MPH 1.9 -- 2.5 --    Grade 1 -- 1 --    Minutes 15 -- 15 --    METs 2.72 -- 3.26 --         T5 Nustep   Level 5 5 5  --    Minutes 30 -- -- --    METs 2.6 3.1 3.43 --         Biostep-RELP   Level -- 4 -- 4    SPM -- 50 -- 50    Minutes -- 15 -- 15    METs -- 2 -- 2.5         Home Exercise Plan   Plans to continue exercise at Home (comment)  walking, gym Home (comment)  walking, gym -- Home (comment)  walking, gym    Frequency Add 2 additional days to program exercise sessions. Add 2 additional days to program exercise sessions. -- Add 2 additional days to program exercise sessions.    Initial Home Exercises Provided 05/13/20 05/13/20 -- 05/13/20           Exercise Comments:   Exercise Goals and Review:   Exercise Goals Re-Evaluation :  Exercise Goals Re-Evaluation    Row Name 07/01/20 0809 07/08/20 0811 07/09/20 0859 08/06/20 0827 09/17/20 1357     Exercise Goal Re-Evaluation   Exercise Goals Review Increase Physical Activity;Able to understand and use rate of perceived exertion (RPE) scale;Knowledge and understanding of Target Heart Rate Range (THRR);Understanding of Exercise Prescription;Able to check pulse independently;Increase Strength and Stamina;Able to understand and use Dyspnea scale Increase Physical Activity;Increase Strength and Stamina Increase Physical Activity;Increase Strength and Stamina;Understanding of Exercise Prescription -- Increase Physical Activity;Increase Strength and Elida is back for the first time since her last review. She has not been exercising at home due to knee problems - starting PT on Friday. MD says she can walk  on a flat surface and seated machines that don't irritate her knee (XR hurt her knee). Will monitor to see how she progresses and re-evaluate next goal review. Julena is doing PT on M/W in addition to her LW sessions.  Today she is doing the T5 and walking track. Clary is doing ewll in rehab. She is doing  fine with T5 NuStep and her knee is feeling better. We will continue to monitor her progress. Out with knee since last review. Fantasia is just getting back into exercise after being for several weeks.  We will monitor progress.   Expected Outcomes ST: attend rehab and PT LT: exercise 150 minutes of moderate or 70 minutes of vigorous exercise per week with at least 2 days of weight training. Short: continue PT and LW Long: increase stamina Short: Continue to use PT for knee Long; Continue to improve stamina. -- Short: get back to regular attendance Long: improve overall stamina   Row Name 09/29/20 1529 10/28/20 1551 10/30/20 0936 11/25/20 0938 12/04/20 0935     Exercise Goal Re-Evaluation   Exercise Goals Review -- Increase Physical Activity;Increase Strength and Stamina;Understanding of Exercise Prescription Increase Physical Activity;Increase Strength and Stamina;Understanding of Exercise Prescription -- Increase Physical Activity;Increase Strength and Stamina;Understanding of Exercise Prescription   Comments Out with COVID since last review Last attended on 2/17 then been out with appointments. Vanya has been out with various things going on.  She has not been doing much exercise at home and her depression has gone up some. She is going to get back her exercise routine. Out since last review Asianae returned today after being out sick and dealing with knee pain. She has not been doing much since she is out.  She wants to get back to it.  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  Her knee is still bothering her and the numbness is spreading more.  She was encouraged to call her orthopedist   Expected Outcomes -- Short: get back to regular attendance Long: improve overall stamina Short: Get back to regular exercise again Long: Continue to improve stamina. -- Short: Continue to attend regularly again Long: Continue to improve stamina again.   [place] Name 12/08/20 1156              Exercise Goal Re-Evaluation   Exercise Goals Review Increase Physical Activity;Increase Strength and Stamina       Comments First day back after extended absence       Expected Outcomes Short: get back to consistent attendance Long: build stamina              Discharge Exercise Prescription (Final Exercise Prescription Changes):  Exercise Prescription Changes - 12/08/20 1100      Response to Exercise   Blood Pressure (Admit) 120/68    Blood Pressure (Exercise) 132/64    Blood Pressure (Exit) 108/66    Heart Rate (Admit) 78 bpm    Heart Rate (Exercise) 95 bpm    Heart Rate (Exit) 81 bpm    Oxygen Saturation (Admit) 97 %    Oxygen Saturation (Exercise) 95 %    Oxygen Saturation (Exit) 96 %    Rating of Perceived Exertion (Exercise) 11    Perceived Dyspnea (Exercise) 2    Comments first day back    Duration Continue with 30 min of aerobic exercise without signs/symptoms of physical distress.    Intensity THRR unchanged      Progression   Progression  Continue to progress workloads to maintain intensity without signs/symptoms of physical distress.    Average METs 2.5      Resistance Training   Training Prescription Yes    Weight 4 lb    Reps 10-15      Interval Training   Interval Training No      Biostep-RELP   Level 4    SPM 50    Minutes 15    METs 2.5      Home Exercise Plan   Plans to continue exercise at Home (comment)   walking, gym   Frequency Add 2 additional days to program exercise sessions.    Initial Home Exercises Provided 05/13/20           Nutrition:  Target Goals: Understanding of nutrition guidelines, daily intake of sodium '1500mg'$ , cholesterol '200mg'$ , calories 30% from fat and 7% or less from saturated fats, daily to have 5 or more servings of fruits and vegetables.  Education: All About Nutrition: -Group instruction provided by verbal, written material, interactive activities, discussions, models, and posters to present general guidelines  for heart healthy nutrition including fat, fiber, MyPlate, the role of sodium in heart healthy nutrition, utilization of the nutrition label, and utilization of this knowledge for meal planning. Follow up email sent as well. Written material given at graduation. Flowsheet Row Pulmonary Rehab from 12/04/2020 in [facility] Cardiac and Pulmonary Rehab  Date 07/10/20  Educator [facility]  Instruction Review Code 1- Verbalizes Understanding      Biometrics:    Nutrition Therapy Plan and Nutrition Goals:  Nutrition Therapy & Goals - 07/08/20 0849      Nutrition Therapy   Diet heart healthy, low Na    Protein (specify units) 90g    Fiber 25 grams    Whole Grain Foods 3 servings    Saturated Fats 12 max. grams    Fruits and Vegetables 8 servings/day    Sodium 1.5 grams      Personal Nutrition Goals   Nutrition Goal ST; include 2 additional vegetables or fruit in snacks or breakfast, try 3 new vegetables for dinner meals - interested in honey-nut squash LT: include 8 fruits or vegetables every day (or most days), include a variety (all the colors most weeks)    Comments B: Bagel (cream cheese - sometimes strawberry low-fat) or cinnamon and raisin (peanut butter - no sugar or salt) or scrambled egg (2 eggs) - whole wheat bread dry.  S: pack of peanut butter crackers. L: ham sandwich or pimento cheese sandwich and chips on whole wheat.  S: another pack of peanut butter crackers D: sometimes eat out. At home chicken and green beans (airfrier and frozen vegetables) or another bagel. Doesn't use fat to cook - maybe some oil and garlic, salt and pepper on green beans. No longer in weight watchers. Vegetables: green beans, corn, potatoes, peas, black beans, butter beans. Drink: tea - unsweet. water with crystal light. Coffee x2 - stevia and coffee mate (creamer). Reviewed heart healthy eating and the importance of getting enough vegetables and a variety as well as fat with meals to absorb fat-soluble vitamins.       Intervention Plan   Intervention Prescribe, educate and counsel regarding individualized specific dietary modifications aiming towards targeted core components such as weight, hypertension, lipid management, diabetes, heart failure and other comorbidities.;Nutrition handout(s) given to patient.    Expected Outcomes Short Term Goal: Understand basic principles of dietary content, such as calories, fat, sodium, cholesterol and nutrients.;Short Term  Goal: A plan has been developed with personal nutrition goals set during dietitian appointment.;Long Term Goal: Adherence to prescribed nutrition plan.           Nutrition Assessments:  MEDIFICTS Score Key:  ?70 Need to make dietary changes   40-70 Heart Healthy Diet  ? 40 Therapeutic Level Cholesterol Diet   Picture Your Plate Scores:  <44 Unhealthy dietary pattern with much room for improvement.  41-50 Dietary pattern unlikely to meet recommendations for good health and room for improvement.  51-60 More healthful dietary pattern, with some room for improvement.   >60 Healthy dietary pattern, although there may be some specific behaviors that could be improved.   Nutrition Goals Re-Evaluation:  Nutrition Goals Re-Evaluation    [place] Name 10/16/20 [phone] 10/30/20 0932 12/04/20 0948         Goals   Nutrition Goal -- Short: continue current habits and add more vegetables Long:  get a variety of heart healthy foods in meal plan Short: Get into and commit to Weight Watcher program Long; Get back to healthy diet     [place] is using an air fryer, cutting down on bread and sweets.  She is getting back into Weight Watchers.  She has cottage cheese with no sugar fruit cocktail for snacks. [place] has not been eating well.  With her depression, she has been turing to junk foods.  She wants to get away from it and wants to work on getting back to it.  She is part of Weight Watchers but has not committed to the program yet.  She is going to try  to get back to her healthy habits. Ad[place] is trying to get better with her diet.  She is still has not really gotten into weight watchers.  She has cut back on her junk food and really trying to get better.  Her A1c was up some with her blood work last week so she wants to really work on it.     Expected Outcome Short: continue current habits and add more vegetables Long:  get a variety of heart healthy foods in meal plan Short: Get into and commit to Weight Watcher program Long; Get back to healthy diet Short: Continue to cut back on junk food Long: Continue to get healthy diet choices            Nutrition Goals Discharge (Final Nutrition Goals Re-Evaluation):  Nutrition Goals Re-Evaluation - 12/04/20 0948      Goals   Nutrition Goal Short: Get into and commit to Weight Watcher program Long; Get back to healthy diet    [place] is trying to get better with her diet.  She is still has not really gotten into weight watchers.  She has cut back on her junk food and really trying to get better.  Her A1c was up some with her blood work last week so she wants to really work on it.    Expected Outcome Short: Continue to cut back on junk food Long: Continue to get healthy diet choices           Psychosocial: Target Goals: Acknowledge presence or absence of significant depression and/or stress, maximize coping skills, provide positive support system. Participant is able to verbalize types and ability to use techniques and skills needed for reducing stress and depression.   Education: Stress, Anxiety, and Depression - Group verbal and visual presentation to define topics covered.  Reviews how body is impacted by stress, anxiety, and  depression.  Also discusses healthy ways to reduce stress and to treat/manage anxiety and depression.  Written material given at graduation. Flowsheet Row Pulmonary Rehab from 12/04/2020 in [facility] Cardiac and Pulmonary Rehab  Date 12/04/20  Educator [facility]  Instruction  Review Code 1- [employer] Understanding      Education: Sleep Hygiene -Provides group verbal and written instruction about how sleep can affect your health.  Define sleep hygiene, discuss sleep cycles and impact of sleep habits. Review good sleep hygiene tips.    Initial Review & Psychosocial Screening:   Quality of Life Scores:  Scores of 19 and below usually indicate a poorer quality of life in these areas.  A difference of  2-3 points is a clinically meaningful difference.  A difference of 2-3 points in the total score of the Quality of Life Index has been associated with significant improvement in overall quality of life, self-image, physical symptoms, and general health in studies assessing change in quality of life.  PHQ-9: Recent Review Flowsheet Data    Depression screen [facility] 2/9 12/04/2020 10/30/2020 09/02/2020 05/13/2020 04/29/2020   Decreased Interest 2 1 1  0 0   Down, Depressed, Hopeless 1 3 1  0 0   PHQ - 2 Score 3 4 2  0 0   Altered sleeping 3 2 3 2 3    Tired, decreased energy 1 3 3 1 1    Change in appetite 3 3 2 1  0   Feeling bad or failure about yourself  1 2 1  0 0   Trouble concentrating 0 2 1 1 1    Moving slowly or fidgety/restless 0 0 0 0 0   Suicidal thoughts 0 0 0 0 0   PHQ-9 Score 11 16 12 5 5    Difficult doing work/chores Somewhat difficult Somewhat difficult Somewhat difficult Somewhat difficult Somewhat difficult     Interpretation of Total Score  Total Score Depression Severity:  1-4 = Minimal depression, 5-9 = Mild depression, 10-14 = Moderate depression, 15-19 = Moderately severe depression, 20-27 = Severe depression   Psychosocial Evaluation and Intervention:   Psychosocial Re-Evaluation:  Psychosocial Re-Evaluation    [place] Name 07/01/20 [phone] 07/08/20 8832 10/16/20 0929 10/30/20 0924 12/04/20 0943     Psychosocial Re-Evaluation   Current issues with History of Depression;Current Psychotropic Meds;Current Depression;Current Sleep Concerns -- -- History of  Depression;Current Psychotropic Meds;Current Depression;Current Sleep Concerns History of Depression;Current Psychotropic Meds;Current Depression;Current Sleep Concerns;Current Stress Concerns   Comments She is on Effexor to manage depression. She reports no symptoms of depression in last couple of weeks and does not go to talk therapy. She reports that she can't find anyone for talk therapy. Discussed some options to look for therapists including [facility] since she is not having luck finding anyone in person who is taking new patients. She will read, crochet to reduce stress. She reports getting 4-5 hours of sleep each night - sleep maintenance insomia - tried melatonin, but didn't see any difference. Discussed sleep hygiene. Kylei says she has good and bad days as far as depression.  She still is on Effexor.  She has spoken to her Dr about sleep and has a prescirption for trazadone but has only tried it once.  We discussed trying some realxing music if she wakes up to help her get back to sleep. Shantell has slept well the past 2 nights.  She has started her CPAP 3 weeks ago.  She is stil on Effexor.  She has felt more depressed in past couple weeks.  She  takes medication and uses positive self talk to feel better. Loriann is starting Wellbutrin today with effexor and d/c's celexa.  Her PHQ score went up some since Januray. She goes back in 4 weeks and is seeing someone new.  She has not been in rehab for a few weeks with appointments and oversleeping.  She is sleeping more, but now too much.  She is feeling down and depressed, but can't pinpoint any one trigger.  We will reasess in two weeks. Chery has increased her Wellbutrin to help with her depression to see if there will be a difference.  She is liking her new doctor a lot.  She is now not sleeping much and wakes up and does not go back to bed.  Her PHQ has improved to an 11 from 34 with the new meds.  Getting back to routine and exercise should help with  her sleep and new meds.  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   Expected Outcomes ST: Find therapist, try sleep hygiene techniques. LT: continue to manage depression, sleep throughout the night. Short: try music to help with sleep long: develop better sleep patterns Short: continue to work on using CPAP Long: develop good sleep patterns Short: Try new med out Long: Continue to find the positive and use coping skills. Short: Continue to work on med increase Long: Continue to focus on positive   Interventions Encouraged to attend Pulmonary Rehabilitation for the exercise -- Encouraged to attend Pulmonary Rehabilitation for the exercise Encouraged to attend Pulmonary Rehabilitation for the exercise Encouraged to attend Pulmonary Rehabilitation for the exercise   Continue Psychosocial Services  Follow up required by staff -- Follow up required by staff -- --   Comments She reports no current stress concerns, she is still working to manage her depression and sleep. -- -- -- --          Psychosocial Discharge (Final Psychosocial Re-Evaluation):  Psychosocial Re-Evaluation - 12/04/20 0943      Psychosocial Re-Evaluation   Current issues with History of Depression;Current Psychotropic Meds;Current Depression;Current Sleep Concerns;Current Stress Concerns    Comments Chery has increased her Wellbutrin to help with her depression to see if there will be a difference.  She is liking her new doctor a lot.  She is now not sleeping much and wakes up and does not go back to bed.  Her PHQ has improved to an 11 from 66 with the new meds.  Getting back to routine and exercise should help with her sleep and new meds.  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    Expected Outcomes Short: Continue to work on med increase Long: Continue to focus on positive    Interventions Encouraged to attend Pulmonary Rehabilitation for the exercise            Education: Education Goals: Education classes will be provided on a weekly basis, covering required topics. Participant will state understanding/return demonstration of topics presented.  Learning Barriers/Preferences:   General Pulmonary Education Topics:  Infection Prevention: - Provides verbal and written material to individual with discussion of infection control including proper hand washing and proper equipment cleaning during exercise session. Flowsheet Row Pulmonary Rehab from 12/04/2020 in [facility] Cardiac and Pulmonary Rehab  Date 04/29/20  Educator [facility]  Instruction Review Code 1- Verbalizes Understanding      Falls Prevention: - Provides verbal and written material to individual with discussion of falls prevention  and safety. Flowsheet Row Pulmonary Rehab from 12/04/2020 in [facility] Cardiac and Pulmonary Rehab  Date 04/29/20  Educator [facility]  Instruction Review Code 1- Verbalizes Understanding      Chronic Lung Disease Review: - Group verbal instruction with posters, models, PowerPoint presentations and videos,  to review new updates, new respiratory medications, new advancements in procedures and treatments. Providing information on websites and "800" numbers for continued self-education. Includes information about supplement oxygen, available portable oxygen systems, continuous and intermittent flow rates, oxygen safety, concentrators, and Medicare reimbursement for oxygen. Explanation of Pulmonary Drugs, including class, frequency, complications, importance of spacers, rinsing mouth after steroid MDI's, and proper cleaning methods for nebulizers. Review of basic lung anatomy and physiology related to function, structure, and complications of lung disease. Review of risk factors. Discussion about methods for diagnosing sleep apnea and types of masks and machines for OSA. Includes a review of the use of types of environmental controls: home humidity, furnaces, filters, dust mite/pet  prevention, HEPA vacuums. Discussion about weather changes, air quality and the benefits of nasal washing. Instruction on Warning signs, infection symptoms, calling MD promptly, preventive modes, and value of vaccinations. Review of effective airway clearance, coughing and/or vibration techniques. Emphasizing that all should Create an Action Plan. Written material given at graduation. Flowsheet Row Pulmonary Rehab from 12/04/2020 in [facility] Cardiac and Pulmonary Rehab  Date 04/29/20  Instruction Review Code 3- Needs Reinforcement  [need identified]      AED/CPR: - Group verbal and written instruction with the use of models to demonstrate the basic use of the AED with the basic ABC's of resuscitation.    Anatomy and Cardiac Procedures: - Group verbal and visual presentation and models provide information about basic cardiac anatomy and function. Reviews the testing methods done to diagnose heart disease and the outcomes of the test results. Describes the treatment choices: Medical Management, Angioplasty, or Coronary Bypass Surgery for treating various heart conditions including Myocardial Infarction, Angina, Valve Disease, and Cardiac Arrhythmias.  Written material given at graduation.   Medication Safety: - Group verbal and visual instruction to review commonly prescribed medications for heart and lung disease. Reviews the medication, class of the drug, and side effects. Includes the steps to properly store meds and maintain the prescription regimen.  Written material given at graduation.   Other: -Provides group and verbal instruction on various topics (see comments)   Knowledge Questionnaire Score:    Core Components/Risk Factors/Patient Goals at Admission:   Education:Diabetes - Individual verbal and written instruction to review signs/symptoms of diabetes, desired ranges of glucose level fasting, after meals and with exercise. Acknowledge that pre and post exercise glucose checks will  be done for 3 sessions at entry of program.   Know Your Numbers and Heart Failure: - Group verbal and visual instruction to discuss disease risk factors for cardiac and pulmonary disease and treatment options.  Reviews associated critical values for Overweight/Obesity, Hypertension, Cholesterol, and Diabetes.  Discusses basics of heart failure: signs/symptoms and treatments.  Introduces Heart Failure Zone chart for action plan for heart failure.  Written material given at graduation.   Core Components/Risk Factors/Patient Goals Review:   Goals and Risk Factor Review    Row Name 07/01/20 0826 07/08/20 0809 10/16/20 0922 10/30/20 0930 12/04/20 0939     Core Components/Risk Factors/Patient Goals Review   Personal Goals Review Weight Management/Obesity;Improve shortness of breath with ADL's;Hypertension Weight Management/Obesity;Improve shortness of breath with ADL's;Hypertension Improve shortness of breath with ADL's;Hypertension Improve shortness of breath with ADL's;Hypertension;Weight Management/Obesity  Improve shortness of breath with ADL's;Hypertension;Weight Management/Obesity   Review She reports her weight has been stable - has not met with RD regarding nutrition yet. She reports SOB is good unless she has a coughing spell - hasn't had to use her inhaler in a week or two. She only takes nebulizer in morning because it makes her shake. She reports her BP has been good at rehab, she doesn't have a BP cuff , but will buy one. SOB has been ok lately except when she has a coughing spell.  She is doing PT for knee and was advised not to do TM for now, but can walk on track.  She is taking meds as directed.  She is going to order a BP cuff ffrom [place] today. Today is Lielle's first day back after missing a couple weeks.  SOB has been bad since she had Covid.  She just finished prednisone.  Knee is better and Caretha can use TM.  She still has trouble going up steps.  BP was 126/72 today and has been good  when she went to Dr. Marcelline Mates is hoping that her new meds will help her with her weight loss.  It was up today as she has not been here.  She was up 14 lb since COVID.   Her breathing is still not where she wants it to be, but she is now getting back to exercise and working on it.  Blood pressures have been good and she checks them at home on occasion.  She has a cardiologist appt today. Sharonann saw her doctor on Tuesday and they requested her blood pressure reading to review.  We will fax those over.  Her weight has gone up since being out.  She is now up to 254 lb.  She wants to get control over it again.  They have also increased the wellbutrin which will help too.  She is struggling wiht her breathing with the pollen in the air.   Expected Outcomes ST: Get BP cuff, continue to take nebulizer. LT: meet with RD Short: order BP cuff, meet with RD today Long: monitor BP at home, follow RD recommendations Short: get back to regular attendance  Long:  build stamina back Short: Continue to work on weight loss.  Long: Continue to monitor risk factors. Short: Fax over BP for review Long: Work on weight loss again.          Core Components/Risk Factors/Patient Goals at Discharge (Final Review):   Goals and Risk Factor Review - 12/04/20 0939      Core Components/Risk Factors/Patient Goals Review   Personal Goals Review Improve shortness of breath with ADL's;Hypertension;Weight Management/Obesity    Review Jaylaa saw her doctor on Tuesday and they requested her blood pressure reading to review.  We will fax those over.  Her weight has gone up since being out.  She is now up to 254 lb.  She wants to get control over it again.  They have also increased the wellbutrin which will help too.  She is struggling wiht her breathing with the pollen in the air.    Expected Outcomes Short: Fax over BP for review Long: Work on weight loss again.           ITP Comments:  ITP Comments    Row Name 06/25/20 1453 07/02/20  0640 07/22/20 1304 07/22/20 1531 07/30/20 0940   ITP Comments Pt has not attended since last review. 30 Day review completed. Medical Director ITP review  done, changes made as directed, and signed approval by Medical Director. Pt has not attended since last review. Maude will have surgery for a hernia tomorrow.  She will let staff know when she is cleared to return to exercise. 30 Day review completed. Medical Director ITP review done, changes made as directed, and signed approval by Medical Director.   [place] Name 08/06/20 0827 08/27/20 0555 09/18/20 1232 09/23/20 1212 09/24/20 0918   ITP Comments Called to check on patient.  She saw surgeon yesterday and still having fluid built up around knee.  They want her to stay off her knee for about 4 weeks. They may have to drain it if it doesn't improve.  We will cancel appointments through the end of the year and she hopes to be able to return in January. 30 Day review completed. Medical Director ITP review done, changes made as directed, and signed approval by Medical Director.  Remains out with knee problems Priscillia called 09/17/20 to let us know that she had tested positive for COVID.  She will be out until 09/30/20 Jamyiah has only been to rehab a couple of times since last review - she is now out with COVID - unable to get goals this round. 30 Day review completed. Medical Director ITP review done, changes made as directed, and signed approval by Medical Director.   [place] Name 10/22/20 7017 10/28/20 1550 11/13/20 1124 11/19/20 0752 11/25/20 0938   ITP Comments 30 Day review completed. Medical Director ITP review done, changes made as directed, and signed approval by Medical Director. Called out last and this week, overslept and MD appts Timisha has still not been feeling well and has had other appointments.  She has not attended since last review. 30 Day review completed. Medical Director ITP review done, changes made as directed, and signed approval by Medical Director.  Tried to call patient to check in.  Mailbox full.  Sent pt message via myChart.  Out since 10/30/20.   [place] Name 12/17/20 1005           ITP Comments 30 Day review completed. Medical Director ITP review done, changes made as directed, and signed approval by Medical Director.              Comments:

## 2020-12-23 ENCOUNTER — Other Ambulatory Visit: Payer: Self-pay

## 2020-12-23 DIAGNOSIS — J4550 Severe persistent asthma, uncomplicated: Secondary | ICD-10-CM | POA: Diagnosis not present

## 2020-12-23 DIAGNOSIS — I272 Pulmonary hypertension, unspecified: Secondary | ICD-10-CM

## 2020-12-23 NOTE — Progress Notes (Signed)
Daily Session Note  Patient Details  Name: Sharon Cervantes MRN: [id number] Date of Birth: [birth year] Referring Provider:   Flowsheet Row Pulmonary Rehab from 04/29/2020 in [facility] Cardiac and Pulmonary Rehab  Referring Provider Claudette Stapler MD      Encounter Date: 12/23/2020  Check In:  Session Check In - 12/23/20 0929      Check-In   Supervising physician immediately available to respond to emergencies See telemetry face sheet for immediately available ER MD    Location [facility]-Cardiac & Pulmonary Rehab    Staff Present Birdie Sons, MPA, Elveria Rising, BA, ACSM CEP, Exercise Physiologist;Kara Eliezer Bottom, MS Exercise Physiologist    Virtual Visit No    Medication changes reported     No    Fall or balance concerns reported    No    Tobacco Cessation No Change    Warm-up and Cool-down Performed on first and last piece of equipment    Resistance Training Performed Yes    VAD Patient? No    PAD/SET Patient? No      Pain Assessment   Currently in Pain? No/denies              Social History   Tobacco Use  Smoking Status Never Smoker  Smokeless Tobacco Never Used    Goals Met:  Independence with exercise equipment Exercise tolerated well No report of cardiac concerns or symptoms Strength training completed today  Goals Unmet:  Not Applicable  Comments: Pt able to follow exercise prescription today without complaint.  Will continue to monitor for progression.    Dr. Emily Filbert is Medical Director for [name].

## 2020-12-30 ENCOUNTER — Encounter: Payer: Self-pay | Admitting: *Deleted

## 2020-12-30 DIAGNOSIS — J4550 Severe persistent asthma, uncomplicated: Secondary | ICD-10-CM

## 2020-12-30 NOTE — Progress Notes (Signed)
Discharge Progress Report  Patient Details  Name: Sharon Cervantes MRN: [id number] Date of Birth: 1951-06-08 Referring Provider:   Doristine DevoidFlowsheet Row [facility]  Referring Provider Georga HackingAleskerov, Fred MD       Number of Visits: 19  Reason for Discharge:  Early Exit:  Personal and moved  Smoking History:  Social History   Tobacco Use  Smoking Status Never Smoker  Smokeless Tobacco Never Used    Diagnosis:  Severe persistent asthma, unspecified whether complicated  ADL UCSD:   Initial Exercise Prescription:   Discharge Exercise Prescription (Final Exercise Prescription Changes):  Exercise Prescription Changes - 12/24/20 1600      Response to Exercise   Blood Pressure (Admit) 128/76    Blood Pressure (Exercise) 136/74    Blood Pressure (Exit) 122/68    Heart Rate (Admit) 78 bpm    Heart Rate (Exercise) 93 bpm    Heart Rate (Exit) 83 bpm    Oxygen Saturation (Admit) 97 %    Oxygen Saturation (Exercise) 95 %    Oxygen Saturation (Exit) 97 %    Rating of Perceived Exertion (Exercise) 12    Perceived Dyspnea (Exercise) 0    Duration Continue with 30 min of aerobic exercise without signs/symptoms of physical distress.    Intensity THRR unchanged      Progression   Progression Continue to progress workloads to maintain intensity without signs/symptoms of physical distress.    Average METs 2.6      Resistance Training   Training Prescription Yes    Weight 4 lb    Reps 10-15      Interval Training   Interval Training No      NuStep   Level 4    Minutes 15    METs 2.2      Biostep-RELP   Level 4    Minutes 15    METs 3      Home Exercise Plan   Plans to continue exercise at Home (comment)   walking, gym   Frequency Add 2 additional days to program exercise sessions.    Initial Home Exercises Provided 05/13/20           Functional Capacity:   Psychological, QOL, Others - Outcomes: PHQ 2/9: Depression  screen Sharon Cervantes 2/9 12/04/2020 10/30/2020 09/02/2020 05/13/2020 04/29/2020  Decreased Interest 2 1 1  0 0  Down, Depressed, Hopeless 1 3 1  0 0  PHQ - 2 Score 3 4 2  0 0  Altered sleeping 3 2 3 2 3   Tired, decreased energy 1 3 3 1 1   Change in appetite 3 3 2 1  0  Feeling bad or failure about yourself  1 2 1  0 0  Trouble concentrating 0 2 1 1 1   Moving slowly or fidgety/restless 0 0 0 0 0  Suicidal thoughts 0 0 0 0 0  PHQ-9 Score 11 16 12 5 5   Difficult doing work/chores Somewhat difficult Somewhat difficult Somewhat difficult Somewhat difficult Somewhat difficult  Some recent data might be hidden    Quality of Life:   Personal Goals: Goals established at orientation with interventions provided to work toward goal.    Personal Goals Discharge:  Goals and Risk Factor Review    Row Name 07/08/20 0809 10/16/20 [id number] 10/30/20 0930 12/04/20 [id number]       Core Components/Risk Factors/Patient Goals Review   Personal Goals Review Weight Management/Obesity;Improve shortness of breath with ADL's;Hypertension Improve shortness of breath with ADL's;Hypertension Improve shortness of  breath with ADL's;Hypertension;Weight Management/Obesity Improve shortness of breath with ADL's;Hypertension;Weight Management/Obesity    Review SOB has been ok lately except when she has a coughing spell.  She is doing PT for knee and was advised not to do TM for now, but can walk on track.  She is taking meds as directed.  She is going to order a BP cuff ffrom [employer] today. Today is Sharon Cervantes's first day back after missing a couple weeks.  SOB has been bad since she had Covid.  She just finished prednisone.  Knee is better and Sharon Cervantes can use TM.  She still has trouble going up steps.  BP was 126/72 today and has been good when she went to Dr. Valentino Saxon is hoping that her new meds will help her with her weight loss.  It was up today as she has not been here.  She was up 14 lb since COVID.   Her breathing is still not where she wants it to be, but  she is now getting back to exercise and working on it.  Blood pressures have been good and she checks them at home on occasion.  She has a cardiologist appt today. Sharon Cervantes saw her doctor on Tuesday and they requested her blood pressure reading to review.  We will fax those over.  Her weight has gone up since being out.  She is now up to 254 lb.  She wants to get control over it again.  They have also increased the wellbutrin which will help too.  She is struggling wiht her breathing with the pollen in the air.    Expected Outcomes Short: order BP cuff, meet with RD today Long: monitor BP at home, follow RD recommendations Short: get back to regular attendance  Long:  build stamina back Short: Continue to work on weight loss.  Long: Continue to monitor risk factors. Short: Fax over BP for review Long: Work on weight loss again.           Exercise Goals and Review:   Exercise Goals Re-Evaluation:  Exercise Goals Re-Evaluation    Row Name 07/08/20 0811 07/09/20 0859 08/06/20 0827 09/17/20 1357 09/29/20 1529     Exercise Goal Re-Evaluation   Exercise Goals Review Increase Physical Activity;Increase Strength and Stamina Increase Physical Activity;Increase Strength and Stamina;Understanding of Exercise Prescription -- Increase Physical Activity;Increase Strength and Stamina --   Comments Sharon Cervantes is doing PT on M/W in addition to her LW sessions.  Today she is doing the T5 and walking track. Sharon Cervantes is doing ewll in rehab. She is doing fine with T5 NuStep and her knee is feeling better. We will continue to monitor her progress. Out with knee since last review. Sharon Cervantes is just getting back into exercise after being for several weeks.  We will monitor progress. Out with COVID since last review   Expected Outcomes Short: continue PT and LW Long: increase stamina Short: Continue to use PT for knee Long; Continue to improve stamina. -- Short: get back to regular attendance Long: improve overall stamina --   Row  Name 10/28/20 1551 10/30/20 0936 11/25/20 0938 12/04/20 0935 12/08/20 1156     Exercise Goal Re-Evaluation   Exercise Goals Review Increase Physical Activity;Increase Strength and Stamina;Understanding of Exercise Prescription Increase Physical Activity;Increase Strength and Stamina;Understanding of Exercise Prescription -- Increase Physical Activity;Increase Strength and Stamina;Understanding of Exercise Prescription Increase Physical Activity;Increase Strength and Stamina   Comments Last attended on 2/17 then been out with appointments. Sharon Cervantes has been out with  various things going on.  She has not been doing much exercise at home and her depression has gone up some. She is going to get back her exercise routine. Out since last review Sharon Cervantes returned today after being out sick and dealing with knee pain. She has not been doing much since she is out.  She wants to get back to it.  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  Her knee is still bothering her and the numbness is spreading more.  She was encouraged to call her orthopedist First day back after extended absence   Expected Outcomes Short: get back to regular attendance Long: improve overall stamina Short: Get back to regular exercise again Long: Continue to improve stamina. -- Short: Continue to attend regularly again Long: Continue to improve stamina again. Short: get back to consistent attendance Long: build stamina   Row Name 12/24/20 1618             Exercise Goal Re-Evaluation   Exercise Goals Review Increase Physical Activity;Increase Strength and Stamina;Understanding of Exercise Prescription       Comments Sharon Cervantes sent myChart message that her knee was really bothering her today.  She is going to try the arm crank tomorrow to give her knee a break. We will talk with her about consistent attendance as well to build her stamina.  We will continue to montior her progress.       Expected Outcomes Short:  Consistent attendance Long: Continue to improve stamina.              Nutrition & Weight - Outcomes:    Nutrition:  Nutrition Therapy & Goals - 07/08/20 0849      Nutrition Therapy   Diet heart healthy, low Na    Protein (specify units) 90g    Fiber 25 grams    Whole Grain Foods 3 servings    Saturated Fats 12 max. grams    Fruits and Vegetables 8 servings/day    Sodium 1.5 grams      Personal Nutrition Goals   Nutrition Goal ST; include 2 additional vegetables or fruit in snacks or breakfast, try 3 new vegetables for dinner meals - interested in honey-nut squash LT: include 8 fruits or vegetables every day (or most days), include a variety (all the colors most weeks)    Comments B: Bagel (cream cheese - sometimes strawberry low-fat) or cinnamon and raisin (peanut butter - no sugar or salt) or scrambled egg (2 eggs) - whole wheat bread dry.  S: pack of peanut butter crackers. L: ham sandwich or pimento cheese sandwich and chips on whole wheat.  S: another pack of peanut butter crackers D: sometimes eat out. At home chicken and green beans (airfrier and frozen vegetables) or another bagel. Doesn't use fat to cook - maybe some oil and garlic, salt and pepper on green beans. No longer in weight watchers. Vegetables: green beans, corn, potatoes, peas, black beans, butter beans. Drink: tea - unsweet. water with crystal light. Coffee x2 - stevia and coffee mate (creamer). Reviewed heart healthy eating and the importance of getting enough vegetables and a variety as well as fat with meals to absorb fat-soluble vitamins.      Intervention Plan   Intervention Prescribe, educate and counsel regarding individualized specific dietary modifications aiming towards targeted core components such as weight, hypertension, lipid management, diabetes, heart failure and other comorbidities.;Nutrition handout(s) given to patient.    Expected Outcomes Short Term  Goal: Understand basic principles of dietary  content, such as calories, fat, sodium, cholesterol and nutrients.;Short Term Goal: A plan has been developed with personal nutrition goals set during dietitian appointment.;Long Term Goal: Adherence to prescribed nutrition plan.           Nutrition Discharge:   Education Questionnaire Score:   Goals reviewed with patient; copy given to patient.

## 2020-12-30 NOTE — Progress Notes (Signed)
Pulmonary Individual Treatment Plan  Patient Details  Name: Sharon Cervantes MRN: [id number] Date of Birth: 05/27/1951 Referring Provider:   Flowsheet Row Pulmonary Rehab from 04/29/2020 in [facility] Cardiac and Pulmonary Rehab  Referring Provider Aleskerov, Fred MD      Initial Encounter Date:  Flowsheet Row Pulmonary Rehab from 04/29/2020 in [facility] Pulmonary Rehab  Date 04/29/20      Visit Diagnosis: Severe persistent asthma, unspecified whether complicated  Patient's Home Medications on Admission:  Current Outpatient Medications:  .  albuterol (PROVENTIL HFA;VENTOLIN HFA) 108 (90 Base) MCG/ACT inhaler, Inhale 2 puffs into the lungs every 6 (six) hours as needed for wheezing or shortness of breath. , Disp: , Rfl:  .  albuterol (PROVENTIL) (2.5 MG/3ML) 0.083% nebulizer solution, Take 2.5 mg by nebulization 2 (two) times daily as needed for wheezing or shortness of breath., Disp: , Rfl:  .  budesonide (PULMICORT) 0.5 MG/2ML nebulizer solution, Take 0.5 mg by nebulization 2 (two) times daily as needed (shortness of breath or wheezing). , Disp: , Rfl:  .  cyanocobalamin (,VITAMIN B-12,) 1000 MCG/ML injection, Inject 1,000 mcg into the muscle every 30 (thirty) days., Disp: , Rfl:  .  diclofenac (VOLTAREN) 50 MG EC tablet, Take 50 mg by mouth 2 (two) times daily., Disp: , Rfl:  .  diclofenac Sodium (VOLTAREN) 1 % GEL, Apply 1 application topically 4 (four) times daily as needed (pain)., Disp: , Rfl:  .  esomeprazole (NEXIUM) 40 MG capsule, Take 40 mg by mouth daily. , Disp: , Rfl:  .  FLUoxetine (PROZAC) 10 MG tablet, Take 10 mg by mouth daily., Disp: , Rfl:  .  furosemide (LASIX) 20 MG tablet, Take 20 mg by mouth daily as needed for edema., Disp: , Rfl:  .  gabapentin (NEURONTIN) 300 MG capsule, Take 300 mg by mouth 3 (three) times daily. , Disp: , Rfl:  .  hydrochlorothiazide (HYDRODIURIL) 12.5 MG tablet, Take 12.5 mg by mouth daily. , Disp: , Rfl:  .  HYDROcodone-acetaminophen (NORCO)  5-325 MG tablet, Take 1 tablet by mouth every 6 (six) hours as needed for up to 6 doses for moderate pain., Disp: 6 tablet, Rfl: 0 .  HYDROcodone-homatropine (HYCODAN) 5-1.5 MG/5ML syrup, Take 5 mLs by mouth every 6 (six) hours as needed for cough., Disp: , Rfl:  .  ibuprofen (ADVIL) 200 MG tablet, Take 800 mg by mouth every 6 (six) hours as needed for headache or moderate pain., Disp: , Rfl:  .  montelukast (SINGULAIR) 10 MG tablet, Take 1 tablet (10 mg total) by mouth at bedtime. appt further refills (Patient taking differently: Take 10 mg by mouth at bedtime. ), Disp: 90 tablet, Rfl: 1 .  oxymetazoline (AFRIN) 0.05 % nasal spray, Place 1 spray into both nostrils daily as needed (nose bleeds)., Disp: , Rfl:  .  pramipexole (MIRAPEX) 1.5 MG tablet, Take 1.5 mg by mouth at bedtime. , Disp: , Rfl:  .  traMADol (ULTRAM) 50 MG tablet, Take 50 mg by mouth every 8 (eight) hours as needed for moderate pain., Disp: , Rfl:  .  traZODone (DESYREL) 100 MG tablet, Take 100 mg by mouth at bedtime as needed for sleep., Disp: , Rfl:  .  venlafaxine XR (EFFEXOR-XR) 75 MG 24 hr capsule, Take 150 mg by mouth daily with breakfast. , Disp: , Rfl:  No current facility-administered medications for this visit.  Facility-Administered Medications Ordered in Other Visits:  .  [COMPLETED] sodium chloride 0.9 % nebulizer solution 3 mL, 3 mL,   Nebulization, Once, 3 mL at 10/03/18 1130 **FOLLOWED BY** [COMPLETED] methacholine (PROVOCHOLINE) inhaler solution 0.125 mg, 2 mL, Inhalation, Once, 0.125 mg at 10/03/18 1139 **FOLLOWED BY** methacholine (PROVOCHOLINE) inhaler solution 0.5 mg, 2 mL, Inhalation, Once **FOLLOWED BY** methacholine (PROVOCHOLINE) inhaler solution 2 mg, 2 mL, Inhalation, Once **FOLLOWED BY** methacholine (PROVOCHOLINE) inhaler solution 8 mg, 2 mL, Inhalation, Once **FOLLOWED BY** methacholine (PROVOCHOLINE) inhaler solution 32 mg, 2 mL, Inhalation, Once **FOLLOWED BY** [COMPLETED] albuterol (PROVENTIL) (2.5 MG/3ML)  0.083% nebulizer solution 2.5 mg, 2.5 mg, Nebulization, Once, Aleskerov, Fuad, MD, 2.5 mg at 10/03/18 1146  Past Medical History: Past Medical History:  Diagnosis Date  . Arthritis   . Asthma   . Chronic cough   . Colon polyps   . Depression   . GERD (gastroesophageal reflux disease)   . Hiatal hernia    LARGE  . History of chicken pox   . Hypertension   . Macular degeneration   . Multinodular goiter    FNA in past neg h/o thyroid cysts  . Pernicious anemia    PERNICIOUS   . RLS (restless legs syndrome)   . Sleep apnea    NO CPAP    Tobacco Use: Social History   Tobacco Use  Smoking Status Never Smoker  Smokeless Tobacco Never Used    Labs: Recent Review Flowsheet Data    Labs for ITP Cardiac and Pulmonary Rehab Latest Ref Rng & Units 10/12/2018   Hemoglobin A1c 4.6 - 6.5 % 5.6       Pulmonary Assessment Scores:   UCSD: Self-administered rating of dyspnea associated with activities of daily living (ADLs) 6-point scale (0 = "not at all" to 5 = "maximal or unable to do because of breathlessness")  Scoring Scores range from 0 to 120.  Minimally important difference is 5 units  CAT: CAT can identify the health impairment of COPD patients and is better correlated with disease progression.  CAT has a scoring range of zero to 40. The CAT score is classified into four groups of low (less than 10), medium (10 - 20), high (21-30) and very high (31-40) based on the impact level of disease on health status. A CAT score over 10 suggests significant symptoms.  A worsening CAT score could be explained by an exacerbation, poor medication adherence, poor inhaler technique, or progression of COPD or comorbid conditions.  CAT MCID is 2 points  mMRC: mMRC (Modified Medical Research Council) Dyspnea Scale is used to assess the degree of baseline functional disability in patients of respiratory disease due to dyspnea. No minimal important difference is established. A decrease in score  of 1 point or greater is considered a positive change.   Pulmonary Function Assessment:   Exercise Target Goals: Exercise Program Goal: Individual exercise prescription set using results from initial 6 min walk test and THRR while considering  patient's activity barriers and safety.   Exercise Prescription Goal: Initial exercise prescription builds to 30-45 minutes a day of aerobic activity, 2-3 days per week.  Home exercise guidelines will be given to patient during program as part of exercise prescription that the participant will acknowledge.  Education: Aerobic Exercise: - Group verbal and visual presentation on the components of exercise prescription. Introduces F.I.T.T principle from ACSM for exercise prescriptions.  Reviews F.I.T.T. principles of aerobic exercise including progression. Written material given at graduation.   Education: Resistance Exercise: - Group verbal and visual presentation on the components of exercise prescription. Introduces F.I.T.T principle from ACSM for exercise prescriptions  Reviews F.I.T.T.   principles of resistance exercise including progression. Written material given at graduation.    Education: Exercise & Equipment Safety: - Individual verbal instruction and demonstration of equipment use and safety with use of the equipment. Flowsheet Row Pulmonary Rehab from 12/04/2020 in [facility] Card[facility] Pulmonary Rehab  Date 04/29/20  Educator [facility]  Instruction Review Code 1- Verbalizes Understanding      Education: Exercise Physiology & General Exercise Guidelines: - Group verbal and written instruction with models to review the exercise physiology of the cardiovascular system and associated critical values. Provides general exercise guidelines with specific guidelines to those with heart or lung disease.    Education: Flexibility, Balance, Mind/Body Relaxation: - Group verbal and visual presentation with interactive activity on the components of exercise  prescription. Introduces F.I.T.T principle from ACSM for exercise prescriptions. Reviews F.I.T.T. principles of flexibility and balance exercise training including progression. Also discusses the mind body connection.  Reviews various relaxation techniques to help reduce and manage stress (i.e. Deep breathing, progressive muscle relaxation, and visualization). Balance handout provided to take home. Written material given at graduation. Flowsheet Row Pulmonary Rehab from 12/04/2020 in [facility] Cardiac and Pulmonary Rehab  Date 09/04/20  Educator [facility]  Instruction Review Code 1- Verbalizes Understanding      Activity Barriers & Risk Stratification:   6 Minute Walk:  Oxygen Initial Assessment:   Oxygen Re-Evaluation:  Oxygen Re-Evaluation    Row Name 10/30/20 0928 12/04/20 0941           Program Oxygen Prescription   Program Oxygen Prescription None None             Home Oxygen   Home Oxygen Device None None      Sleep Oxygen Prescription CPAP CPAP      Liters per minute 0 --      Home Exercise Oxygen Prescription None None      Home Resting Oxygen Prescription None None      Compliance with Home Oxygen Use Yes Yes             Goals/Expected Outcomes   Short Term Goals To learn and understand importance of monitoring SPO2 with pulse oximeter and demonstrate accurate use of the pulse oximeter.;To learn and understand importance of maintaining oxygen saturations>88%;To learn and demonstrate proper pursed lip breathing techniques or other breathing techniques.;To learn and demonstrate proper use of respiratory medications To learn and understand importance of monitoring SPO2 with pulse oximeter and demonstrate accurate use of the pulse oximeter.;To learn and understand importance of maintaining oxygen saturations>88%;To learn and demonstrate proper pursed lip breathing techniques or other breathing techniques.;To learn and demonstrate proper use of respiratory medications      Long  Term  Goals Verbalizes importance of monitoring SPO2 with pulse oximeter and return demonstration;Maintenance of O2 saturations>88%;Exhibits proper breathing techniques, such as pursed lip breathing or other method taught during program session;Compliance with respiratory medication;Demonstrates proper use of MDI's Verbalizes importance of monitoring SPO2 with pulse oximeter and return demonstration;Maintenance of O2 saturations>88%;Exhibits proper breathing techniques, such as pursed lip breathing or other method taught during program session;Compliance with respiratory medication;Demonstrates proper use of MDI's      Comments Sharon Cervantes has gotten her CPAP.  She tries to use each night, but will usually wake with it off at some point.  Her breathing has been rough the last couple of weeks.  She had to do a breathing treatment today prior to class and is a little shaky today. Sharon Cervantes tries to use her PLB to  help with her breathing. She is still only using her CPAP for 4 hrs as she is not sleeping more than that.  She has not needed breathing treatments as much, and will do one later. We talked about getting once in a day if possible.      Goals/Expected Outcomes ST: continue to practice PLB,, continued compliance with CPAP LT: use PLB during activity. Short; Routine breathing treatment Long: Continue to use PLB and build tolerance to CPAP             Oxygen Discharge (Final Oxygen Re-Evaluation):  Oxygen Re-Evaluation - 12/04/20 0941      Program Oxygen Prescription   Program Oxygen Prescription None      Home Oxygen   Home Oxygen Device None    Sleep Oxygen Prescription CPAP    Home Exercise Oxygen Prescription None    Home Resting Oxygen Prescription None    Compliance with Home Oxygen Use Yes      Goals/Expected Outcomes   Short Term Goals To learn and understand importance of monitoring SPO2 with pulse oximeter and demonstrate accurate use of the pulse oximeter.;To learn and understand importance of  maintaining oxygen saturations>88%;To learn and demonstrate proper pursed lip breathing techniques or other breathing techniques.;To learn and demonstrate proper use of respiratory medications    Long  Term Goals Verbalizes importance of monitoring SPO2 with pulse oximeter and return demonstration;Maintenance of O2 saturations>88%;Exhibits proper breathing techniques, such as pursed lip breathing or other method taught during program session;Compliance with respiratory medication;Demonstrates proper use of MDI's    Comments Sharon Cervantes tries to use her PLB to help with her breathing. She is still only using her CPAP for 4 hrs as she is not sleeping more than that.  She has not needed breathing treatments as much, and will do one later. We talked about getting once in a day if possible.    Goals/Expected Outcomes Short; Routine breathing treatment Long: Continue to use PLB and build tolerance to CPAP           Initial Exercise Prescription:   Perform Capillary Blood Glucose checks as needed.  Exercise Prescription Changes:  Exercise Prescription Changes    Row Name 07/09/20 0900 09/17/20 1300 10/16/20 1300 12/08/20 1100 12/24/20 1600     Response to Exercise   Blood Pressure (Admit) 116/70 112/76 126/72 120/68 128/76   Blood Pressure (Exercise) 120/66 126/64 198/84 132/64 136/74   Blood Pressure (Exit) 110/64 114/68 104/68 108/66 122/68   Heart Rate (Admit) 86 bpm 81 bpm 76 bpm 78 bpm 78 bpm   Heart Rate (Exercise) 108 bpm 98 bpm 100 bpm 95 bpm 93 bpm   Heart Rate (Exit) 90 bpm 87 bpm 92 bpm 81 bpm 83 bpm   Oxygen Saturation (Admit) 96 % 98 % 94 % 97 % 97 %   Oxygen Saturation (Exercise) 97 % 97 % 95 % 95 % 95 %   Oxygen Saturation (Exit) 97 % 99 % 95 % 96 % 97 %   Rating of Perceived Exertion (Exercise) Perceived Dyspnea (Exercise) 0   Symptoms knee pain -- -- -- --   Comments -- -- first day back first day back --   Duration Continue with 30 min of aerobic  exercise without signs/symptoms of physical distress. Continue with 30 min of aerobic exercise without signs/symptoms of physical distress. Continue with 30 min of aerobic exercise without signs/symptoms of physical distress. Continue with 30  min of aerobic exercise without signs/symptoms of physical distress. Continue with 30 min of aerobic exercise without signs/symptoms of physical distress.   Intensity THRR unchanged THRR unchanged -- THRR unchanged THRR unchanged     Progression   Progression Continue to progress workloads to maintain intensity without signs/symptoms of physical distress. Continue to progress workloads to maintain intensity without signs/symptoms of physical distress. Continue to progress workloads to maintain intensity without signs/symptoms of physical distress. Continue to progress workloads to maintain intensity without signs/symptoms of physical distress. Continue to progress workloads to maintain intensity without signs/symptoms of physical distress.   Average METs 2.66 2.55 3.43 2.5 2.6     Resistance Training   Training Prescription Yes Yes Yes Yes Yes   Weight  3lb 4 lb 4 lb 4 lb 4 lb   Reps 10-15 10-15 10-15 10-15 10-15     Interval Training   Interval Training No No No No No     Treadmill   MPH 1.9 -- 2.5 -- --   Grade 1 -- 1 -- --   Minutes 15 -- 15 -- --   METs 2.72 -- 3.26 -- --     NuStep   Level -- -- -- -- 4   Minutes -- -- -- -- 15   METs -- -- -- -- 2.2     T5 Nustep   Level 5 5 5  -- --   Minutes 30 -- -- -- --   METs 2.6 3.1 3.43 -- --     Biostep-RELP   Level -- 4 -- 4 4   SPM -- 50 -- 50 --   Minutes -- 15 -- 15 15   METs -- 2 -- 2.5 3     Home Exercise Plan   Plans to continue exercise at Home (comment)  walking, gym Home (comment)  walking, gym -- Home (comment)  walking, gym Home (comment)  walking, gym   Frequency Add 2 additional days to program exercise sessions. Add 2 additional days to program exercise sessions. -- Add 2  additional days to program exercise sessions. Add 2 additional days to program exercise sessions.   Initial Home Exercises Provided 05/13/20 05/13/20 -- 05/13/20 05/13/20          Exercise Comments:   Exercise Goals and Review:   Exercise Goals Re-Evaluation :  Exercise Goals Re-Evaluation    Row Name 07/08/20 1610 07/09/20 0859 08/06/20 0827 09/17/20 1357 09/29/20 1529     Exercise Goal Re-Evaluation   Exercise Goals Review Increase Physical Activity;Increase Strength and Stamina Increase Physical Activity;Increase Strength and Stamina;Understanding of Exercise Prescription -- Increase Physical Activity;Increase Strength and Stamina --   Comments Kaizlee is doing PT on M/W in addition to her LW sessions.  Today she is doing the T5 and walking track. Anesia is doing ewll in rehab. She is doing fine with T5 NuStep and her knee is feeling better. We will continue to monitor her progress. Out with knee since last review. Dericka is just getting back into exercise after being for several weeks.  We will monitor progress. Out with COVID since last review   Expected Outcomes Short: continue PT and LW Long: increase stamina Short: Continue to use PT for knee Long; Continue to improve stamina. -- Short: get back to regular attendance Long: improve overall stamina --   Row Name 10/28/20 1551 10/30/20 0936 11/25/20 0938 12/04/20 0935 12/08/20 1156     Exercise Goal Re-Evaluation   Exercise Goals Review Increase Physical Activity;Increase Strength  and Stamina;Understanding of Exercise Prescription Increase Physical Activity;Increase Strength and Stamina;Understanding of Exercise Prescription -- Increase Physical Activity;Increase Strength and Stamina;Understanding of Exercise Prescription Increase Physical Activity;Increase Strength and Stamina   Comments Last attended on 2/17 then been out with appointments. Ajai has been out with various things going on.  She has not been doing much exercise at home  and her depression has gone up some. She is going to get back her exercise routine. Out since last review Jassmin returned today after being out sick and dealing with knee pain. She has not been doing much since she is out.  She wants to get back to it.  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  Her knee is still bothering her and the numbness is spreading more.  She was encouraged to call her orthopedist First day back after extended absence   Expected Outcomes Short: get back to regular attendance Long: improve overall stamina Short: Get back to regular exercise again Long: Continue to improve stamina. -- Short: Continue to attend regularly again Long: Continue to improve stamina again. Short: get back to consistent attendance Long: build stamina   Row Name 12/24/20 1618             Exercise Goal Re-Evaluation   Exercise Goals Review Increase Physical Activity;Increase Strength and Stamina;Understanding of Exercise Prescription       Comments Ileigh sent myChart message that her knee was really bothering her today.  She is going to try the arm crank tomorrow to give her knee a break. We will talk with her about consistent attendance as well to build her stamina.  We will continue to montior her progress.       Expected Outcomes Short: Consistent attendance Long: Continue to improve stamina.              Discharge Exercise Prescription (Final Exercise Prescription Changes):  Exercise Prescription Changes - 12/24/20 1600      Response to Exercise   Blood Pressure (Admit) 128/76    Blood Pressure (Exercise) 136/74    Blood Pressure (Exit) 122/68    Heart Rate (Admit) 78 bpm    Heart Rate (Exercise) 93 bpm    Heart Rate (Exit) 83 bpm    Oxygen Saturation (Admit) 97 %    Oxygen Saturation (Exercise) 95 %    Oxygen Saturation (Exit) 97 %    Rating of Perceived Exertion (Exercise) 12    Perceived Dyspnea (Exercise) 0    Duration Continue with 30 min of aerobic  exercise without signs/symptoms of physical distress.    Intensity THRR unchanged      Progression   Progression Continue to progress workloads to maintain intensity without signs/symptoms of physical distress.    Average METs 2.6      Resistance Training   Training Prescription Yes    Weight 4 lb    Reps 10-15      Interval Training   Interval Training No      NuStep   Level 4    Minutes 15    METs 2.2      Biostep-RELP   Level 4    Minutes 15    METs 3      Home Exercise Plan   Plans to continue exercise at Home (comment)   walking, gym   Frequency Add 2 additional days to program exercise sessions.    Initial Home Exercises Provided 05/13/20  Nutrition:  Target Goals: Understanding of nutrition guidelines, daily intake of sodium 1500mg , cholesterol 200mg , calories 30% from fat and 7% or less from saturated fats, daily to have 5 or more servings of fruits and vegetables.  Education: All About Nutrition: -Group instruction provided by verbal, written material, interactive activities, discussions, models, and posters to present general guidelines for heart healthy nutrition including fat, fiber, MyPlate, the role of sodium in heart healthy nutrition, utilization of the nutrition label, and utilization of this knowledge for meal planning. Follow up email sent as well. Written material given at graduation. Flowsheet Row Pulmonary Rehab from 12/04/2020 in [facility] Cardiac and Pulmonary Rehab  Date 07/10/20  Educator [facility]  Instruction Review Code 1- Verbalizes Understanding      Biometrics:    Nutrition Therapy Plan and Nutrition Goals:  Nutrition Therapy & Goals - 07/08/20 0849      Nutrition Therapy   Diet heart healthy, low Na    Protein (specify units) 90g    Fiber 25 grams    Whole Grain Foods 3 servings    Saturated Fats 12 max. grams    Fruits and Vegetables 8 servings/day    Sodium 1.5 grams      Personal Nutrition Goals   Nutrition Goal ST;  include 2 additional vegetables or fruit in snacks or breakfast, try 3 new vegetables for dinner meals - interested in honey-nut squash LT: include 8 fruits or vegetables every day (or most days), include a variety (all the colors most weeks)    Comments B: Bagel (cream cheese - sometimes strawberry low-fat) or cinnamon and raisin (peanut butter - no sugar or salt) or scrambled egg (2 eggs) - whole wheat bread dry.  S: pack of peanut butter crackers. L: ham sandwich or pimento cheese sandwich and chips on whole wheat.  S: another pack of peanut butter crackers D: sometimes eat out. At home chicken and green beans (airfrier and frozen vegetables) or another bagel. Doesn't use fat to cook - maybe some oil and garlic, salt and pepper on green beans. No longer in weight watchers. Vegetables: green beans, corn, potatoes, peas, black beans, butter beans. Drink: tea - unsweet. water with crystal light. Coffee x2 - stevia and coffee mate (creamer). Reviewed heart healthy eating and the importance of getting enough vegetables and a variety as well as fat with meals to absorb fat-soluble vitamins.      Intervention Plan   Intervention Prescribe, educate and counsel regarding individualized specific dietary modifications aiming towards targeted core components such as weight, hypertension, lipid management, diabetes, heart failure and other comorbidities.;Nutrition handout(s) given to patient.    Expected Outcomes Short Term Goal: Understand basic principles of dietary content, such as calories, fat, sodium, cholesterol and nutrients.;Short Term Goal: A plan has been developed with personal nutrition goals set during dietitian appointment.;Long Term Goal: Adherence to prescribed nutrition plan.           Nutrition Assessments:  MEDIFICTS Score Key:  ?70 Need to make dietary changes   40-70 Heart Healthy Diet  ? 40 Therapeutic Level Cholesterol Diet   Picture Your Plate Scores:  <26 Unhealthy dietary  pattern with much room for improvement.  41-50 Dietary pattern unlikely to meet recommendations for good health and room for improvement.  51-60 More healthful dietary pattern, with some room for improvement.   >60 Healthy dietary pattern, although there may be some specific behaviors that could be improved.   Nutrition Goals Re-Evaluation:  Nutrition Goals Re-Evaluation  Row Name 10/16/20 9163 10/30/20 0932 12/04/20 0948         Goals   Nutrition Goal -- Short: continue current habits and add more vegetables Long:  get a variety of heart healthy foods in meal plan Short: Get into and commit to Weight Watcher program Long; Get back to healthy diet     Comment Sharon Cervantes is using an air fryer, cutting down on bread and sweets.  She is getting back into Weight Watchers.  She has cottage cheese with no sugar fruit cocktail for snacks. Sharon Cervantes has not been eating well.  With her depression, she has been turing to junk foods.  She wants to get away from it and wants to work on getting back to it.  She is part of [employer] but has not committed to the program yet.  She is going to try to get back to her healthy habits. Sharon Cervantes is trying to get better with her diet.  She is still has not really gotten into weight watchers.  She has cut back on her junk food and really trying to get better.  Her A1c was up some with her blood work last week so she wants to really work on it.     Expected Outcome Short: continue current habits and add more vegetables Long:  get a variety of heart healthy foods in meal plan Short: Get into and commit to [facility] program Long; Get back to healthy diet Short: Continue to cut back on junk food Long: Continue to get healthy diet choices            Nutrition Goals Discharge (Final Nutrition Goals Re-Evaluation):  Nutrition Goals Re-Evaluation - 12/04/20 0948      Goals   Nutrition Goal Short: Get into and commit to [facility] program Long; Get back to healthy  diet    Comment Sharon Cervantes is trying to get better with her diet.  She is still has not really gotten into weight watchers.  She has cut back on her junk food and really trying to get better.  Her A1c was up some with her blood work last week so she wants to really work on it.    Expected Outcome Short: Continue to cut back on junk food Long: Continue to get healthy diet choices           Psychosocial: Target Goals: Acknowledge presence or absence of significant depression and/or stress, maximize coping skills, provide positive support system. Participant is able to verbalize types and ability to use techniques and skills needed for reducing stress and depression.   Education: Stress, Anxiety, and Depression - Group verbal and visual presentation to define topics covered.  Reviews how body is impacted by stress, anxiety, and depression.  Also discusses healthy ways to reduce stress and to treat/manage anxiety and depression.  Written material given at graduation. Flowsheet Row Pulmonary Rehab from 12/04/2020 in [facility]  Date 12/04/20  Educator [facility]  Instruction Review Code 1- [employer] Understanding      Education: Sleep Hygiene -Provides group verbal and written instruction about how sleep can affect your health.  Define sleep hygiene, discuss sleep cycles and impact of sleep habits. Review good sleep hygiene tips.    Initial Review & Psychosocial Screening:   Quality of Life Scores:  Scores of 19 and below usually indicate a poorer quality of life in these areas.  A difference of  2-3 points is a clinically meaningful difference.  A  difference of 2-3 points in the total score of the Quality of Life Index has been associated with significant improvement in overall quality of life, self-image, physical symptoms, and general health in studies assessing change in quality of life.  PHQ-9: Recent Review Flowsheet Data    Depression screen [facility] 2/9 12/04/2020 10/30/2020  09/02/2020 05/13/2020 04/29/2020   Decreased Interest 2 1 1  0 0   Down, Depressed, Hopeless 1 3 1  0 0   PHQ - 2 Score 3 4 2  0 0   Altered sleeping 3 2 3 2 3    Tired, decreased energy 1 3 3 1 1    Change in appetite 3 3 2 1  0   Feeling bad or failure about yourself  1 2 1  0 0   Trouble concentrating 0 2 1 1 1    Moving slowly or fidgety/restless 0 0 0 0 0   Suicidal thoughts 0 0 0 0 0   PHQ-9 Score 11 16 12 5 5    Difficult doing work/chores Somewhat difficult Somewhat difficult Somewhat difficult Somewhat difficult Somewhat difficult     Interpretation of Total Score  Total Score Depression Severity:  1-4 = Minimal depression, 5-9 = Mild depression, 10-14 = Moderate depression, 15-19 = Moderately severe depression, 20-27 = Severe depression   Psychosocial Evaluation and Intervention:   Psychosocial Re-Evaluation:  Psychosocial Re-Evaluation    Row Name 07/08/20 [phone] 10/16/20 0929 10/30/20 0924 12/04/20 0943       Psychosocial Re-Evaluation   Current issues with -- -- History of Depression;Current Psychotropic Meds;Current Depression;Current Sleep Concerns History of Depression;Current Psychotropic Meds;Current Depression;Current Sleep Concerns;Current Stress Concerns    Comments Sharon Cervantes says she has good and bad days as far as depression.  She still is on Effexor.  She has spoken to her Dr about sleep and has a prescirption for trazadone but has only tried it once.  We discussed trying some realxing music if she wakes up to help her get back to sleep. Dell has slept well the past 2 nights.  She has started her CPAP 3 weeks ago.  She is stil on Effexor.  She has felt more depressed in past couple weeks.  She takes medication and uses positive self talk to feel better. Sharon Cervantes is starting Wellbutrin today with effexor and d/c's celexa.  Her PHQ score went up some since Januray. She goes back in 4 weeks and is seeing someone new.  She has not been in rehab for a few weeks with appointments and  oversleeping.  She is sleeping more, but now too much.  She is feeling down and depressed, but can't pinpoint any one trigger.  We will reasess in two weeks. Sharon Cervantes has increased her Wellbutrin to help with her depression to see if there will be a difference.  She is liking her new doctor a lot.  She is now not sleeping much and wakes up and does not go back to bed.  Her PHQ has improved to an 11 from 20 with the new meds.  Getting back to routine and exercise should help with her sleep and new meds.  [place]ering [place] the [employer] from 9-2 some days now as a 13/09/21.    Expected Outcomes Short: try music to help with sleep long: develop better sleep patterns Short: continue to work on using CPAP Long: develop good sleep patterns Short: Try new med out Long: Continue to find the positive and use coping skills. Short: Continue to work on med increase Long: Continue to  focus on positive    Interventions -- Encouraged to attend Pulmonary Rehabilitation for the exercise Encouraged to attend Pulmonary Rehabilitation for the exercise Encouraged to attend Pulmonary Rehabilitation for the exercise    Continue Psychosocial Services  -- Follow up required by staff -- --           Psychosocial Discharge (Final Psychosocial Re-Evaluation):  Psychosocial Re-Evaluation - 12/04/20 0943      Psychosocial Re-Evaluation   Current issues with History of Depression;Current Psychotropic Meds;Current Depression;Current Sleep Concerns;Current Stress Concerns    Comments Sharon Cervantes has increased her Wellbutrin to help with her depression to see if there will be a difference.  She is liking her new doctor a lot.  She is now not sleeping much and wakes up and does not go back to bed.  Her PHQ has improved to an 11 from 4916 with the new meds.  Getting back to routine and exercise should help with her sleep and new meds.  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    Expected Outcomes Short: Continue to work on med  increase Long: Continue to focus on positive    Interventions Encouraged to attend Pulmonary Rehabilitation for the exercise           Education: Education Goals: Education classes will be provided on a weekly basis, covering required topics. Participant will state understanding/return demonstration of topics presented.  Learning Barriers/Preferences:   General Pulmonary Education Topics:  Infection Prevention: - Provides verbal and written material to individual with discussion of infection control including proper hand washing and proper equipment cleaning during exercise session. Flowsheet [facility]  Date 04/29/20  Educator [facility]  Instruction Review Code 1- Verbalizes Understanding      Falls Prevention: - Provides verbal and written material to individual with discussion of falls prevention and safety. Flowsheet Row [facility]  Date 04/29/20  Educator [facility]  Instruction Review Code 1- Verbalizes Understanding      Chronic Lung Disease Review: - Group verbal instruction with posters, models, PowerPoint presentations and videos,  to review new updates, new respiratory medications, new advancements in procedures and treatments. Providing information on websites and "800" numbers for continued self-education. Includes information about supplement oxygen, available portable oxygen systems, continuous and intermittent flow rates, oxygen safety, concentrators, and Medicare reimbursement for oxygen. Explanation of Pulmonary Drugs, including class, frequency, complications, importance of spacers, rinsing mouth after steroid MDI's, and proper cleaning methods for nebulizers. Review of basic lung anatomy and physiology related to function, structure, and complications of lung disease. Review of risk factors. Discussion about methods for diagnosing sleep apnea and types of masks and  machines for OSA. Includes a review of the use of types of environmental controls: home humidity, furnaces, filters, dust mite/pet prevention, HEPA vacuums. Discussion about weather changes, air quality and the benefits of nasal washing. Instruction on Warning signs, infection symptoms, calling MD promptly, preventive modes, and value of vaccinations. Review of effective airway clearance, coughing and/or vibration techniques. Emphasizing that all should Create an Action Plan. Written material given at graduation. Flowsheet Row [facility]  Date 04/29/20  Instruction Review Code 3- Needs Reinforcement  [need identified]      AED/CPR: - Group verbal and written instruction with the use of models to demonstrate the basic use of the AED with the basic ABC's  of resuscitation.    Anatomy and Cardiac Procedures: - Group verbal and visual presentation and models provide information about basic cardiac anatomy and function. Reviews the testing methods done to diagnose heart disease and the outcomes of the test results. Describes the treatment choices: Medical Management, Angioplasty, or Coronary Bypass Surgery for treating various heart conditions including Myocardial Infarction, Angina, Valve Disease, and Cardiac Arrhythmias.  Written material given at graduation.   Medication Safety: - Group verbal and visual instruction to review commonly prescribed medications for heart and lung disease. Reviews the medication, class of the drug, and side effects. Includes the steps to properly store meds and maintain the prescription regimen.  Written material given at graduation.   Other: -Provides group and verbal instruction on various topics (see comments)   Knowledge Questionnaire Score:    Core Components/Risk Factors/Patient Goals at Admission:   Education:Diabetes - Individual verbal and written instruction to review signs/symptoms of diabetes,  desired ranges of glucose level fasting, after meals and with exercise. Acknowledge that pre and post exercise glucose checks will be done for 3 sessions at entry of program.   Know Your Numbers and Heart Failure: - Group verbal and visual instruction to discuss disease risk factors for cardiac and pulmonary disease and treatment options.  Reviews associated critical values for Overweight/Obesity, Hypertension, Cholesterol, and Diabetes.  Discusses basics of heart failure: signs/symptoms and treatments.  Introduces Heart Failure Zone chart for action plan for heart failure.  Written material given at graduation.   Core Components/Risk Factors/Patient Goals Review:   Goals and Risk Factor Review    Row Name 07/08/20 0809 10/16/20 1610 10/30/20 0930 12/04/20 0939       Core Components/Risk Factors/Patient Goals Review   Personal Goals Review Weight Management/Obesity;Improve shortness of breath with ADL's;Hypertension Improve shortness of breath with ADL's;Hypertension Improve shortness of breath with ADL's;Hypertension;Weight Management/Obesity Improve shortness of breath with ADL's;Hypertension;Weight Management/Obesity    Review SOB has been ok lately except when she has a coughing spell.  She is doing PT for knee and was advised not to do TM for now, but can walk on track.  She is taking meds as directed.  She is going to order a BP cuff ffrom [employer] today. Today is Sharon Cervantes's first day back after missing a couple weeks.  SOB has been bad since she had Covid.  She just finished prednisone.  Knee is better and Sharon Cervantes can use TM.  She still has trouble going up steps.  BP was 126/72 today and has been good when she went to Dr. Valentino Saxon is hoping that her new meds will help her with her weight loss.  It was up today as she has not been here.  She was up 14 lb since COVID.   Her breathing is still not where she wants it to be, but she is now getting back to exercise and working on it.  Blood pressures have  been good and she checks them at home on occasion.  She has a cardiologist appt today. Sharon Cervantes saw her doctor on Tuesday and they requested her blood pressure reading to review.  We will fax those over.  Her weight has gone up since being out.  She is now up to 254 lb.  She wants to get control over it again.  They have also increased the wellbutrin which will help too.  She is struggling wiht her breathing with the pollen in the air.    Expected Outcomes Short: order BP cuff, meet with  RD today Long: monitor BP at home, follow RD recommendations Short: get back to regular attendance  Long:  build stamina back Short: Continue to work on weight loss.  Long: Continue to monitor risk factors. Short: Fax over BP for review Long: Work on weight loss again.           Core Components/Risk Factors/Patient Goals at Discharge (Final Review):   Goals and Risk Factor Review - 12/04/20 0939      Core Components/Risk Factors/Patient Goals Review   Personal Goals Review Improve shortness of breath with ADL's;Hypertension;Weight Management/Obesity    Review Sharon Cervantes saw her doctor on Tuesday and they requested her blood pressure reading to review.  We will fax those over.  Her weight has gone up since being out.  She is now up to 254 lb.  She wants to get control over it again.  They have also increased the wellbutrin which will help too.  She is struggling wiht her breathing with the pollen in the air.    Expected Outcomes Short: Fax over BP for review Long: Work on weight loss again.           ITP Comments:  ITP Comments    Row Name 07/22/20 1304 07/22/20 1531 07/30/20 0940 08/06/20 0827 08/27/20 0555   ITP Comments Pt has not attended since last review. Gordon will have surgery for a hernia tomorrow.  She will let staff know when she is cleared to return to exercise. 30 Day review completed. Medical Director ITP review done, changes made as directed, and signed approval by Medical Director. Called to check on  patient.  She saw surgeon yesterday and still having fluid built up around knee.  They want her to stay off her knee for about 4 weeks. They may have to drain it if it doesn't improve.  We will cancel appointments through the end of the year and she hopes to be able to return in January. 30 Day review completed. Medical Director ITP review done, changes made as directed, and signed approval by Medical Director.  Remains out with knee problems   Row Name 09/18/20 1232 09/23/20 1212 09/24/20 0918 10/22/20 0642 10/28/20 1550   ITP Comments Sharon Cervantes called 09/17/20 to let us know that she had tested positive for COVID.  She will be out until 09/30/20 Sharon Cervantes has only been to rehab a couple of times since last review - she is now out with COVID - unable to get goals this round. 30 Day review completed. Medical Director ITP review done, changes made as directed, and signed approval by Medical Director. 30 Day review completed. Medical Director ITP review done, changes made as directed, and signed approval by Medical Director. Called out last and this week, overslept and MD appts   Row Name 11/13/20 1124 11/19/20 0752 11/25/20 0938 12/17/20 1005 12/24/20 1617   ITP Comments Sharon Cervantes has still not been feeling well and has had other appointments.  She has not attended since last review. 30 Day review completed. Medical Director ITP review done, changes made as directed, and signed approval by Medical Director. Tried to call patient to check in.  Mailbox full.  Sent pt message via myChart.  Out since 10/30/20. 30 Day review completed. Medical Director ITP review done, changes made as directed, and signed approval by Medical Director. Sharon Cervantes sent myChart message that her knee was really bothering her today.  She is going to try the arm crank tomorrow to give her knee a break.  Row Name 12/30/20 1418           ITP Comments Pt sent message via myChart that she is moving and will need to discharge from the program.               Comments: Discharge ITP

## 2021-03-10 ENCOUNTER — Other Ambulatory Visit: Payer: Self-pay | Admitting: Pulmonary Disease

## 2021-03-10 DIAGNOSIS — R053 Chronic cough: Secondary | ICD-10-CM

## 2021-03-10 DIAGNOSIS — K219 Gastro-esophageal reflux disease without esophagitis: Secondary | ICD-10-CM

## 2021-03-24 ENCOUNTER — Ambulatory Visit: Admission: RE | Admit: 2021-03-24 | Payer: Medicare Other | Source: Ambulatory Visit

## 2021-04-09 ENCOUNTER — Ambulatory Visit: Payer: Medicare Other

## 2021-04-15 ENCOUNTER — Ambulatory Visit: Payer: Medicare Other

## 2021-05-01 ENCOUNTER — Other Ambulatory Visit: Payer: Self-pay | Admitting: Pulmonary Disease

## 2021-05-01 DIAGNOSIS — K219 Gastro-esophageal reflux disease without esophagitis: Secondary | ICD-10-CM

## 2021-05-08 ENCOUNTER — Other Ambulatory Visit: Payer: Self-pay

## 2021-05-08 ENCOUNTER — Other Ambulatory Visit: Payer: Self-pay | Admitting: Pulmonary Disease

## 2021-05-08 ENCOUNTER — Ambulatory Visit
Admission: RE | Admit: 2021-05-08 | Discharge: 2021-05-08 | Disposition: A | Discharge status: Home / Self Care | Payer: Medicare Other | Source: Ambulatory Visit | Attending: Pulmonary Disease | Admitting: Pulmonary Disease

## 2021-05-08 DIAGNOSIS — K219 Gastro-esophageal reflux disease without esophagitis: Secondary | ICD-10-CM

## 2021-06-29 ENCOUNTER — Ambulatory Visit: Payer: Medicare Other | Admitting: Surgery

## 2021-06-29 ENCOUNTER — Encounter: Payer: Self-pay | Admitting: Surgery

## 2021-06-29 ENCOUNTER — Other Ambulatory Visit: Payer: Self-pay

## 2021-06-29 VITALS — BP 123/83 | HR 83 | Temp 98.6°F | Ht 65.0 in | Wt 259.0 lb

## 2021-06-29 DIAGNOSIS — E66813 Obesity, class 3: Secondary | ICD-10-CM

## 2021-06-29 DIAGNOSIS — R1084 Generalized abdominal pain: Secondary | ICD-10-CM

## 2021-06-29 DIAGNOSIS — Z6841 Body Mass Index (BMI) 40.0 and over, adult: Secondary | ICD-10-CM

## 2021-06-29 DIAGNOSIS — R053 Chronic cough: Secondary | ICD-10-CM

## 2021-06-29 DIAGNOSIS — K449 Diaphragmatic hernia without obstruction or gangrene: Secondary | ICD-10-CM

## 2021-06-29 NOTE — Progress Notes (Signed)
Request for Medical Clearance has been faxed to Dr Laney Potash office.

## 2021-06-29 NOTE — Progress Notes (Signed)
Request for Pulmonary Clearance has been faxed to Dr Terence Lux office.

## 2021-06-29 NOTE — Patient Instructions (Addendum)
We will get you scheduled for a CT scan of your chest and abdomen with contrast.  We will refer you to Dr Tonna Boehringer to have him do an Upper Endoscopy to assess the hiatal hernia. His office will call you to schedule this.   We would like you to loose weight prior to surgery. We have referred you to Medical Weight Management. They will call you to schedule this appointment.   Follow up here in 2 months.

## 2021-06-30 ENCOUNTER — Other Ambulatory Visit: Payer: Self-pay

## 2021-06-30 DIAGNOSIS — R1084 Generalized abdominal pain: Secondary | ICD-10-CM

## 2021-07-01 ENCOUNTER — Telehealth: Payer: Self-pay

## 2021-07-01 NOTE — Progress Notes (Signed)
Patient ID: Sharon Cervantes, female   DOB: [birth year], 70 y.o.   MRN: [id number]  HPI Sharon Cervantes is a 70 y.o. female seen in consultation at the request of Dr.Aleskerov for recalcitrant reflux as well as chronic cough in the presence of a hiatal hernia.  He did have a history of ventral hernia repair with mesh by Dr. Tonna Boehringer last year.  He developed a seroma but currently has healed completely. Patient is some intermittent epigastric pain that is moderate intensity and dull.  He has already had a barium swallow as well as a CT from last year to have personally reviewed.  There is evidence of a moderate to large hiatal hernia type III. Please note that she did have a prior history of Nissen fundoplication several years ago. No records are available. She did have a knee replacement and has intermittent pain CMP and CBC are nml.   HPI  Past Medical History:  Diagnosis Date   Arthritis    Asthma    Chronic cough    Colon polyps    Depression    GERD (gastroesophageal reflux disease)    Hiatal hernia    LARGE   History of chicken pox    Hypertension    Macular degeneration    Multinodular goiter    FNA in past neg h/o thyroid cysts   Pernicious anemia    PERNICIOUS    RLS (restless legs syndrome)    Sleep apnea    NO CPAP    Past Surgical History:  Procedure Laterality Date   APPLICATION OF WOUND VAC Right 02/05/2020   Procedure: APPLICATION OF WOUND VAC;  Surgeon: Kennedy Bucker, MD;  Location: [facility] OR[facility];  Service: Orthopedics;  Laterality: Right;  [id number]   hiatal hernia surgery     2018   NASAL SINUS SURGERY     2018   THYROID SURGERY     cyst removal   TONSILLECTOMY     1961   TOTAL KNEE ARTHROPLASTY Right 02/05/2020   Procedure: RIGHT TOTAL KNEE ARTHROPLASTY;  Surgeon: Kennedy Bucker, MD;  Location: [facility] OR[facility];  Service: Orthopedics;  Laterality: Right;   TUBAL LIGATION     VENTRAL HERNIA REPAIR N/A 07/23/2020   Procedure: HERNIA REPAIR VENTRAL ADULT;  Surgeon: Sung Amabile, DO;   Location: [facility] OR[facility];  Service: General;  Laterality: N/A;    Family History  Problem Relation Age of Onset   Heart disease Mother    Heart disease Father    Hypertension Father    Depression Father    Alcohol abuse Father    Cancer Maternal Grandmother        uterine, skin    Arthritis Maternal Grandmother     Social History Social History   Tobacco Use   Smoking status: Never   Smokeless tobacco: Never  Vaping Use   Vaping Use: Never used  Substance Use Topics   Alcohol use: Not Currently   Drug use: Not Currently    No Known Allergies  Current Outpatient Medications  Medication Sig Dispense Refill   albuterol (PROVENTIL HFA;VENTOLIN HFA) 108 (90 Base) MCG/ACT inhaler Inhale 2 puffs into the lungs every 6 (six) hours as needed for wheezing or shortness of breath.      albuterol (PROVENTIL) (2.5 MG/3ML) 0.083% nebulizer solution Take 2.5 mg by nebulization 2 (two) times daily as needed for wheezing or shortness of breath.     cyanocobalamin (,VITAMIN B-12,) 1000 MCG/ML injection Inject 1,000 mcg into the muscle every 30 (thirty) days.  furosemide (LASIX) 20 MG tablet Take 20 mg by mouth daily as needed for edema.     HYDROcodone-homatropine (HYCODAN) 5-1.5 MG/5ML syrup Take 5 mLs by mouth every 6 (six) hours as needed for cough.     ibuprofen (ADVIL) 200 MG tablet Take 800 mg by mouth every 6 (six) hours as needed for headache or moderate pain.     montelukast (SINGULAIR) 10 MG tablet Take 1 tablet (10 mg total) by mouth at bedtime. appt further refills (Patient taking differently: Take 10 mg by mouth at bedtime.) 90 tablet 1   pramipexole (MIRAPEX) 1.5 MG tablet Take 1.5 mg by mouth at bedtime.      venlafaxine XR (EFFEXOR-XR) 75 MG 24 hr capsule Take 150 mg by mouth daily with breakfast.      budesonide (PULMICORT) 0.5 MG/2ML nebulizer solution Take 0.5 mg by nebulization 2 (two) times daily as needed (shortness of breath or wheezing).      esomeprazole (NEXIUM) 40 MG  capsule Take 40 mg by mouth daily.      gabapentin (NEURONTIN) 300 MG capsule Take 300 mg by mouth 3 (three) times daily.      hydrochlorothiazide (HYDRODIURIL) 12.5 MG tablet Take 12.5 mg by mouth daily.      No current facility-administered medications for this visit.   Facility-Administered Medications Ordered in Other Visits  Medication Dose Route Frequency Provider Last Rate Last Admin   methacholine (PROVOCHOLINE) inhaler solution 0.5 mg  2 mL Inhalation Once Vida Rigger, MD       Followed by   methacholine (PROVOCHOLINE) inhaler solution 2 mg  2 mL Inhalation Once Vida Rigger, MD       Followed by   methacholine (PROVOCHOLINE) inhaler solution 8 mg  2 mL Inhalation Once Vida Rigger, MD       Followed by   methacholine (PROVOCHOLINE) inhaler solution 32 mg  2 mL Inhalation Once Vida Rigger, MD         Review of Systems Full ROS  was asked and was negative except for the information on the HPI  Physical Exam Blood pressure 123/83, pulse 83, temperature 98.6 F (37 C), height 5\' 5"  (1.651 m), weight 259 lb (117.5 kg), SpO2 96 %. CONSTITUTIONAL: NAD. EYES: Pupils are equal, round,  Sclera are non-icteric. EARS, NOSE, MOUTH AND THROAT: She is wearing a mask Hearing is intact to voice. LYMPH NODES:  Lymph nodes in the neck are normal. RESPIRATORY:  Lungs are clear. There is normal respiratory effort, with equal breath sounds bilaterally, and without pathologic use of accessory muscles. CARDIOVASCULAR: Heart is regular without murmurs, gallops, or rubs. GI: The abdomen is  soft, nontender, and nondistended. There are no palpable masses. There is no hepatosplenomegaly. There are normal bowel sounds in all quadrants. GU: Rectal deferred.   MUSCULOSKELETAL: Normal muscle strength and tone. No cyanosis or edema.   SKIN: Turgor is good and there are no pathologic skin lesions or ulcers. NEUROLOGIC: Motor and sensation is grossly normal. Cranial nerves are grossly  intact. PSYCH:  Oriented to person, place and time. Affect is normal.  Data Reviewed  I have personally reviewed the patient's imaging, laboratory findings and medical records.    Assessment/Plan 70 year old female with history of recalcitrant reflux and hiatal hernia presumably recurrent.  She does have a BMI of 43.  We had an extensive discussion regarding her body habitus.  Encouraged her to optimize weight.  We will make appropriate referrals to medical weight loss Clinic.  In the interim she  wishes to pursue further work-up.  We will obtain a repeat CT scan of the abdomen pelvis as well as an upper endoscopy by Dr. Tonna Boehringer. I would like to assess mediastinal anatomy and tailored therapy accordingly. Extensive counseling provided . I spent over 60 min in this encounter including counseling the pt, coordinating her care, placing orders and documenting appropriately in her chart.    Sterling Big, MD FACS General Surgeon 07/01/2021, 3:35 PM

## 2021-07-01 NOTE — Progress Notes (Unsigned)
Pulmonary Clearance has been received from Dr Aleskerov's office. The patient is cleared at Low risk for surgery.  

## 2021-07-01 NOTE — Telephone Encounter (Signed)
Spoke with patient about her imaging and scheduled visit with Dr Lysle Pearl She is scheduled to see Dr Lysle Pearl on 07/07/21 to discuss and schedule to have an EGD done.  She is scheduled for a CT of the chest, abdomen, and pelvis with contrast at [facility] imaging on 07/29/21. Her arrival time is 7:40 am and she will have nothing to eat or drink for 4 hours prior. She will need to pick up a prep kit for this exam. She is aware of dates, times, and instructions.

## 2021-07-10 ENCOUNTER — Telehealth: Payer: Self-pay

## 2021-07-10 NOTE — Telephone Encounter (Signed)
Medical Clearance received from [facility] is optimized for surgery.

## 2021-07-29 ENCOUNTER — Ambulatory Visit: Payer: Medicare Other

## 2021-08-18 ENCOUNTER — Ambulatory Visit: Admission: RE | Admit: 2021-08-18 | Payer: Medicare Other | Source: Ambulatory Visit

## 2021-08-19 ENCOUNTER — Ambulatory Visit: Payer: Medicare Other | Admitting: Surgery

## 2021-09-02 ENCOUNTER — Ambulatory Visit: Payer: Medicare Other | Admitting: Surgery

## 2021-09-07 ENCOUNTER — Ambulatory Visit: Admission: RE | Admit: 2021-09-07 | Payer: Medicare Other | Source: Ambulatory Visit

## 2021-09-24 ENCOUNTER — Ambulatory Visit: Admit: 2021-09-24 | Payer: Medicare Other | Admitting: Surgery

## 2021-09-24 SURGERY — EGD (ESOPHAGOGASTRODUODENOSCOPY)
Anesthesia: General

## 2021-10-05 ENCOUNTER — Ambulatory Visit: Payer: Medicare Other | Admitting: Surgery

## 2022-01-29 ENCOUNTER — Ambulatory Visit
Admission: RE | Admit: 2022-01-29 | Discharge: 2022-01-29 | Disposition: A | Discharge status: Home / Self Care | Payer: Medicare Other | Source: Ambulatory Visit | Attending: Surgery | Admitting: Surgery

## 2022-01-29 DIAGNOSIS — K449 Diaphragmatic hernia without obstruction or gangrene: Secondary | ICD-10-CM | POA: Diagnosis not present

## 2022-01-29 DIAGNOSIS — R1084 Generalized abdominal pain: Secondary | ICD-10-CM | POA: Diagnosis present

## 2022-01-29 LAB — POCT I-STAT CREATININE: Creatinine, Ser: 1.5 mg/dL — ABNORMAL HIGH (ref 0.44–1.00)

## 2022-01-29 MED ORDER — IOHEXOL 300 MG/ML  SOLN
100.0000 mL | Freq: Once | INTRAMUSCULAR | Status: AC | PRN
  Administered 2022-01-29: 80 mL via INTRAVENOUS

## 2022-02-01 ENCOUNTER — Telehealth: Payer: Self-pay

## 2022-02-01 NOTE — Telephone Encounter (Signed)
Patient notified per Dr Dahlia Byes that her CT showed a good size hiatal hernia but no other surprises. She is scheduled to follow up on 02/17/22. Message left for the patient letting her know her results and that Dr Dahlia Byes does have some earlier appointments if she would like to reschedule to an earlier date.

## 2022-02-08 ENCOUNTER — Other Ambulatory Visit: Payer: Self-pay

## 2022-02-08 ENCOUNTER — Ambulatory Visit: Payer: Medicare Other | Admitting: Surgery

## 2022-02-08 ENCOUNTER — Encounter: Payer: Self-pay | Admitting: Surgery

## 2022-02-08 VITALS — Temp 98.3°F | Ht 65.0 in | Wt 263.0 lb

## 2022-02-08 DIAGNOSIS — K449 Diaphragmatic hernia without obstruction or gangrene: Secondary | ICD-10-CM | POA: Diagnosis not present

## 2022-02-08 DIAGNOSIS — Z6841 Body Mass Index (BMI) 40.0 and over, adult: Secondary | ICD-10-CM

## 2022-02-08 NOTE — Progress Notes (Signed)
Outpatient Surgical Follow Up  02/08/2022  Sharon Cervantes is an 71 y.o. female.   Chief Complaint  Patient presents with   Follow-up    Hiatal Hernia-discuss CT    HPI: Sharon Cervantes is a 71 y.o. female following for recalcitrant reflux as well as chronic cough in the presence of a hiatal hernia.  SHe did have a history of ventral hernia repair with mesh by Dr. Tonna Boehringer last year.  H Patient is some intermittent epigastric pain that is moderate intensity and dull.  SHe has already had a barium swallow as well as a CT from last year to have personally reviewed.  There is evidence of a moderate to large hiatal hernia type III.  Is also evidence of an umbilical hernia Please note that she did have a prior history of Nissen fundoplication several years ago Sharon Cervantes. No records are available. She did have a knee replacement and has intermittent pain He also underwent recent endoscopy by Dr. Tonna Boehringer confirming hiatal hernia but no strictures. I have pers. 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  Since my last visit she has actually gained 4 pounds  Past Medical History:  Diagnosis Date   Arthritis    Asthma    Chronic cough    Colon polyps    Depression    GERD (gastroesophageal reflux disease)    Hiatal hernia    LARGE   History of chicken pox    Hypertension    Macular degeneration    Multinodular goiter    FNA in past neg h/o thyroid cysts   Pernicious anemia    PERNICIOUS    RLS (restless legs syndrome)    Sleep apnea    NO CPAP    Past Surgical History:  Procedure Laterality Date   APPLICATION OF WOUND VAC Right 02/05/2020   Procedure: APPLICATION OF WOUND VAC;  Surgeon: Kennedy Bucker, MD;  Location: [facility] OR[facility];  Service: Orthopedics;  Laterality: Right;  [id number]   hiatal hernia surgery     2018   NASAL SINUS SURGERY     2018   THYROID SURGERY     cyst removal   TONSILLECTOMY     1961   TOTAL KNEE  ARTHROPLASTY Right 02/05/2020   Procedure: RIGHT TOTAL KNEE ARTHROPLASTY;  Surgeon: Kennedy Bucker, MD;  Location: [facility] OR[facility];  Service: Orthopedics;  Laterality: Right;   TUBAL LIGATION     VENTRAL HERNIA REPAIR N/A 07/23/2020   Procedure: HERNIA REPAIR VENTRAL ADULT;  Surgeon: Sung Amabile, DO;  Location: [facility] ORS;  Service: General;  Laterality: N/A;    Family History  Problem Relation Age of Onset   Heart disease Mother    Heart disease Father    Hypertension Father    Depression Father    Alcohol abuse Father    Cancer Maternal Grandmother        uterine, skin    Arthritis Maternal Grandmother     Social History:  reports that she has never smoked. She has never used smokeless tobacco. She reports that she does not currently use alcohol. She reports that she does not currently use drugs.  Allergies: No Known Allergies  Medications reviewed.    ROS Full ROS performed and is otherwise negative other than what is stated in HPI   Temp 98.3 F (36.8 C) (Oral)   Ht 5\' 5"  (1.651 m)   Wt 263 lb (119.3 kg)   SpO2 97%   BMI  43.77 kg/m   Physical Exam Vitals and nursing note reviewed. Exam conducted with a chaperone present.  Constitutional:      General: She is not in acute distress.    Appearance: Normal appearance.  Cardiovascular:     Rate and Rhythm: Regular rhythm. Tachycardia present.     Heart sounds: No murmur heard. Pulmonary:     Effort: Pulmonary effort is normal.     Breath sounds: Normal breath sounds.  Abdominal:     General: Abdomen is flat. There is no distension.     Palpations: Abdomen is soft. There is no mass.     Tenderness: There is no abdominal tenderness. There is no guarding or rebound.     Hernia: No hernia is present.  Genitourinary:    General: Normal vulva.  Musculoskeletal:        General: Normal range of motion.     Cervical back: Normal range of motion and neck supple. No rigidity.  Lymphadenopathy:     Cervical: No cervical  adenopathy.  Skin:    General: Skin is warm and dry.     Capillary Refill: Capillary refill takes less than 2 seconds.  Neurological:     General: No focal deficit present.     Mental Status: She is alert and oriented to person, place, and time.  Psychiatric:        Mood and Affect: Mood normal.        Behavior: Behavior normal.        Thought Content: Thought content normal.        Judgment: Judgment normal.    Assessment/Plan: 70 year old female with recurrent paraesophageal hernia.  Her BMI is 43 not optimized for redo paraesophageal hernia repair.  I had another extensive discussion with the patient regarding disease process and her condition.  I will refer to medical weight management to see if they can potentially help her with medications.  She is in agreement and she is motivated to also do some diet and exercise. I think she will need to loose about 43 lbs before we can contemplate repair. Goal 220lbs or less Please note that I spent greater than 40 minutes in this encounter including personally reviewing imaging studies, placing orders, coordinating care care, counseling the patient and performing appropriate documentation  Sterling Big, MD [facility] General Surgeon

## 2022-02-08 NOTE — Patient Instructions (Addendum)
Referral to weight loss management. Someone from their office will call to schedule an appointment. Please see your follow up appointment listed below.

## 2022-02-15 IMAGING — RF DG ESOPHAGUS
10 of 12 series · 14 of 24 positions shown · non-contrast
Comparison: CT abdomen pelvis, 08/27/2020

CLINICAL DATA: Reflux, hoarseness, GERD, history of hiatal hernia
repair

EXAM:
ESOPHOGRAM / BARIUM SWALLOW / BARIUM TABLET STUDY
TECHNIQUE: Combined double contrast and single contrast examination performed
using effervescent crystals, thick barium liquid, and thin barium
liquid. The patient was observed with fluoroscopy swallowing a 13 mm
barium sulphate tablet.
FLUOROSCOPY TIME:  Fluoroscopy Time:  [DATE]
Number of Acquired Spot Images: 59

[Series 1: fluoro_barium 2fps_bw · 0.17mm/px · 2 of 5 frames shown (1 of 10)]
[frame 1/5]
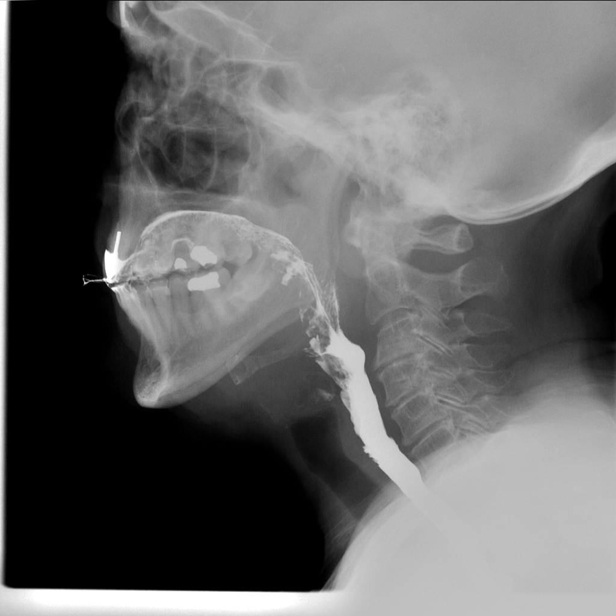
[frame 5/5]
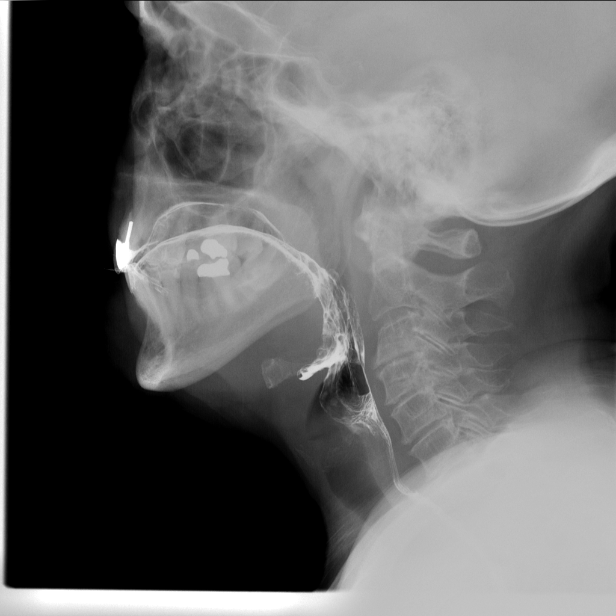

[Series 2: fluoro_barium 2fps_bw · 0.17mm/px · 1 of 9 frames shown (2 of 10)]
[frame 3/9]
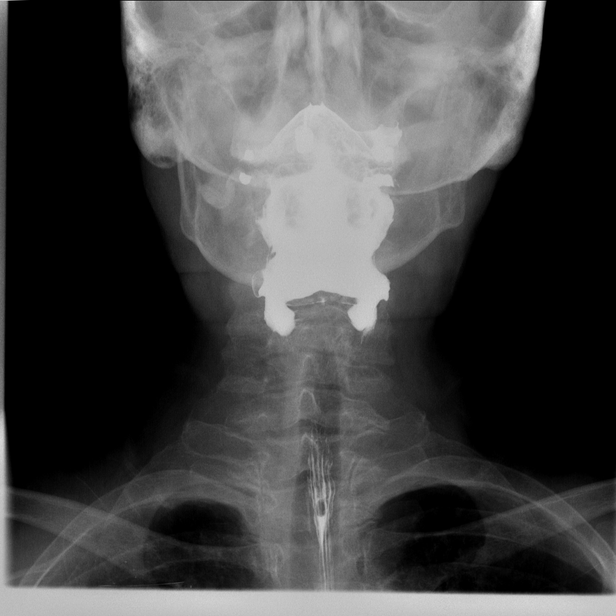

[Series 3: fluoro_barium 2fps_bw · 0.17mm/px · 2 of 18 frames shown (3 of 10)]
[frame 2/18]
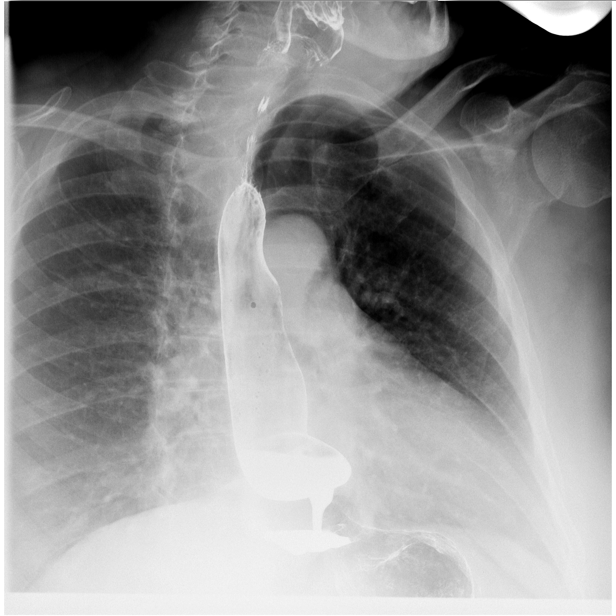
[frame 3/18]
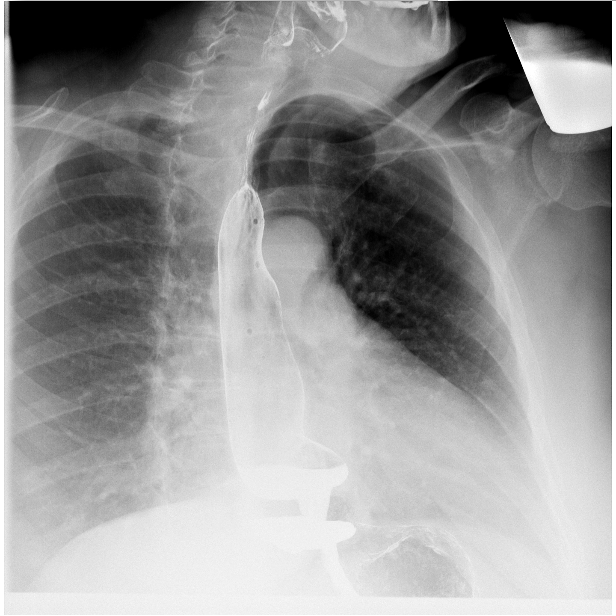

[Series 4: fluoro_barium 2fps_bw · 0.17mm/px · 1 of 5 frames shown (4 of 10)]
[frame 1/5]
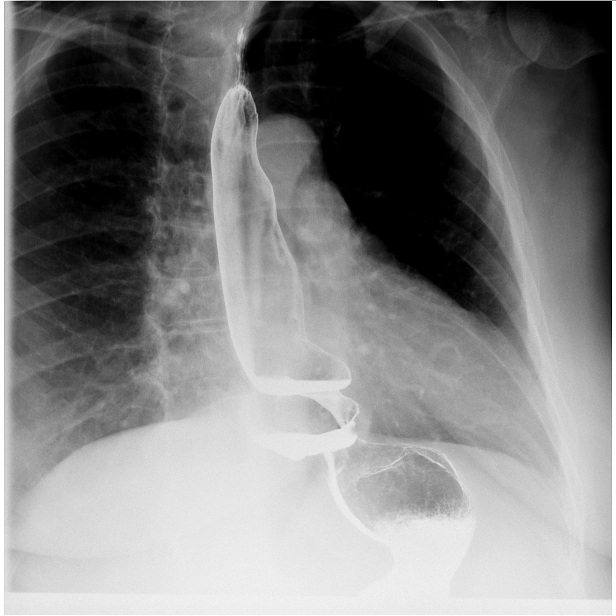

[Series 5: fluoro_barium 2fps_bw · 0.17mm/px · 2 of 3 frames shown (5 of 10)]
[frame 1/3]
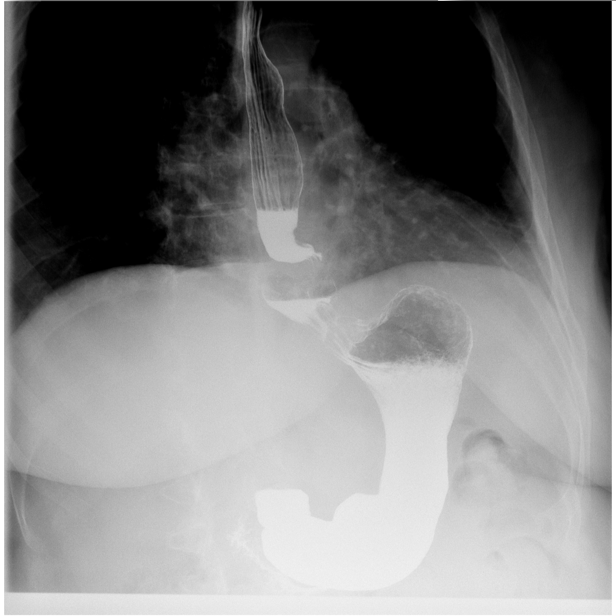
[frame 2/3]
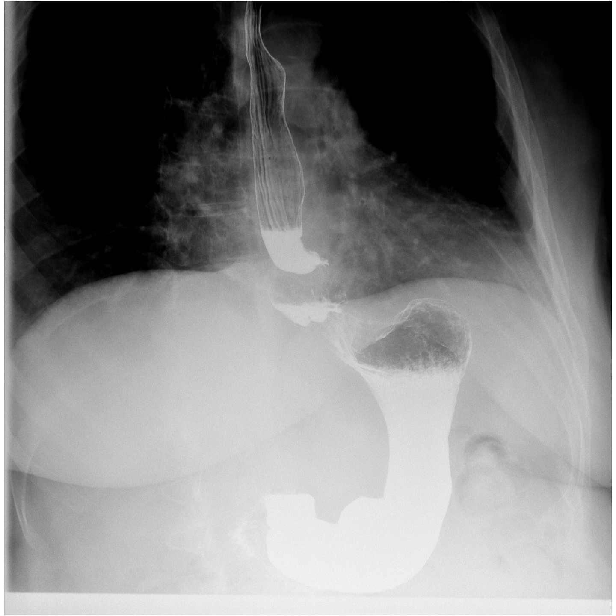

[Series 6: fluoro_barium 2fps_bw · 0.17mm/px · 1 of 2 frames shown (6 of 10)]
[frame 2/2]
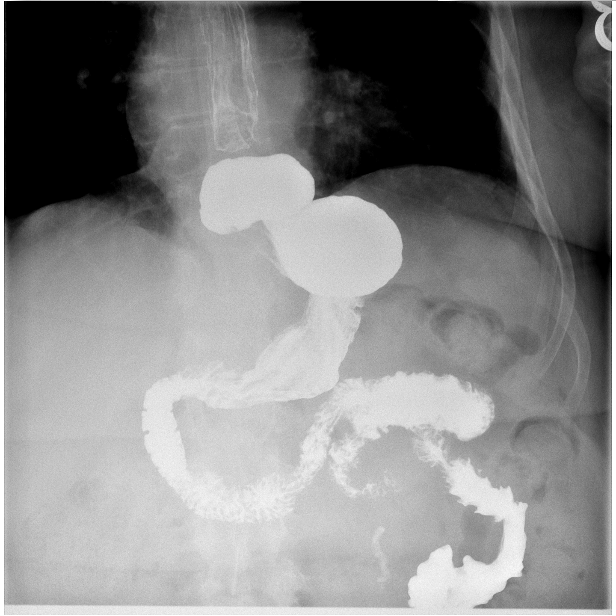

[Series 8: fluoro_barium 2fps_bw · 0.17mm/px · 2 of 9 frames shown (7 of 10)]
[frame 2/9]
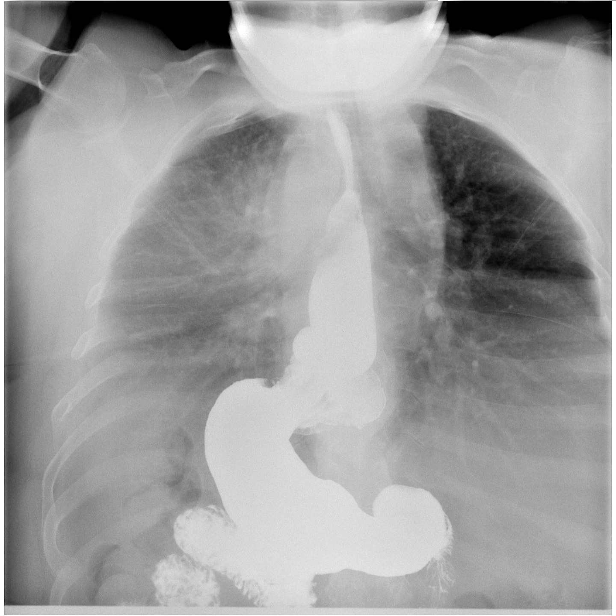
[frame 5/9]
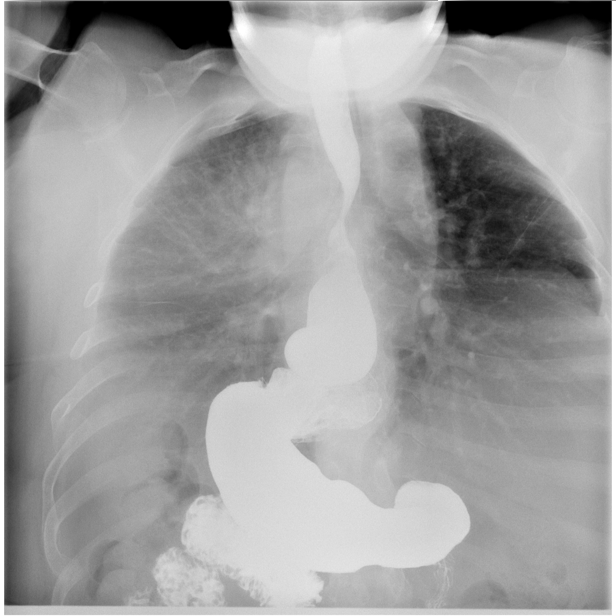

[Series 9: fluoro_barium 2fps_bw · 0.17mm/px · 1 of 2 frames shown (8 of 10)]
[frame 1/2]
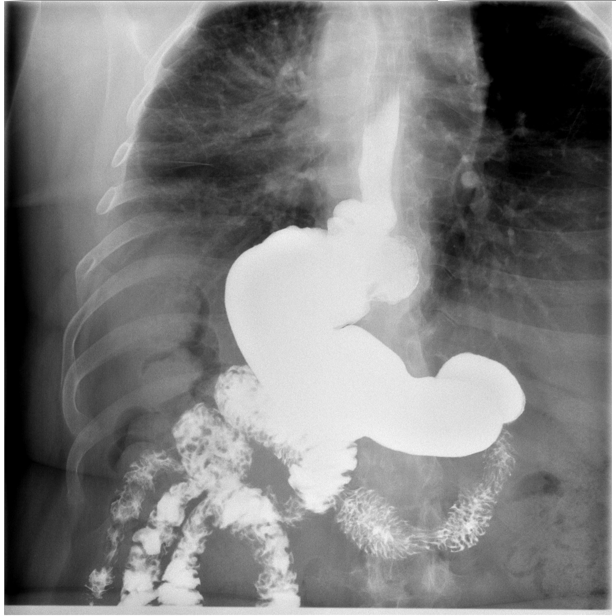

[Series 10: fluoro_barium 2fps_bw · 0.17mm/px · 1 of 2 frames shown (9 of 10)]
[frame 1/2]
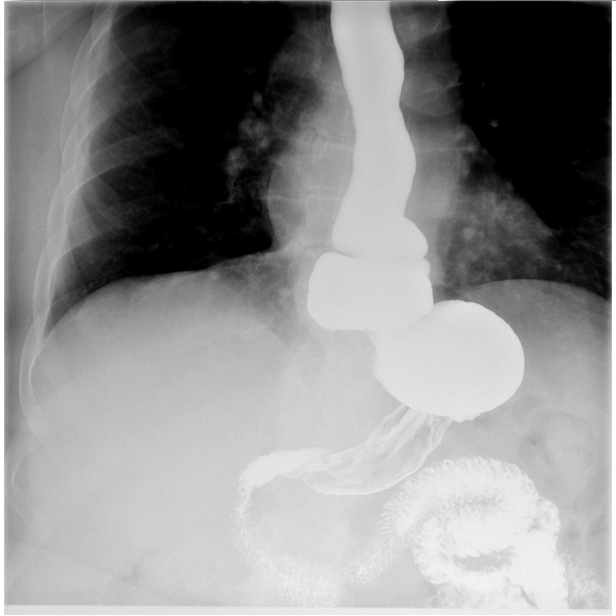

[Series 12: fluoro_barium 2fps_bw · 0.17mm/px · 1 of 1 slices shown (10 of 10)]
[im 1/1]
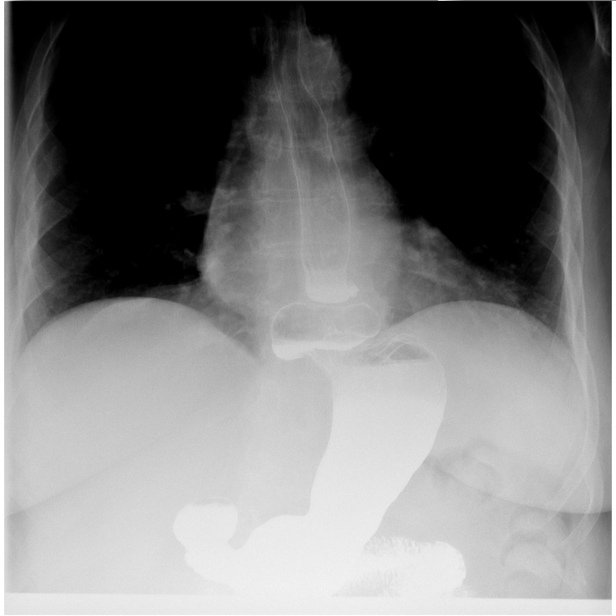

[14 of 24 positions shown; findings below may reference images not displayed]

FINDINGS: Normal oropharyngeal phase of swallow. No evidence of penetration or
aspiration.

The lower esophagus is patulous. There is a moderate hiatal hernia
with intrathoracic position of the gastric fundus. There is
relatively slow transit of enteric contrast through the
gastroesophageal junction. Of note, retention of ingested contrast
in the lower esophagus makes assessment for reflux difficult.

Normal partial double contrast appearance of the stomach and
proximal small bowel. A swallowed barium tablet passes readily
through the gastroesophageal junction.
IMPRESSION: 1. Moderate hiatal hernia with intrathoracic position of the gastric
fundus.

2. Patulous lower esophagus with relatively slow transit of enteric
contrast to the gastropathy or junction. This may be sequelae of
surgical hernia repair or alternately related to a low-grade
stricture of the gastroesophageal junction. Arguing against the
presence of a significant stricture, a 13 mm barium tablet passes
readily.

3.  No evidence of penetration or aspiration.

4. Assessment for reflux during the examination limited due to the
retention of ingested contrast lower esophagus.

## 2022-02-17 ENCOUNTER — Encounter (INDEPENDENT_AMBULATORY_CARE_PROVIDER_SITE_OTHER): Payer: Self-pay

## 2022-02-17 ENCOUNTER — Ambulatory Visit: Payer: Medicare Other | Admitting: Surgery

## 2022-04-07 ENCOUNTER — Encounter (INDEPENDENT_AMBULATORY_CARE_PROVIDER_SITE_OTHER): Payer: Self-pay

## 2022-04-19 ENCOUNTER — Ambulatory Visit: Payer: Medicare Other | Admitting: Surgery

## 2022-04-28 ENCOUNTER — Ambulatory Visit: Payer: Medicare Other | Admitting: Surgery

## 2022-05-10 ENCOUNTER — Ambulatory Visit: Payer: Medicare Other | Admitting: Surgery

## 2024-07-19 LAB — COLOGUARD: COLOGUARD: NEGATIVE

## 2024-10-04 ENCOUNTER — Ambulatory Visit: Admitting: Podiatry

## 2024-10-04 DIAGNOSIS — Q666 Other congenital valgus deformities of feet: Secondary | ICD-10-CM

## 2024-10-04 DIAGNOSIS — M19072 Primary osteoarthritis, left ankle and foot: Secondary | ICD-10-CM

## 2024-10-04 NOTE — Progress Notes (Signed)
 "  Subjective:  Patient ID: Sharon Cervantes, female    DOB: 1951/08/25,  MRN: [id number]  Chief Complaint  Patient presents with   Foot Pain    74 y.o. female presents with the above complaint.  Patient presents with complaint left dorsal midfoot pain hurts with ambulation and shoe pressure patient would like to discuss treatment options for this she states that she has noticed a little bit of support on top of the foot.  She also has seen podiatrist in the past.  She had orthotics done by an in the past.  She would like for me to do orthotics denies any other acute complaints pain scale 7 out of 10 dull aching nature   Review of Systems: Negative except as noted in the HPI. Denies N/V/F/Ch.  Past Medical History:  Diagnosis Date   Arthritis    Asthma    Chronic cough    Colon polyps    Depression    GERD (gastroesophageal reflux disease)    Hiatal hernia    LARGE   History of chicken pox    Hypertension    Macular degeneration    Multinodular goiter    FNA in past neg h/o thyroid cysts   Pernicious anemia    PERNICIOUS    RLS (restless legs syndrome)    Sleep apnea    NO CPAP   Current Medications[1]  Tobacco Use History[2]  Allergies[3] Objective:  There were no vitals filed for this visit. There is no height or weight on file to calculate BMI. Constitutional Well developed. Well nourished.  Vascular Dorsalis pedis pulses palpable bilaterally. Posterior tibial pulses palpable bilaterally. Capillary refill normal to all digits.  No cyanosis or clubbing noted. Pedal hair growth normal.  Neurologic Normal speech. Oriented to person, place, and time. Epicritic sensation to light touch grossly present bilaterally.  Dermatologic Nails well groomed and normal in appearance. No open wounds. No skin lesions.  Orthopedic: Pain on palpation to left dorsal midfoot clinically able to appreciate the underlying arthritic changes.  No extensor flexor tendinitis noted.  Lisfranc  interval is intact.  Pes planovalgus foot structure noted   Radiographs: None Assessment:   1. Arthritis of left midfoot   2. Pes planovalgus    Plan:  Patient was evaluated and treated and all questions answered.  Left dorsal midfoot arthritis - All questions and concerns were discussed with the patient in extensive detail given the amount of pain that she is having should benefit from steroid injection of decrease acute inflammatory component surgical pain.  Patient agrees with plan to proceed with steroid injection -A steroid injection was performed at left dorsal midfoot using 1% plain Lidocaine  and 10 mg of Kenalog. This was well tolerated.  Pes planovalgus/foot deformity -I explained to patient the etiology of pes planovalgus and relationship with heel pain/arch pain and various treatment options were discussed.  Given patient foot structure in the setting of heel pain/arch pain I believe patient will benefit from custom-made orthotics to help control the hindfoot motion support the arch of the foot and take the stress away from arches.  Patient agrees with the plan like to proceed with orthotics -Patient was casted for orthotics    No follow-ups on file.     [1]  Current Outpatient Medications:    albuterol  (PROVENTIL  HFA;VENTOLIN  HFA) 108 (90 Base) MCG/ACT inhaler, Inhale 2 puffs into the lungs every 6 (six) hours as needed for wheezing or shortness of breath. , Disp: , Rfl:  albuterol  (PROVENTIL ) (2.5 MG/3ML) 0.083% nebulizer solution, Take 2.5 mg by nebulization 2 (two) times daily as needed for wheezing or shortness of breath., Disp: , Rfl:    buPROPion (WELLBUTRIN XL) 300 MG 24 hr tablet, Take 300 mg by mouth daily., Disp: , Rfl:    busPIRone (BUSPAR) 10 MG tablet, Take 10 mg by mouth 3 (three) times daily., Disp: , Rfl:    cyanocobalamin  (,VITAMIN B-12,) 1000 MCG/ML injection, Inject 1,000 mcg into the muscle every 30 (thirty) days., Disp: , Rfl:    esomeprazole (NEXIUM)  40 MG capsule, Take 40 mg by mouth daily. , Disp: , Rfl:    furosemide (LASIX) 20 MG tablet, Take 20 mg by mouth daily as needed for edema., Disp: , Rfl:    hydrochlorothiazide  (HYDRODIURIL ) 12.5 MG tablet, Take 12.5 mg by mouth daily. , Disp: , Rfl:    ibuprofen  (ADVIL ) 200 MG tablet, Take 800 mg by mouth every 6 (six) hours as needed for headache or moderate pain., Disp: , Rfl:    methotrexate (RHEUMATREX) 2.5 MG tablet, Take 2.5 mg by mouth once a week. Caution:Chemotherapy. Protect from light., Disp: , Rfl:    montelukast  (SINGULAIR ) 10 MG tablet, Take 1 tablet (10 mg total) by mouth at bedtime. appt further refills (Patient taking differently: Take 10 mg by mouth at bedtime.), Disp: 90 tablet, Rfl: 1   pramipexole  (MIRAPEX ) 1.5 MG tablet, Take 1.5 mg by mouth at bedtime. , Disp: , Rfl:    predniSONE (DELTASONE) 20 MG tablet, Take 20 mg by mouth daily with breakfast., Disp: , Rfl:    venlafaxine  XR (EFFEXOR -XR) 75 MG 24 hr capsule, Take 150 mg by mouth daily with breakfast. , Disp: , Rfl:  No current facility-administered medications for this visit.  Facility-Administered Medications Ordered in Other Visits:    [COMPLETED] sodium chloride  0.9 % nebulizer solution 3 mL, 3 mL, Nebulization, Once, 3 mL at 10/03/18 1130 **FOLLOWED BY** [COMPLETED] methacholine  (PROVOCHOLINE ) inhaler solution 0.125 mg, 2 mL, Inhalation, Once, 0.125 mg at 10/03/18 1139 **FOLLOWED BY** methacholine  (PROVOCHOLINE ) inhaler solution 0.5 mg, 2 mL, Inhalation, Once **FOLLOWED BY** methacholine  (PROVOCHOLINE ) inhaler solution 2 mg, 2 mL, Inhalation, Once **FOLLOWED BY** methacholine  (PROVOCHOLINE ) inhaler solution 8 mg, 2 mL, Inhalation, Once **FOLLOWED BY** methacholine  (PROVOCHOLINE ) inhaler solution 32 mg, 2 mL, Inhalation, Once **FOLLOWED BY** [COMPLETED] albuterol  (PROVENTIL ) (2.5 MG/3ML) 0.083% nebulizer solution 2.5 mg, 2.5 mg, Nebulization, Once, Aleskerov, Fuad, MD, 2.5 mg at 10/03/18 1146 [2]  Social History Tobacco  Use  Smoking Status Never  Smokeless Tobacco Never  [3] No Known Allergies  "
